# Patient Record
Sex: Female | Born: 1965 | Race: Black or African American | Hispanic: No | Marital: Single | Smoking: Current every day smoker
Health system: Southern US, Community
[De-identification: ages and names within clinical notes are randomized; demographics above are authoritative.]

## PROBLEM LIST (undated history)

## (undated) DIAGNOSIS — N951 Menopausal and female climacteric states: Secondary | ICD-10-CM

## (undated) DIAGNOSIS — K219 Gastro-esophageal reflux disease without esophagitis: Secondary | ICD-10-CM

## (undated) DIAGNOSIS — D7589 Other specified diseases of blood and blood-forming organs: Secondary | ICD-10-CM

## (undated) DIAGNOSIS — IMO0001 Reserved for inherently not codable concepts without codable children: Secondary | ICD-10-CM

## (undated) DIAGNOSIS — G43909 Migraine, unspecified, not intractable, without status migrainosus: Secondary | ICD-10-CM

## (undated) DIAGNOSIS — E063 Autoimmune thyroiditis: Secondary | ICD-10-CM

## (undated) DIAGNOSIS — R51 Headache: Secondary | ICD-10-CM

## (undated) DIAGNOSIS — Z720 Tobacco use: Secondary | ICD-10-CM

## (undated) DIAGNOSIS — I10 Essential (primary) hypertension: Secondary | ICD-10-CM

## (undated) DIAGNOSIS — Z8719 Personal history of other diseases of the digestive system: Secondary | ICD-10-CM

## (undated) DIAGNOSIS — K7460 Unspecified cirrhosis of liver: Secondary | ICD-10-CM

## (undated) DIAGNOSIS — M1990 Unspecified osteoarthritis, unspecified site: Secondary | ICD-10-CM

## (undated) DIAGNOSIS — K7581 Nonalcoholic steatohepatitis (NASH): Secondary | ICD-10-CM

## (undated) DIAGNOSIS — H269 Unspecified cataract: Secondary | ICD-10-CM

## (undated) DIAGNOSIS — K649 Unspecified hemorrhoids: Secondary | ICD-10-CM

## (undated) DIAGNOSIS — K754 Autoimmune hepatitis: Secondary | ICD-10-CM

## (undated) DIAGNOSIS — L700 Acne vulgaris: Secondary | ICD-10-CM

## (undated) HISTORY — DX: Gastro-esophageal reflux disease without esophagitis: K21.9

## (undated) HISTORY — PX: DILATION AND CURETTAGE OF UTERUS: SHX78

## (undated) HISTORY — DX: Other specified diseases of blood and blood-forming organs: D75.89

## (undated) HISTORY — DX: Tobacco use: Z72.0

## (undated) HISTORY — PX: ERCP: SHX60

## (undated) HISTORY — DX: Headache: R51

## (undated) HISTORY — PX: TUBAL LIGATION: SHX77

## (undated) HISTORY — DX: Acne vulgaris: L70.0

## (undated) HISTORY — DX: Menopausal and female climacteric states: N95.1

## (undated) HISTORY — DX: Essential (primary) hypertension: I10

## (undated) HISTORY — DX: Personal history of other diseases of the digestive system: Z87.19

## (undated) HISTORY — DX: Autoimmune thyroiditis: E06.3

## (undated) HISTORY — PX: CARPAL TUNNEL RELEASE: SHX101

## (undated) HISTORY — DX: Autoimmune hepatitis: K75.4

## (undated) HISTORY — DX: Unspecified osteoarthritis, unspecified site: M19.90

## (undated) HISTORY — DX: Unspecified hemorrhoids: K64.9

## (undated) HISTORY — DX: Unspecified cataract: H26.9

## (undated) HISTORY — PX: COLONOSCOPY: SHX174

---

## 1990-04-27 HISTORY — PX: CHOLECYSTECTOMY: SHX55

## 1998-06-25 ENCOUNTER — Encounter: Payer: Self-pay | Admitting: Neurology

## 1998-06-25 ENCOUNTER — Ambulatory Visit (HOSPITAL_COMMUNITY): Admission: RE | Admit: 1998-06-25 | Discharge: 1998-06-25 | Payer: Self-pay | Admitting: Neurology

## 1998-07-16 ENCOUNTER — Ambulatory Visit (HOSPITAL_COMMUNITY): Admission: RE | Admit: 1998-07-16 | Discharge: 1998-07-16 | Payer: Self-pay | Admitting: Neurology

## 1998-07-16 ENCOUNTER — Encounter: Payer: Self-pay | Admitting: Neurology

## 1999-05-14 ENCOUNTER — Encounter: Admission: RE | Admit: 1999-05-14 | Discharge: 1999-05-14 | Payer: Self-pay | Admitting: Internal Medicine

## 1999-05-17 ENCOUNTER — Emergency Department (HOSPITAL_COMMUNITY): Admission: EM | Admit: 1999-05-17 | Discharge: 1999-05-17 | Payer: Self-pay

## 1999-10-28 ENCOUNTER — Encounter: Admission: RE | Admit: 1999-10-28 | Discharge: 1999-10-28 | Payer: Self-pay

## 1999-11-25 ENCOUNTER — Encounter: Admission: RE | Admit: 1999-11-25 | Discharge: 1999-11-25 | Payer: Self-pay | Admitting: Obstetrics

## 2000-10-07 ENCOUNTER — Encounter: Admission: RE | Admit: 2000-10-07 | Discharge: 2000-10-07 | Payer: Self-pay | Admitting: Internal Medicine

## 2000-10-14 ENCOUNTER — Other Ambulatory Visit: Admission: RE | Admit: 2000-10-14 | Discharge: 2000-10-14 | Payer: Self-pay | Admitting: Sports Medicine

## 2000-10-14 ENCOUNTER — Encounter: Admission: RE | Admit: 2000-10-14 | Discharge: 2000-10-14 | Payer: Self-pay | Admitting: Internal Medicine

## 2001-09-14 ENCOUNTER — Encounter: Admission: RE | Admit: 2001-09-14 | Discharge: 2001-09-14 | Payer: Self-pay | Admitting: Internal Medicine

## 2001-09-16 ENCOUNTER — Ambulatory Visit (HOSPITAL_COMMUNITY): Admission: RE | Admit: 2001-09-16 | Discharge: 2001-09-16 | Payer: Self-pay | Admitting: Internal Medicine

## 2001-09-16 ENCOUNTER — Encounter: Payer: Self-pay | Admitting: Internal Medicine

## 2001-10-03 ENCOUNTER — Encounter: Admission: RE | Admit: 2001-10-03 | Discharge: 2001-10-03 | Payer: Self-pay | Admitting: Internal Medicine

## 2002-05-25 ENCOUNTER — Encounter: Admission: RE | Admit: 2002-05-25 | Discharge: 2002-05-25 | Payer: Self-pay | Admitting: Internal Medicine

## 2002-07-31 ENCOUNTER — Encounter: Admission: RE | Admit: 2002-07-31 | Discharge: 2002-07-31 | Payer: Self-pay | Admitting: Internal Medicine

## 2005-01-08 ENCOUNTER — Emergency Department (HOSPITAL_COMMUNITY): Admission: EM | Admit: 2005-01-08 | Discharge: 2005-01-09 | Payer: Self-pay | Admitting: Emergency Medicine

## 2005-01-28 ENCOUNTER — Ambulatory Visit (HOSPITAL_COMMUNITY): Admission: RE | Admit: 2005-01-28 | Discharge: 2005-01-28 | Payer: Self-pay | Admitting: Obstetrics

## 2005-05-05 ENCOUNTER — Ambulatory Visit: Payer: Self-pay | Admitting: Internal Medicine

## 2006-04-02 ENCOUNTER — Ambulatory Visit (HOSPITAL_COMMUNITY): Admission: RE | Admit: 2006-04-02 | Discharge: 2006-04-02 | Payer: Self-pay | Admitting: Internal Medicine

## 2006-04-13 DIAGNOSIS — K219 Gastro-esophageal reflux disease without esophagitis: Secondary | ICD-10-CM

## 2006-04-13 DIAGNOSIS — I10 Essential (primary) hypertension: Secondary | ICD-10-CM

## 2006-04-15 ENCOUNTER — Ambulatory Visit (HOSPITAL_COMMUNITY): Admission: RE | Admit: 2006-04-15 | Discharge: 2006-04-15 | Payer: Self-pay | Admitting: Obstetrics

## 2006-04-16 ENCOUNTER — Ambulatory Visit: Payer: Self-pay | Admitting: Internal Medicine

## 2006-04-30 DIAGNOSIS — R51 Headache: Secondary | ICD-10-CM | POA: Insufficient documentation

## 2006-04-30 DIAGNOSIS — R519 Headache, unspecified: Secondary | ICD-10-CM | POA: Insufficient documentation

## 2006-05-10 ENCOUNTER — Ambulatory Visit: Payer: Self-pay | Admitting: Gastroenterology

## 2006-05-10 LAB — CONVERTED CEMR LAB
AST: 27 units/L (ref 0–37)
Albumin: 4.1 g/dL (ref 3.5–5.2)
BUN: 13 mg/dL (ref 6–23)
CO2: 28 meq/L (ref 19–32)
Calcium: 9.3 mg/dL (ref 8.4–10.5)
Chloride: 106 meq/L (ref 96–112)
Creatinine, Ser: 1.1 mg/dL (ref 0.4–1.2)
Glucose, Bld: 96 mg/dL (ref 70–99)
HCT: 38.3 % (ref 36.0–46.0)
MCHC: 34.2 g/dL (ref 30.0–36.0)
MCV: 101.5 fL — ABNORMAL HIGH (ref 78.0–100.0)
Platelets: 405 10*3/uL — ABNORMAL HIGH (ref 150–400)
Total Protein: 7 g/dL (ref 6.0–8.3)
WBC: 7.8 10*3/uL (ref 4.5–10.5)

## 2006-06-07 ENCOUNTER — Ambulatory Visit: Payer: Self-pay | Admitting: Gastroenterology

## 2006-06-07 ENCOUNTER — Encounter (INDEPENDENT_AMBULATORY_CARE_PROVIDER_SITE_OTHER): Payer: Self-pay | Admitting: *Deleted

## 2006-07-15 ENCOUNTER — Ambulatory Visit: Payer: Self-pay | Admitting: Internal Medicine

## 2006-07-15 ENCOUNTER — Telehealth: Payer: Self-pay | Admitting: *Deleted

## 2006-07-15 DIAGNOSIS — N949 Unspecified condition associated with female genital organs and menstrual cycle: Secondary | ICD-10-CM

## 2006-07-15 DIAGNOSIS — N938 Other specified abnormal uterine and vaginal bleeding: Secondary | ICD-10-CM | POA: Insufficient documentation

## 2006-07-15 LAB — CONVERTED CEMR LAB
Alkaline Phosphatase: 60 units/L (ref 39–117)
Amphetamine Screen, Ur: NEGATIVE
Basophils Relative: 0 % (ref 0–1)
Benzodiazepines.: NEGATIVE
Beta hcg, urine, semiquantitative: NEGATIVE
Blood in Urine, dipstick: NEGATIVE
CO2: 30 meq/L (ref 19–32)
Creatinine, Ser: 0.89 mg/dL (ref 0.40–1.20)
Eosinophils Relative: 0 % (ref 0–5)
FSH: 71.3 milliintl units/mL
Glucose, Bld: 109 mg/dL — ABNORMAL HIGH (ref 70–99)
Glucose, Urine, Semiquant: NEGATIVE
HCT: 39.1 % (ref 36.0–46.0)
Ketones, urine, test strip: NEGATIVE
LH: 37.5 milliintl units/mL
Lymphs Abs: 1.1 10*3/uL (ref 0.7–3.3)
MCV: 100.3 fL — ABNORMAL HIGH (ref 78.0–100.0)
Marijuana Metabolite: POSITIVE — AB
Methadone: NEGATIVE
Monocytes Absolute: 0.3 10*3/uL (ref 0.2–0.7)
Monocytes Relative: 4 % (ref 3–11)
Neutrophils Relative %: 84 % — ABNORMAL HIGH (ref 43–77)
Nitrite: NEGATIVE
Opiates: NEGATIVE
Phencyclidine (PCP): NEGATIVE
RBC: 3.9 M/uL (ref 3.87–5.11)
RDW: 14 % (ref 11.5–14.0)
Sodium: 138 meq/L (ref 135–145)
Total Bilirubin: 0.8 mg/dL (ref 0.3–1.2)
Urobilinogen, UA: 0.2
WBC Urine, dipstick: NEGATIVE
pH: 7

## 2006-07-19 DIAGNOSIS — D539 Nutritional anemia, unspecified: Secondary | ICD-10-CM | POA: Insufficient documentation

## 2006-07-19 DIAGNOSIS — E0590 Thyrotoxicosis, unspecified without thyrotoxic crisis or storm: Secondary | ICD-10-CM | POA: Insufficient documentation

## 2006-07-29 ENCOUNTER — Encounter (INDEPENDENT_AMBULATORY_CARE_PROVIDER_SITE_OTHER): Payer: Self-pay | Admitting: Internal Medicine

## 2006-07-29 ENCOUNTER — Ambulatory Visit: Payer: Self-pay | Admitting: *Deleted

## 2006-07-29 DIAGNOSIS — N951 Menopausal and female climacteric states: Secondary | ICD-10-CM

## 2006-07-29 LAB — CONVERTED CEMR LAB: Thyroglobulin Ab: <30 (ref 0.0–60.0)

## 2006-07-30 ENCOUNTER — Telehealth: Payer: Self-pay | Admitting: *Deleted

## 2006-08-09 ENCOUNTER — Encounter (HOSPITAL_COMMUNITY): Admission: RE | Admit: 2006-08-09 | Discharge: 2006-08-10 | Payer: Self-pay | Admitting: *Deleted

## 2006-08-17 ENCOUNTER — Encounter (INDEPENDENT_AMBULATORY_CARE_PROVIDER_SITE_OTHER): Payer: Self-pay | Admitting: *Deleted

## 2006-08-17 DIAGNOSIS — E063 Autoimmune thyroiditis: Secondary | ICD-10-CM | POA: Insufficient documentation

## 2006-08-18 ENCOUNTER — Encounter (INDEPENDENT_AMBULATORY_CARE_PROVIDER_SITE_OTHER): Payer: Self-pay | Admitting: Internal Medicine

## 2006-09-27 ENCOUNTER — Emergency Department (HOSPITAL_COMMUNITY): Admission: EM | Admit: 2006-09-27 | Discharge: 2006-09-27 | Payer: Self-pay | Admitting: Emergency Medicine

## 2006-09-30 ENCOUNTER — Encounter (INDEPENDENT_AMBULATORY_CARE_PROVIDER_SITE_OTHER): Payer: Self-pay | Admitting: Pulmonary Disease

## 2006-09-30 ENCOUNTER — Ambulatory Visit: Payer: Self-pay | Admitting: Internal Medicine

## 2006-09-30 LAB — CONVERTED CEMR LAB
Basophils Absolute: 0 10*3/uL (ref 0.0–0.1)
Basophils Relative: 1 % (ref 0–1)
Eosinophils Absolute: 0.1 10*3/uL (ref 0.0–0.7)
HCT: 39.1 % (ref 36.0–46.0)
Hemoglobin: 12.4 g/dL (ref 12.0–15.0)
MCHC: 31.7 g/dL (ref 30.0–36.0)
MCV: 102.6 fL — ABNORMAL HIGH (ref 78.0–100.0)
Monocytes Absolute: 0.7 10*3/uL (ref 0.2–0.7)
Platelets: 408 10*3/uL — ABNORMAL HIGH (ref 150–400)
RBC: 3.81 M/uL — ABNORMAL LOW (ref 3.87–5.11)
RDW: 14.5 % — ABNORMAL HIGH (ref 11.5–14.0)

## 2007-03-06 ENCOUNTER — Emergency Department (HOSPITAL_COMMUNITY): Admission: EM | Admit: 2007-03-06 | Discharge: 2007-03-06 | Payer: Self-pay | Admitting: Emergency Medicine

## 2007-04-14 ENCOUNTER — Emergency Department (HOSPITAL_COMMUNITY): Admission: EM | Admit: 2007-04-14 | Discharge: 2007-04-14 | Payer: Self-pay | Admitting: Emergency Medicine

## 2007-06-02 ENCOUNTER — Ambulatory Visit (HOSPITAL_COMMUNITY): Admission: RE | Admit: 2007-06-02 | Discharge: 2007-06-02 | Payer: Self-pay | Admitting: Obstetrics

## 2007-06-08 ENCOUNTER — Encounter: Payer: Self-pay | Admitting: Gastroenterology

## 2007-09-19 ENCOUNTER — Emergency Department (HOSPITAL_COMMUNITY): Admission: EM | Admit: 2007-09-19 | Discharge: 2007-09-19 | Payer: Self-pay | Admitting: Family Medicine

## 2009-01-23 ENCOUNTER — Emergency Department (HOSPITAL_COMMUNITY): Admission: EM | Admit: 2009-01-23 | Discharge: 2009-01-23 | Payer: Self-pay | Admitting: Family Medicine

## 2009-01-23 ENCOUNTER — Ambulatory Visit: Payer: Self-pay | Admitting: Internal Medicine

## 2009-01-24 ENCOUNTER — Inpatient Hospital Stay (HOSPITAL_COMMUNITY): Admission: EM | Admit: 2009-01-24 | Discharge: 2009-01-25 | Payer: Self-pay | Admitting: Emergency Medicine

## 2009-01-24 ENCOUNTER — Encounter (INDEPENDENT_AMBULATORY_CARE_PROVIDER_SITE_OTHER): Payer: Self-pay | Admitting: *Deleted

## 2009-01-25 ENCOUNTER — Encounter (INDEPENDENT_AMBULATORY_CARE_PROVIDER_SITE_OTHER): Payer: Self-pay | Admitting: *Deleted

## 2009-03-08 ENCOUNTER — Ambulatory Visit: Payer: Self-pay | Admitting: Gastroenterology

## 2009-03-08 DIAGNOSIS — R740 Nonspecific elevation of levels of transaminase and lactic acid dehydrogenase [LDH]: Secondary | ICD-10-CM

## 2009-03-08 LAB — CONVERTED CEMR LAB
AST: 512 units/L — ABNORMAL HIGH (ref 0–37)
Alkaline Phosphatase: 292 units/L — ABNORMAL HIGH (ref 39–117)
BUN: 6 mg/dL (ref 6–23)
CO2: 29 meq/L (ref 19–32)
Chloride: 104 meq/L (ref 96–112)
Creatinine, Ser: 0.8 mg/dL (ref 0.4–1.2)
GFR calc non Af Amer: 100.45 mL/min (ref >60)
Glucose, Bld: 111 mg/dL — ABNORMAL HIGH (ref 70–99)
HCT: 39.4 % (ref 36.0–46.0)
Lymphocytes Relative: 35.9 % (ref 12.0–46.0)
MCHC: 34.3 g/dL (ref 30.0–36.0)
Monocytes Relative: 7.7 % (ref 3.0–12.0)
RBC: 3.63 M/uL — ABNORMAL LOW (ref 3.87–5.11)
Total Bilirubin: 5.4 mg/dL — ABNORMAL HIGH (ref 0.3–1.2)
Total Protein: 8.2 g/dL (ref 6.0–8.3)
WBC: 6.5 10*3/uL (ref 4.5–10.5)

## 2009-03-11 LAB — CONVERTED CEMR LAB
Hep B C IgM: NEGATIVE
Hep B S Ab: NEGATIVE
Hepatitis B Surface Ag: NEGATIVE

## 2009-03-12 ENCOUNTER — Ambulatory Visit (HOSPITAL_COMMUNITY): Admission: RE | Admit: 2009-03-12 | Discharge: 2009-03-12 | Payer: Self-pay | Admitting: Gastroenterology

## 2009-03-18 ENCOUNTER — Ambulatory Visit: Payer: Self-pay | Admitting: Gastroenterology

## 2009-03-18 ENCOUNTER — Telehealth: Payer: Self-pay | Admitting: Gastroenterology

## 2009-03-18 LAB — CONVERTED CEMR LAB
AST: 636 units/L — ABNORMAL HIGH (ref 0–37)
Albumin: 2.9 g/dL — ABNORMAL LOW (ref 3.5–5.2)
Bilirubin, Direct: 4.4 mg/dL — ABNORMAL HIGH (ref 0.0–0.3)
Prothrombin Time: 13.2 s — ABNORMAL HIGH (ref 9.1–11.7)

## 2009-04-04 ENCOUNTER — Encounter (INDEPENDENT_AMBULATORY_CARE_PROVIDER_SITE_OTHER): Payer: Self-pay | Admitting: *Deleted

## 2009-04-04 ENCOUNTER — Ambulatory Visit (HOSPITAL_COMMUNITY): Admission: RE | Admit: 2009-04-04 | Discharge: 2009-04-04 | Payer: Self-pay | Admitting: Gastroenterology

## 2009-04-09 ENCOUNTER — Telehealth: Payer: Self-pay | Admitting: Gastroenterology

## 2009-04-12 ENCOUNTER — Ambulatory Visit: Payer: Self-pay | Admitting: Gastroenterology

## 2009-04-12 LAB — CONVERTED CEMR LAB
Albumin: 2.8 g/dL — ABNORMAL LOW (ref 3.5–5.2)
Alkaline Phosphatase: 219 units/L — ABNORMAL HIGH (ref 39–117)
Bilirubin, Direct: 1.4 mg/dL — ABNORMAL HIGH (ref 0.0–0.3)

## 2009-04-16 ENCOUNTER — Telehealth: Payer: Self-pay | Admitting: Gastroenterology

## 2009-05-03 ENCOUNTER — Telehealth: Payer: Self-pay | Admitting: Gastroenterology

## 2009-05-03 DIAGNOSIS — K7460 Unspecified cirrhosis of liver: Secondary | ICD-10-CM | POA: Insufficient documentation

## 2009-05-04 ENCOUNTER — Emergency Department (HOSPITAL_COMMUNITY): Admission: EM | Admit: 2009-05-04 | Discharge: 2009-05-04 | Payer: Self-pay | Admitting: Emergency Medicine

## 2009-05-06 ENCOUNTER — Telehealth: Payer: Self-pay | Admitting: Gastroenterology

## 2009-05-08 ENCOUNTER — Ambulatory Visit: Payer: Self-pay | Admitting: Gastroenterology

## 2009-05-09 LAB — CONVERTED CEMR LAB
AST: 71 units/L — ABNORMAL HIGH (ref 0–37)
Alkaline Phosphatase: 148 units/L — ABNORMAL HIGH (ref 39–117)
Total Bilirubin: 1.5 mg/dL — ABNORMAL HIGH (ref 0.3–1.2)

## 2009-05-10 ENCOUNTER — Ambulatory Visit: Payer: Self-pay | Admitting: Gastroenterology

## 2009-05-20 ENCOUNTER — Telehealth: Payer: Self-pay | Admitting: Gastroenterology

## 2009-05-21 ENCOUNTER — Ambulatory Visit: Payer: Self-pay | Admitting: Gastroenterology

## 2009-05-23 LAB — CONVERTED CEMR LAB
Alkaline Phosphatase: 135 units/L — ABNORMAL HIGH (ref 39–117)
Basophils Absolute: 0 10*3/uL (ref 0.0–0.1)
Eosinophils Relative: 2.9 % (ref 0.0–5.0)
Glucose, Bld: 94 mg/dL (ref 70–99)
HCT: 40.6 % (ref 36.0–46.0)
Lymphs Abs: 1.2 10*3/uL (ref 0.7–4.0)
MCV: 111.9 fL — ABNORMAL HIGH (ref 78.0–100.0)
Monocytes Absolute: 0.6 10*3/uL (ref 0.1–1.0)
Platelets: 264 10*3/uL (ref 150.0–400.0)
RDW: 14.4 % (ref 11.5–14.6)
Sodium: 141 meq/L (ref 135–145)
Total Bilirubin: 1.4 mg/dL — ABNORMAL HIGH (ref 0.3–1.2)
Total Protein: 7 g/dL (ref 6.0–8.3)

## 2009-05-24 ENCOUNTER — Telehealth: Payer: Self-pay | Admitting: Gastroenterology

## 2009-05-28 ENCOUNTER — Ambulatory Visit: Payer: Self-pay | Admitting: Gastroenterology

## 2009-05-28 ENCOUNTER — Emergency Department (HOSPITAL_COMMUNITY): Admission: EM | Admit: 2009-05-28 | Discharge: 2009-05-28 | Payer: Self-pay | Admitting: Emergency Medicine

## 2009-05-28 LAB — CONVERTED CEMR LAB
Albumin: 3.5 g/dL (ref 3.5–5.2)
Basophils Absolute: 0 10*3/uL (ref 0.0–0.1)
CO2: 31 meq/L (ref 19–32)
Calcium: 9.1 mg/dL (ref 8.4–10.5)
Chloride: 103 meq/L (ref 96–112)
Eosinophils Relative: 3.6 % (ref 0.0–5.0)
GFR calc non Af Amer: 100.35 mL/min (ref >60)
Glucose, Bld: 103 mg/dL — ABNORMAL HIGH (ref 70–99)
Lymphocytes Relative: 35.5 % (ref 12.0–46.0)
Monocytes Relative: 7.7 % (ref 3.0–12.0)
Neutrophils Relative %: 53.2 % (ref 43.0–77.0)
Platelets: 247 10*3/uL (ref 150.0–400.0)
Potassium: 3.5 meq/L (ref 3.5–5.1)
Sodium: 141 meq/L (ref 135–145)
Total Bilirubin: 1.7 mg/dL — ABNORMAL HIGH (ref 0.3–1.2)
Total Protein: 7.3 g/dL (ref 6.0–8.3)
WBC: 5.9 10*3/uL (ref 4.5–10.5)

## 2009-06-03 ENCOUNTER — Telehealth (INDEPENDENT_AMBULATORY_CARE_PROVIDER_SITE_OTHER): Payer: Self-pay | Admitting: *Deleted

## 2009-06-05 ENCOUNTER — Telehealth: Payer: Self-pay | Admitting: Gastroenterology

## 2009-06-06 ENCOUNTER — Ambulatory Visit: Payer: Self-pay | Admitting: Gastroenterology

## 2009-06-06 ENCOUNTER — Telehealth (INDEPENDENT_AMBULATORY_CARE_PROVIDER_SITE_OTHER): Payer: Self-pay | Admitting: *Deleted

## 2009-06-06 LAB — CONVERTED CEMR LAB
AST: 53 units/L — ABNORMAL HIGH (ref 0–37)
Alkaline Phosphatase: 119 units/L — ABNORMAL HIGH (ref 39–117)
BUN: 10 mg/dL (ref 6–23)
Basophils Relative: 1.1 % (ref 0.0–3.0)
Calcium: 9 mg/dL (ref 8.4–10.5)
Chloride: 105 meq/L (ref 96–112)
Creatinine, Ser: 0.8 mg/dL (ref 0.4–1.2)
Eosinophils Absolute: 0.1 10*3/uL (ref 0.0–0.7)
HCT: 38.9 % (ref 36.0–46.0)
Hemoglobin: 12.8 g/dL (ref 12.0–15.0)
Lymphocytes Relative: 12 % (ref 12.0–46.0)
MCHC: 32.9 g/dL (ref 30.0–36.0)
Neutro Abs: 5.9 10*3/uL (ref 1.4–7.7)
RBC: 3.49 M/uL — ABNORMAL LOW (ref 3.87–5.11)
Sodium: 141 meq/L (ref 135–145)
Total Bilirubin: 1.3 mg/dL — ABNORMAL HIGH (ref 0.3–1.2)

## 2009-06-07 ENCOUNTER — Telehealth: Payer: Self-pay | Admitting: Gastroenterology

## 2009-06-17 ENCOUNTER — Ambulatory Visit: Payer: Self-pay | Admitting: Gastroenterology

## 2009-06-17 LAB — CONVERTED CEMR LAB
AST: 45 units/L — ABNORMAL HIGH (ref 0–37)
Albumin: 3.5 g/dL (ref 3.5–5.2)
Alkaline Phosphatase: 99 units/L (ref 39–117)
Total Protein: 7.1 g/dL (ref 6.0–8.3)

## 2009-06-18 ENCOUNTER — Encounter: Payer: Self-pay | Admitting: Internal Medicine

## 2009-06-18 ENCOUNTER — Ambulatory Visit: Payer: Self-pay | Admitting: Internal Medicine

## 2009-06-18 LAB — HM PAP SMEAR: HM Pap smear: NEGATIVE

## 2009-06-25 ENCOUNTER — Encounter: Payer: Self-pay | Admitting: Gastroenterology

## 2009-06-27 ENCOUNTER — Telehealth: Payer: Self-pay | Admitting: Gastroenterology

## 2009-07-01 ENCOUNTER — Ambulatory Visit (HOSPITAL_COMMUNITY): Admission: RE | Admit: 2009-07-01 | Discharge: 2009-07-01 | Payer: Self-pay | Admitting: Internal Medicine

## 2009-07-01 LAB — HM MAMMOGRAPHY: HM Mammogram: NEGATIVE

## 2009-07-08 ENCOUNTER — Telehealth: Payer: Self-pay | Admitting: Gastroenterology

## 2009-07-26 ENCOUNTER — Ambulatory Visit: Payer: Self-pay | Admitting: Gastroenterology

## 2009-07-26 LAB — CONVERTED CEMR LAB
AST: 43 units/L — ABNORMAL HIGH (ref 0–37)
Alkaline Phosphatase: 76 units/L (ref 39–117)
BUN: 12 mg/dL (ref 6–23)
Creatinine, Ser: 0.9 mg/dL (ref 0.4–1.2)
Potassium: 3.4 meq/L — ABNORMAL LOW (ref 3.5–5.1)

## 2009-08-07 ENCOUNTER — Telehealth: Payer: Self-pay | Admitting: Gastroenterology

## 2009-08-19 ENCOUNTER — Ambulatory Visit: Payer: Self-pay | Admitting: Internal Medicine

## 2009-08-19 ENCOUNTER — Telehealth: Payer: Self-pay | Admitting: Gastroenterology

## 2009-08-19 DIAGNOSIS — M79609 Pain in unspecified limb: Secondary | ICD-10-CM

## 2009-08-20 LAB — CONVERTED CEMR LAB
Albumin: 4 g/dL (ref 3.5–5.2)
BUN: 12 mg/dL (ref 6–23)
Calcium: 9.3 mg/dL (ref 8.4–10.5)
Chloride: 103 meq/L (ref 96–112)
Glucose, Bld: 83 mg/dL (ref 70–99)
Potassium: 3.7 meq/L (ref 3.5–5.3)
TSH: 0.366 microintl units/mL (ref 0.350–4.5)

## 2009-08-23 ENCOUNTER — Telehealth: Payer: Self-pay | Admitting: Gastroenterology

## 2009-08-29 ENCOUNTER — Telehealth: Payer: Self-pay | Admitting: Gastroenterology

## 2009-08-31 ENCOUNTER — Emergency Department (HOSPITAL_BASED_OUTPATIENT_CLINIC_OR_DEPARTMENT_OTHER): Admission: EM | Admit: 2009-08-31 | Discharge: 2009-08-31 | Payer: Self-pay | Admitting: Emergency Medicine

## 2009-09-13 ENCOUNTER — Telehealth: Payer: Self-pay | Admitting: Gastroenterology

## 2009-09-18 ENCOUNTER — Telehealth (INDEPENDENT_AMBULATORY_CARE_PROVIDER_SITE_OTHER): Payer: Self-pay | Admitting: *Deleted

## 2009-09-18 ENCOUNTER — Ambulatory Visit: Payer: Self-pay | Admitting: Gastroenterology

## 2009-09-19 LAB — CONVERTED CEMR LAB
ALT: 38 units/L — ABNORMAL HIGH (ref 0–35)
Alkaline Phosphatase: 72 units/L (ref 39–117)
Eosinophils Relative: 0.1 % (ref 0.0–5.0)
Glucose, Bld: 154 mg/dL — ABNORMAL HIGH (ref 70–99)
HCT: 39.8 % (ref 36.0–46.0)
Hemoglobin: 13.3 g/dL (ref 12.0–15.0)
Lymphs Abs: 0.8 10*3/uL (ref 0.7–4.0)
MCV: 109.3 fL — ABNORMAL HIGH (ref 78.0–100.0)
Monocytes Absolute: 0.3 10*3/uL (ref 0.1–1.0)
Neutro Abs: 6.5 10*3/uL (ref 1.4–7.7)
Platelets: 285 10*3/uL (ref 150.0–400.0)
Sodium: 143 meq/L (ref 135–145)
Total Bilirubin: 1.4 mg/dL — ABNORMAL HIGH (ref 0.3–1.2)
Total Protein: 6.7 g/dL (ref 6.0–8.3)
WBC: 7.7 10*3/uL (ref 4.5–10.5)

## 2009-09-25 ENCOUNTER — Telehealth (INDEPENDENT_AMBULATORY_CARE_PROVIDER_SITE_OTHER): Payer: Self-pay | Admitting: *Deleted

## 2009-10-07 ENCOUNTER — Telehealth (INDEPENDENT_AMBULATORY_CARE_PROVIDER_SITE_OTHER): Payer: Self-pay | Admitting: *Deleted

## 2009-10-08 ENCOUNTER — Ambulatory Visit: Payer: Self-pay | Admitting: Gastroenterology

## 2009-10-08 LAB — CONVERTED CEMR LAB
ALT: 42 units/L — ABNORMAL HIGH (ref 0–35)
AST: 44 units/L — ABNORMAL HIGH (ref 0–37)
Albumin: 3.7 g/dL (ref 3.5–5.2)
Alkaline Phosphatase: 75 units/L (ref 39–117)
Basophils Absolute: 0 10*3/uL (ref 0.0–0.1)
Eosinophils Absolute: 0.2 10*3/uL (ref 0.0–0.7)
Eosinophils Relative: 2.4 % (ref 0.0–5.0)
Lymphs Abs: 2.3 10*3/uL (ref 0.7–4.0)
MCHC: 34.3 g/dL (ref 30.0–36.0)
MCV: 107.4 fL — ABNORMAL HIGH (ref 78.0–100.0)
Monocytes Absolute: 1 10*3/uL (ref 0.1–1.0)
Neutrophils Relative %: 57.9 % (ref 43.0–77.0)
Platelets: 264 10*3/uL (ref 150.0–400.0)
Potassium: 3.6 meq/L (ref 3.5–5.1)
RDW: 14.2 % (ref 11.5–14.6)
Sodium: 141 meq/L (ref 135–145)
Total Bilirubin: 1 mg/dL (ref 0.3–1.2)
Total Protein: 6.4 g/dL (ref 6.0–8.3)
WBC: 8.5 10*3/uL (ref 4.5–10.5)

## 2009-10-24 ENCOUNTER — Telehealth: Payer: Self-pay | Admitting: Gastroenterology

## 2009-11-07 ENCOUNTER — Ambulatory Visit: Payer: Self-pay | Admitting: Internal Medicine

## 2009-11-07 ENCOUNTER — Telehealth: Payer: Self-pay | Admitting: Gastroenterology

## 2009-11-07 ENCOUNTER — Ambulatory Visit (HOSPITAL_COMMUNITY): Admission: RE | Admit: 2009-11-07 | Discharge: 2009-11-07 | Payer: Self-pay | Admitting: Internal Medicine

## 2009-11-07 ENCOUNTER — Telehealth: Payer: Self-pay | Admitting: Internal Medicine

## 2009-11-15 ENCOUNTER — Telehealth: Payer: Self-pay | Admitting: *Deleted

## 2009-11-18 ENCOUNTER — Telehealth: Payer: Self-pay | Admitting: Internal Medicine

## 2009-11-19 ENCOUNTER — Telehealth: Payer: Self-pay | Admitting: *Deleted

## 2009-11-24 ENCOUNTER — Emergency Department (HOSPITAL_BASED_OUTPATIENT_CLINIC_OR_DEPARTMENT_OTHER): Admission: EM | Admit: 2009-11-24 | Discharge: 2009-11-25 | Payer: Self-pay | Admitting: Emergency Medicine

## 2009-11-26 ENCOUNTER — Ambulatory Visit: Payer: Self-pay | Admitting: Gastroenterology

## 2009-11-26 LAB — CONVERTED CEMR LAB: TSH: 0.28 microintl units/mL — ABNORMAL LOW (ref 0.35–5.50)

## 2009-11-28 ENCOUNTER — Ambulatory Visit: Payer: Self-pay | Admitting: Internal Medicine

## 2009-11-28 ENCOUNTER — Telehealth: Payer: Self-pay | Admitting: Gastroenterology

## 2009-11-29 ENCOUNTER — Ambulatory Visit: Payer: Self-pay | Admitting: Internal Medicine

## 2009-11-29 LAB — CONVERTED CEMR LAB
AST: 204 units/L — ABNORMAL HIGH (ref 0–37)
Albumin: 3.7 g/dL (ref 3.5–5.2)
Alkaline Phosphatase: 135 units/L — ABNORMAL HIGH (ref 39–117)
BUN: 20 mg/dL (ref 6–23)
Basophils Relative: 0.7 % (ref 0.0–3.0)
Eosinophils Absolute: 0.2 10*3/uL (ref 0.0–0.7)
Free T4: 0.98 ng/dL (ref 0.80–1.80)
Glucose, Bld: 111 mg/dL — ABNORMAL HIGH (ref 70–99)
HCT: 37.6 % (ref 36.0–46.0)
Lymphs Abs: 1.7 10*3/uL (ref 0.7–4.0)
MCHC: 34 g/dL (ref 30.0–36.0)
MCV: 104.8 fL — ABNORMAL HIGH (ref 78.0–100.0)
Monocytes Absolute: 0.8 10*3/uL (ref 0.1–1.0)
Neutrophils Relative %: 61.6 % (ref 43.0–77.0)
Platelets: 267 10*3/uL (ref 150.0–400.0)
Potassium: 3.5 meq/L (ref 3.5–5.1)
Sodium: 145 meq/L (ref 135–145)
Total Bilirubin: 1.5 mg/dL — ABNORMAL HIGH (ref 0.3–1.2)

## 2009-12-01 DIAGNOSIS — R5383 Other fatigue: Secondary | ICD-10-CM

## 2009-12-01 DIAGNOSIS — R5381 Other malaise: Secondary | ICD-10-CM | POA: Insufficient documentation

## 2009-12-05 ENCOUNTER — Telehealth: Payer: Self-pay | Admitting: *Deleted

## 2009-12-16 ENCOUNTER — Telehealth: Payer: Self-pay | Admitting: Internal Medicine

## 2009-12-16 ENCOUNTER — Telehealth (INDEPENDENT_AMBULATORY_CARE_PROVIDER_SITE_OTHER): Payer: Self-pay | Admitting: *Deleted

## 2009-12-20 ENCOUNTER — Ambulatory Visit: Payer: Self-pay | Admitting: Gastroenterology

## 2009-12-24 ENCOUNTER — Telehealth: Payer: Self-pay | Admitting: Gastroenterology

## 2009-12-24 LAB — CONVERTED CEMR LAB
AST: 279 units/L — ABNORMAL HIGH (ref 0–37)
Alkaline Phosphatase: 141 units/L — ABNORMAL HIGH (ref 39–117)
Bilirubin, Direct: 0.5 mg/dL — ABNORMAL HIGH (ref 0.0–0.3)
Total Bilirubin: 1.9 mg/dL — ABNORMAL HIGH (ref 0.3–1.2)

## 2009-12-26 ENCOUNTER — Encounter: Payer: Self-pay | Admitting: Gastroenterology

## 2009-12-27 ENCOUNTER — Ambulatory Visit: Payer: Self-pay | Admitting: Internal Medicine

## 2009-12-27 DIAGNOSIS — L708 Other acne: Secondary | ICD-10-CM

## 2009-12-31 ENCOUNTER — Telehealth: Payer: Self-pay | Admitting: Gastroenterology

## 2010-01-01 ENCOUNTER — Telehealth: Payer: Self-pay | Admitting: Internal Medicine

## 2010-01-07 ENCOUNTER — Encounter: Payer: Self-pay | Admitting: Gastroenterology

## 2010-01-14 ENCOUNTER — Telehealth: Payer: Self-pay | Admitting: Gastroenterology

## 2010-01-15 ENCOUNTER — Emergency Department (HOSPITAL_COMMUNITY): Admission: EM | Admit: 2010-01-15 | Discharge: 2010-01-15 | Payer: Self-pay | Admitting: Emergency Medicine

## 2010-01-24 ENCOUNTER — Telehealth (INDEPENDENT_AMBULATORY_CARE_PROVIDER_SITE_OTHER): Payer: Self-pay | Admitting: *Deleted

## 2010-01-27 ENCOUNTER — Encounter: Payer: Self-pay | Admitting: Internal Medicine

## 2010-01-27 ENCOUNTER — Ambulatory Visit: Payer: Self-pay | Admitting: Gastroenterology

## 2010-01-28 ENCOUNTER — Ambulatory Visit: Payer: Self-pay | Admitting: Gastroenterology

## 2010-01-28 LAB — CONVERTED CEMR LAB
ALT: 159 units/L — ABNORMAL HIGH (ref 0–35)
AST: 214 units/L — ABNORMAL HIGH (ref 0–37)
Albumin: 3.6 g/dL (ref 3.5–5.2)
Alkaline Phosphatase: 119 units/L — ABNORMAL HIGH (ref 39–117)
Total Protein: 7.1 g/dL (ref 6.0–8.3)

## 2010-01-29 ENCOUNTER — Encounter: Payer: Self-pay | Admitting: Gastroenterology

## 2010-01-30 ENCOUNTER — Ambulatory Visit: Payer: Self-pay | Admitting: Internal Medicine

## 2010-02-11 ENCOUNTER — Telehealth (INDEPENDENT_AMBULATORY_CARE_PROVIDER_SITE_OTHER): Payer: Self-pay | Admitting: *Deleted

## 2010-02-12 ENCOUNTER — Ambulatory Visit (HOSPITAL_COMMUNITY): Admission: RE | Admit: 2010-02-12 | Discharge: 2010-02-12 | Payer: Self-pay | Admitting: Gastroenterology

## 2010-02-18 ENCOUNTER — Telehealth: Payer: Self-pay | Admitting: Internal Medicine

## 2010-02-19 ENCOUNTER — Telehealth: Payer: Self-pay | Admitting: Internal Medicine

## 2010-02-19 ENCOUNTER — Ambulatory Visit: Payer: Self-pay | Admitting: Gastroenterology

## 2010-02-20 LAB — CONVERTED CEMR LAB
AST: 564 units/L — ABNORMAL HIGH (ref 0–37)
Albumin: 3.1 g/dL — ABNORMAL LOW (ref 3.5–5.2)
Total Bilirubin: 1.9 mg/dL — ABNORMAL HIGH (ref 0.3–1.2)

## 2010-02-24 ENCOUNTER — Encounter: Payer: Self-pay | Admitting: Gastroenterology

## 2010-02-27 ENCOUNTER — Encounter: Payer: Self-pay | Admitting: Gastroenterology

## 2010-03-24 ENCOUNTER — Encounter: Payer: Self-pay | Admitting: Gastroenterology

## 2010-04-14 ENCOUNTER — Encounter: Payer: Self-pay | Admitting: Gastroenterology

## 2010-04-15 ENCOUNTER — Ambulatory Visit: Payer: Self-pay

## 2010-04-29 ENCOUNTER — Encounter: Payer: Self-pay | Admitting: Gastroenterology

## 2010-05-08 ENCOUNTER — Ambulatory Visit: Admit: 2010-05-08 | Payer: Self-pay

## 2010-05-18 ENCOUNTER — Encounter: Payer: Self-pay | Admitting: Internal Medicine

## 2010-05-18 ENCOUNTER — Encounter: Payer: Self-pay | Admitting: Obstetrics

## 2010-05-20 ENCOUNTER — Emergency Department (HOSPITAL_COMMUNITY)
Admission: EM | Admit: 2010-05-20 | Discharge: 2010-05-20 | Payer: Self-pay | Source: Home / Self Care | Admitting: Family Medicine

## 2010-05-27 ENCOUNTER — Ambulatory Visit
Admission: RE | Admit: 2010-05-27 | Discharge: 2010-05-27 | Payer: Self-pay | Source: Home / Self Care | Attending: Gastroenterology | Admitting: Gastroenterology

## 2010-05-27 ENCOUNTER — Other Ambulatory Visit: Payer: Self-pay | Admitting: Gastroenterology

## 2010-05-27 LAB — HEPATIC FUNCTION PANEL
Albumin: 3.4 g/dL — ABNORMAL LOW (ref 3.5–5.2)
Total Protein: 7.8 g/dL (ref 6.0–8.3)

## 2010-05-29 ENCOUNTER — Ambulatory Visit (INDEPENDENT_AMBULATORY_CARE_PROVIDER_SITE_OTHER): Payer: Medicaid Other | Admitting: Internal Medicine

## 2010-05-29 ENCOUNTER — Encounter: Payer: Self-pay | Admitting: Gastroenterology

## 2010-05-29 ENCOUNTER — Encounter: Payer: Self-pay | Admitting: Internal Medicine

## 2010-05-29 VITALS — BP 143/83 | HR 80 | Temp 98.2°F | Ht 61.5 in | Wt 162.4 lb

## 2010-05-29 DIAGNOSIS — I10 Essential (primary) hypertension: Secondary | ICD-10-CM

## 2010-05-29 DIAGNOSIS — R202 Paresthesia of skin: Secondary | ICD-10-CM | POA: Insufficient documentation

## 2010-05-29 DIAGNOSIS — R200 Anesthesia of skin: Secondary | ICD-10-CM | POA: Insufficient documentation

## 2010-05-29 DIAGNOSIS — R209 Unspecified disturbances of skin sensation: Secondary | ICD-10-CM

## 2010-05-29 DIAGNOSIS — D539 Nutritional anemia, unspecified: Secondary | ICD-10-CM

## 2010-05-29 LAB — CBC WITH DIFFERENTIAL/PLATELET
Basophils Absolute: 0 10*3/uL (ref 0.0–0.1)
Basophils Relative: 0 % (ref 0–1)
Eosinophils Absolute: 0 10*3/uL (ref 0.0–0.7)
Eosinophils Relative: 0 % (ref 0–5)
HCT: 35.5 % — ABNORMAL LOW (ref 36.0–46.0)
Hemoglobin: 12.8 g/dL (ref 12.0–15.0)
MCH: 35.4 pg — ABNORMAL HIGH (ref 26.0–34.0)
MCHC: 36.1 g/dL — ABNORMAL HIGH (ref 30.0–36.0)
MCV: 98.1 fL (ref 78.0–100.0)
Monocytes Absolute: 0.3 10*3/uL (ref 0.1–1.0)
Monocytes Relative: 6 % (ref 3–12)
RDW: 14.8 % (ref 11.5–15.5)

## 2010-05-29 LAB — BASIC METABOLIC PANEL
BUN: 10 mg/dL (ref 6–23)
Calcium: 9.2 mg/dL (ref 8.4–10.5)
Creat: 0.91 mg/dL (ref 0.40–1.20)
Glucose, Bld: 141 mg/dL — ABNORMAL HIGH (ref 70–99)
Potassium: 3.7 mEq/L (ref 3.5–5.3)

## 2010-05-29 LAB — VITAMIN B12: Vitamin B-12: 1995 pg/mL — ABNORMAL HIGH (ref 211–911)

## 2010-05-29 NOTE — Progress Notes (Signed)
Summary: sooner appt   Phone Note Call from Patient Call back at Home Phone [phone]   Caller: Patient Call For: Dr Christella Hartigan Reason for Call: Talk to Nurse Summary of Call: Patient wants to speak to nurse because she wants to be seen sooner than first avaiable 11-4 for elevated blood work per her pcp. Initial call taken by: Tawni Levy,  January 14, 2010 10:30 AM  Follow-up for Phone Call        Patient  was seen at [facility] by Dr Burtis Junes last week.  he asked her to make a follow up appointment with Dr Christella Hartigan and will need follow up lab work.  They did restart her on prednisone.  Dr Christella Hartigan have you seen the dicatiation from [facility]?  Your next RN approval is 02/19/10.  Do you want me to put her there, or do you want to work her in?  Please advise Follow-up by: Darcey Nora RN, CGRN,  January 14, 2010 10:51 AM  Additional Follow-up for Phone Call Additional follow up Details #1::        work her in for appt in 7-10 days, needs LFTs just prior Additional Follow-up by: Rachael Fee MD,  January 14, 2010 10:52 AM    Additional Follow-up for Phone Call Additional follow up Details #2::    Patient  aware, REV scheduled for 01/28/10 1:30.  She will come for LFTs 01/24/10 or 01/27/10. Follow-up by: Darcey Nora RN, CGRN,  January 14, 2010 11:01 AM

## 2010-05-29 NOTE — Assessment & Plan Note (Signed)
Summary: RE ESTABLISH CARE/CFB   Vital Signs:  Patient profile:   45 year old female Height:      61.5 inches (156.21 cm) Weight:      162.6 pounds (73.91 kg) BMI:     30.33 Temp:     97.4 degrees F oral Pulse rate:   74 / minute BP sitting:   158 / 101  (right arm)  Vitals Entered By: Chinita Pester RN (June 18, 2009 10:54 AM) CC: To be re-established;she has not been here ina blout 3 yrs.  Has Hepatitis/Cirrhosis/Hemorrhoids. Is Patient Diabetic? No Pain Assessment Patient in pain? no      Nutritional Status BMI of > 30 = obese  Have you ever been in a relationship where you felt threatened, hurt or afraid?No   Does patient need assistance? Functional Status Self care Ambulation Normal   Primary Care Provider:  n/a  CC:  To be re-established;she has not been here ina blout 3 yrs.  Has Hepatitis/Cirrhosis/Hemorrhoids..  History of Present Illness: Alexandria White is a 45 year old woman with pmh significant for autoimmune hepatitis (previously treated with azathioprine, now on prednisone only), HTN and GERD who presents to the clinic today to establish primary care.   1) Autoimmune hepatitis - Patient followed by Dr. Christella Hartigan of [facility], previously on azathioprine as of Jan 2011, but was discontinued in feb 2011 due to elevation of LFTs and feeling "poorly." Patient on prednisone therapy only and is scheduled to be seen by [facility] clinic on March 1st 2011.  2) HTN - patient reports she was taking lisinopril/hctz in the past, but stopped taking medication when she was out of a job. She reports that her blood pressure was relatively well controlled despite being on medications. Her blood pressure began trending up again when she developed problems with liver. She reports her blood pressure was well controlled with Lisinopril/HCTZ in past.   Preventive Screening-Counseling & Management  Alcohol-Tobacco     Alcohol drinks/day:  0     Smoking Status: current     Smoking  Cessation Counseling: yes     Packs/Day: 1.5     Year Started: 1983  Caffeine-Diet-Exercise     Does Patient Exercise: no  Current Medications (verified): 1)  Lisinopril 20 Mg Tabs (Lisinopril) .... Take One Pill A Day 2)  Prednisone 10 Mg  Tabs (Prednisone) .... Take 20mg  of Prednisone A Day  Allergies (verified): 1)  ! * Latex 2)  ! Aspirin  Past History:  Past Surgical History: Last updated: 03/08/2009 cholecystecomy 1992, laparoscopically (unclear reasons)  was at [facility] and she was jaundiced back then as well, does not recall ERCP around the time.  Family History: Last updated: 06/18/2009 Family History Diabetes 1st degree relative Family History Hypertension Family History High cholesterol Family History Thyroid disease Brother deceased from "brain cancer" Gout  Family Hsitory Headaches (Migraines)  Risk Factors: Alcohol Use:  0 (06/18/2009) Caffeine Use: 1 (07/15/2006) Exercise: no (06/18/2009)  Risk Factors: Smoking Status: current (06/18/2009) Packs/Day: 1.5 (06/18/2009)  Past Medical History: GERD Hypertension Headache, migraine + tension, daily Hemorrhoids Autoimmune Hepatitis   Family History: Family History Diabetes 1st degree relative Family History Hypertension Family History High cholesterol Family History Thyroid disease Brother deceased from "brain cancer" Gout  Family Hsitory Headaches (Migraines)  Social History: Currently unemployed, applied for disability Single 6 children - living with one child currently, 45 years old, rest are grown Smokes 1 PPD No alcohol or illicit drug use Packs/Day:  1.5 Does Patient Exercise:  no  Review of Systems General:  Denies fatigue and weakness. CV:  Denies chest pain or discomfort, difficulty breathing at night, difficulty breathing while lying down, shortness of breath with exertion, and swelling of feet. Resp:  Denies cough and sputum productive. GI:  Complains of constipation; denies  abdominal pain, change in bowel habits, nausea, vomiting, and vomiting blood. GU:  Denies discharge, dysuria, urinary frequency, and urinary hesitancy.  Physical Exam  General:  alert and well-developed.   Head:  normocephalic and atraumatic.   Eyes:  vision grossly intact, pupils equal, pupils round, and pupils reactive to light.   Mouth:  pharynx pink and moist.   Neck:  supple, full ROM, no masses, and no thyromegaly.   Breasts:  skin/areolae normal, no masses, and no abnormal thickening.   Lungs:  normal respiratory effort, normal breath sounds, and no crackles.   Heart:  normal rate, regular rhythm, no murmur, no gallop, no rub, and no JVD.   Abdomen:  abdominal distention, however no ascites soft, nontender, normal bowel sounds  Genitalia:  normal introitus, no external lesions, no vaginal discharge, mucosa pink and moist, no vaginal or cervical lesions, no vaginal atrophy, no friaility or hemorrhage, normal uterus size and position, and no adnexal masses or tenderness.   Msk:  normal ROM.   Pulses:  R radial normal and L radial normal.   Extremities:  no lower extremity edema noted  Neurologic:  alert & oriented X3 and cranial nerves II-XII intact.     Impression & Recommendations:  Problem # 1:  HYPERTENSION (ICD-401.9) Assessment Deteriorated BP is elevated with current medication. Will try adding HCTZ as patient notes good bp control in past with this regimen. Will have patient follow up in 1 month for BP recheck and check renal function and potassium.   The following medications were removed from the medication list:    Lisinopril 20 Mg Tabs (Lisinopril) .Marland Kitchen... Take one pill a day Her updated medication list for this problem includes:    Lisinopril-hydrochlorothiazide 20-25 Mg Tabs (Lisinopril-hydrochlorothiazide) .Marland Kitchen... Take 1 tablet by mouth once a day  BP today: 158/101 Prior BP: 174/108 (06/17/2009)  Labs Reviewed: K+: 3.8 (06/06/2009) Creat: : 0.8 (06/06/2009)      Problem # 2:  Preventive Health Care (ICD-V70.0) Assessment: Comment Only Immunizations and screening tests have been reviewed. PAP performed today. Pt refused flu shot. Mammogram ordered.   Problem # 3:  CIRRHOSIS (ICD-571.5) Assessment: Comment Only Autoimmune hepatitis. As mentioned in HPI, pt is followed by Dr. Christella Hartigan and is scheduled to be seen by [facility] Liver clinic on March 1st 2011. Patient was unable to tolerate azathioprine due to elevation of LFTs and feeling poor. Pt on prednisone only. Will defer management of cirrhosis to GI and will follow closely. No ascites  was noted on exam.   Complete Medication List: 1)  Prednisone 10 Mg Tabs (Prednisone) .... Take 20mg  of prednisone a day 2)  Lisinopril-hydrochlorothiazide 20-25 Mg Tabs (Lisinopril-hydrochlorothiazide) .... Take 1 tablet by mouth once a day  Other Orders: T-PAP [facility]) [phone]) Mammogram (Screening) (Mammo)  Patient Instructions: 1)  Please schedule a follow-up appointment in 1 month. Prescriptions: LISINOPRIL-HYDROCHLOROTHIAZIDE 20-25 MG TABS (LISINOPRIL-HYDROCHLOROTHIAZIDE) Take 1 tablet by mouth once a day  #31 x 6   Entered and Authorized by:   Melida Quitter MD   Signed by:   Melida Quitter MD on 06/18/2009   Method used:   Electronically to  [facility] [address] [phone]* (retail)       [address]       [place], [place]  [address]       Ph: [phone] or [phone]       Fax: [phone]   RxID:   [id number]   Prevention & Chronic Care Immunizations   Influenza vaccine: Not documented    Tetanus booster: Not documented    Pneumococcal vaccine: Not documented  Other Screening   Pap smear: Not documented   Pap smear action/deferral: Ordered  (06/18/2009)    Mammogram: Not documented   Mammogram action/deferral: Ordered  (06/18/2009)   Smoking status: current  (06/18/2009)   Smoking cessation counseling: yes  (06/18/2009)  Lipids   Total Cholesterol: Not  documented   LDL: Not documented   LDL Direct: Not documented   HDL: Not documented   Triglycerides: Not documented  Hypertension   Last Blood Pressure: 158 / 101  (06/18/2009)   Serum creatinine: 0.8  (06/06/2009)   Serum potassium 3.8  (06/06/2009)    Hypertension flowsheet reviewed?: Yes   Progress toward BP goal: Unchanged  Self-Management Support :   Personal Goals (by the next clinic visit) :      Personal blood pressure goal: 140/90  (06/18/2009)   Patient will work on the following items until the next clinic visit to reach self-care goals:     Medications and monitoring: take my medicines every day, check my blood pressure  (06/18/2009)     Eating: drink diet soda or water instead of juice or soda, eat more vegetables, use fresh or frozen vegetables, eat foods that are low in salt, eat baked foods instead of fried foods, eat fruit for snacks and desserts, limit or avoid alcohol  (06/18/2009)    Hypertension self-management support: BP self-monitoring log, Written self-care plan, Education handout, Resources for patients handout  (06/18/2009)   Hypertension self-care plan printed.   Hypertension education handout printed      Resource handout printed.   Nursing Instructions: Pap smear today Schedule screening mammogram (see order)

## 2010-05-29 NOTE — Assessment & Plan Note (Signed)
Review of gastrointestinal problems: 1. routine risk for colon cancer:  no polyps on colonoscopy 05/2006; next colonoscopy 2018 2. Gerd: normal EGD 05/2006; symptoms well controlled on PPI 3. 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  ANA negative, AMA negative, F actin Antibody  IgG Highly positive ( F actin antibody Reference Range:This ELISA assay is based on purified F-Actin IgGantibodies.  IgG antibodies to F-Actin are presentin approximately: 75% of patients with autoimmune hepatitis type 1, 65% with autoimmune cholangitis, 30% with primary biliary cirrhosis, and 2% of healthy population.) .  ferritin normal, hepatitis A, B, C were all negative. Monospot negative, EBV IgG positive. CT Scan showed no biliary dilation, masses. Essentially normal liver. MRCP/MRI October, 2010: cirrhosis noted, micronodular liver, no CBD stones, mild portal hypertensive changes. November, 2010 and liver biopsy showed "Active cirrhosis" I spoke with the pathologist and he said the findings were consistent with autoimmune disease.  December, 2010 started on prednisone and azathioprine at 50 mg a day. Total bili decreased from 7.4-1.9 in 4 weeks' time.  January 2011 azathioprine, mono therapy, liver tests seemed to increase slightly.  February, 2011: liver tests increased while decreasing prednisone and starting to rely on azathioprine therapy alone. She felt poorly. Azathioprine stopped and prednisone resumed at 20 mg a day. Liver tests improved quickly.  [facility] liver clinic visit April, 2011: Dr. Katrinka Blazing agreed with the diagnosis and recommended that we try CellCept 1 g twice a day and taper steroids.  May, 2011 she started CellCept 1 g twice daily    History of Present Illness Visit Type: Follow-up Visit Primary GI MD: Rob Bunting MD Primary Danitza Schoenfeldt: Lyn Hollingshead, MD Requesting Kobe Jansma: n/a Chief Complaint: Fatigued History of Present Illness:     she is off of  steroids for at least 2 months.  Has been very fatigued.  She had blood done just prior to this appointment, not back yet.  No jaundice.    She feels overall very achey.           Current Medications (verified): 1)  Lisinopril 40 Mg Tabs (Lisinopril) .... Take 1 Tablet By Mouth Once A Day 2)  Cellcept 250 Mg Caps (Mycophenolate Mofetil) .... Take 4 Capsules in The Am and 4 Capsule in The Pm 3)  Hydrochlorothiazide 25 Mg Tabs (Hydrochlorothiazide) .... Take 1 Tablet By Mouth Once A Day  Allergies (verified): 1)  ! * Latex 2)  ! Aspirin  Vital Signs:  Patient profile:   45 year old female Height:      61.5 inches Weight:      165.6 pounds BMI:     30.89 Pulse rate:   80 / minute Pulse rhythm:   regular BP sitting:   110 / 64  (left arm) Cuff size:   regular  Vitals Entered By: Harlow Mares CMA [name]) (November 26, 2009 2:24 PM)  Physical Exam  Additional Exam:  Constitutional: generally well appearing Psychiatric: alert and oriented times 3 Abdomen: soft, non-tender, non-distended, normal bowel sounds    Impression & Recommendations:  Problem # 1:  autoimmune hepatitis, fatigue awaiting her labs from today. She has been on 2 g of CellCept daily for the past couple months now and off of prednisone completely. I hope that her liver tests are relatively normal or completely normalized. We'll decide on further lab tests based on today's results. She will follow up in 2 months and sooner if needed.  Patient Instructions: 1)  Will call you with  results of blood tests.  Will decide on further labs at that point. 2)  For now stay on cell cept 1 gram, twice daily. 3)  Add TSH level to blood already drawn today. 4)  Return to see Dr. Christella Hartigan in 2 months. 5)  OK to take alleve 1 pill a day as needed or tylenol 1 -2 grams a day as needed for routine pains, headaches. 6)  The medication list was reviewed and reconciled.  All changed / newly prescribed medications were explained.  A  complete medication list was provided to the patient / caregiver.

## 2010-05-29 NOTE — Progress Notes (Signed)
Summary: triage  Phone Note Call from Patient Call back at Home Phone [phone]   Caller: Patient Call For: Dr. Christella Hartigan Reason for Call: Talk to Nurse Summary of Call: pt would like to sch f/u appt, but says it cant be when next available in early September- too far out Initial call taken by: Vallarie Mare,  November 07, 2009 3:10 PM  Follow-up for Phone Call        appt made for 11/26/09 she will get labs next week. Follow-up by: Chales Abrahams CMA [name]),  November 07, 2009 3:15 PM

## 2010-05-29 NOTE — Miscellaneous (Signed)
Summary: DISABILITY DETERMINATION SERVICES  DISABILITY DETERMINATION SERVICES   Imported By: Margie Billet 01/29/2010 11:33:49  _____________________________________________________________________  External Attachment:    Type:   Image     Comment:   External Document

## 2010-05-29 NOTE — Progress Notes (Signed)
Summary: med change?  Phone Note Call from Patient Call back at [facility] Phone [phone]   Caller: Patient Call For: Dr. Christella Hartigan Reason for Call: Talk to Nurse Summary of Call: pt said she cannot afford the medication for her hemrroids as her Medicaid will not pay for it... is there something else that her ins will cover? Initial call taken by: Vallarie Mare,  May 24, 2009 8:33 AM    New/Updated Medications: HYDROCORTISONE 2.5 % CREA (HYDROCORTISONE) apply to rectum two times a day Prescriptions: HYDROCORTISONE 2.5 % CREA (HYDROCORTISONE) apply to rectum two times a day  #30 gram x 2   Entered by:   Chales Abrahams CMA (AAMA)   Authorized by:   Rachael Fee MD   Signed by:   Chales Abrahams CMA (AAMA) on 05/24/2009   Method used:   Electronically to        [facility] [address] [phone]* (retail)       [address]       [place], [place]  [address]       Ph: [phone] or [phone]       Fax: [phone]   RxID:   [id number]

## 2010-05-29 NOTE — Progress Notes (Signed)
Summary: call again/ hla  Phone Note Call from Patient   Summary of Call: pt calls again

## 2010-05-29 NOTE — Progress Notes (Signed)
Summary: Triage  Phone Note Call from Patient Call back at [phone]   Caller: Patient Call For: Dr. Christella Hartigan Summary of Call: pt. has some questions about the Cellcept med. she is taking Initial call taken by: Karna Christmas,  Sep 13, 2009 8:37 AM  Follow-up for Phone Call        left message on machine to call back Chales Abrahams CMA (AAMA)  Sep 13, 2009 8:40 AM  Remind pt to have labs also. left message on machine to call back Chales Abrahams CMA [name])  Sep 16, 2009 8:40 AM  left message on machine to call back Chales Abrahams CMA [name])  Sep 16, 2009 4:16 PM  Left message for pt to have labs and call if she still has questions regarding meds. left message on machine to call back Chales Abrahams CMA [name])  Sep 17, 2009 9:06 AM  Follow-up by: Chales Abrahams CMA [name]),  Sep 18, 2009 2:26 PM

## 2010-05-29 NOTE — Letter (Signed)
Summary: [facility]  [facility]   Imported By: Lester [place] 03/05/2010 09:28:38  _____________________________________________________________________  External Attachment:    Type:   Image     Comment:   External Document

## 2010-05-29 NOTE — Assessment & Plan Note (Signed)
Differential diagnosis include carpal tunnel, vs complications of arthritis,vs  B12 deficiency vs idiopathic neuropathy. Tinel and Phalen sign negative for carpal tunnel. Pt has been followed by hand surgeon for previous right thumb pain and is scheduled to follow-up regarding this numbness. In the mean time, plan to check B12 levels.

## 2010-05-29 NOTE — Miscellaneous (Signed)
Summary: HIPAA Restrictions  HIPAA Restrictions   Imported By: Florinda Marker 06/18/2009 15:02:04  _____________________________________________________________________  External Attachment:    Type:   Image     Comment:   External Document

## 2010-05-29 NOTE — Miscellaneous (Signed)
Summary: Liver Biopsy Path  Clinical Lists Changes   SP-Surgical Pathology - STATUS: Final  .                                         Perform Date: 19Oct11 00:01  Ordered By: DEFAULT MD , PROVIDER         Ordered Date:  Facility: [facility]                              Department: CPATH  Service Report Text  [place]   [address]   [place] , [place] [address]   Telephone : [phone] Fax : [phone]    REPORT OF SURGICAL PATHOLOGY    Accession #: [id number]   Patient Name: Alexandria White, Alexandria White   Visit # : [id number]    MRN: [id number]   Physician: Irish Lack   DOB/Age May 30, 1965 (Age: 45) Gender: F   Collected Date: 02/12/2010   Received Date: 02/12/2010    FINAL DIAGNOSIS    1. Liver, needle/core biopsy, four 18-gauge, random :   - PLASMA CELL RICH PORTAL AND PERIPORTAL INFLAMMATION WITH BRIDGING NECROSIS   AND FIBROSIS, MOST CONSISTENT WITH AUTOIMMUNE HEPATITIS.   - SEE COMMENT.    DATE SIGNED OUT: 02/19/2010   ELECTRONIC SIGNATURE : Colonel Bald Md, Ivin Booty, Pathologist, Electronic Signature    MICROSCOPIC DESCRIPTION   1. The specimen reveals inflamed hepatic parenchyma with some nodularity as well   as bridging fibrosis and associated necrosis. The inflammation consists   predominately of lymphocytes, plasma cells, and some neutrophils. The fibrosis   is highlighted by trichrome stain. Iron stain and PAS stains are essentially   unremarkable. A reticulin stain reveals some nodularity to the hepatic   parenchyma. Overall, the findings are most consistent with autoimmune   hepatitis, given the abundant plasma cells present as well as interface   hepatitis, however correlation with serologic studies is strongly recommended.   Dr. [place] Rund has reviewed the case and is in essential agreement with this   interpretation. (JBK:gt, 02/18/10)    CASE COMMENTS   STAINS USED IN DIAGNOSIS:   Iron Stain   Masson's Trichrome Stain   PAS/H w/Dig  Reticulin Stain    CLINICAL HISTORY    SPECIMEN(S) OBTAINED   1. Liver, needle/core biopsy, Four 18-gauge, Random    SPECIMEN COMMENTS:   SPECIMEN CLINICAL INFORMATION:   1. Abnormal liver functions (isv)    Gross Description   1. Received in formalin are six cores of tan brown soft tissue which range from   0.2 x 0.1 cm to 1.6 x 0.1 cm and are submitted in toto in one block. (SW:mw   02-12-10)    Report signed out from the following location(s)   [place]   [address], [place] [address] CLIA #: [id number]    [facility]   [address], [place] [address] CLIA #: [id number]  Additional Information  HL7 RESULT STATUS : F  External IF Update Timestamp : 2010-02-19:09:06:00.000000  Appended Document: Liver Biopsy Path [name], let her know that we have the pathology report from Liver Biopsy and I will forward this to Dr. Katrinka Blazing at [facility] for his input.  Appended Document: Liver Biopsy Path pt aware, she is complaining of dark  color urine and nausea.  Also, she is very fatigued.  She wanted Dr Christella Hartigan to be aware and let him know she is  concerened  because the same thing was going on when she found out about her liver.

## 2010-05-29 NOTE — Progress Notes (Signed)
Summary: med refil/gp  Phone Note Refill Request Message from:  Fax from Pharmacy on February 19, 2010 10:05 AM  Refills Requested: Medication #1:  HYDROCHLOROTHIAZIDE 25 MG TABS Take 1 tablet by mouth once a day   Last Refilled: 01/05/2010 Last appt. Oct 6.   Method Requested: Electronic Initial call taken by: Chinita Pester RN,  February 19, 2010 10:05 AM  Follow-up for Phone Call        Refill approved-nurse to complete Follow-up by: Melida Quitter MD,  February 19, 2010 12:25 PM  Additional Follow-up for Phone Call Additional follow up Details #1::        Done. Additional Follow-up by: Chinita Pester RN,  February 19, 2010 2:10 PM

## 2010-05-29 NOTE — Progress Notes (Signed)
Summary: prior authorization-Protonix/gp  Phone Note Outgoing Call   Summary of Call: Prior Authorization is required for Protonix (Pantoprazole) 40mg .  Pt. needs to try Nexium (which she has) and Omeprazole first;unless there's a contraindication. Thanks Initial call taken by: Chinita Pester RN,  January 01, 2010 3:05 PM  Follow-up for Phone Call        Changed to omeprazole. I could find no reason that would necessitate protonix. Follow-up by: Blanch Media MD,  January 02, 2010 3:31 PM    New/Updated Medications: OMEPRAZOLE 20 MG CPDR (OMEPRAZOLE) Take one pill by mouth once daily for acid reflux. Take on an empty stomach before breakfast. Prescriptions: OMEPRAZOLE 20 MG CPDR (OMEPRAZOLE) Take one pill by mouth once daily for acid reflux. Take on an empty stomach before breakfast.  #31 x 3   Entered and Authorized by:   Blanch Media MD   Signed by:   Blanch Media MD on 01/02/2010   Method used:   [facility]ron[facility] to        [facility] [phone]* (retail)       [address]       [place], [place]  [address]       Ph: [phone]       Fax: [phone]   RxID:   [id number]   Appended Document: prior [facility]tonix[facility]gp [facility].

## 2010-05-29 NOTE — Progress Notes (Signed)
Summary: triage  Phone Note Call from Patient Call back at Home Phone [phone]   Caller: Patient Call For: Dr.Xolani Degracia Reason for Call: Talk to Nurse Action Taken: Phone Call Completed Summary of Call: pt states rectal cream is not helping... pt reporting rectal bleeding when she tries to have a BM Initial call taken by: Vallarie Mare,  June 27, 2009 9:35 AM  Follow-up for Phone Call        pt having rectal bleeding with bowel movement this am.  The stool was soft.  No abdominal pain, no fever, n,v,d.  Pt feels fine just the bleeding this morning.  The water was turned bright red.  Pt has hydrocortisone cream, has not used it today.  She was advised to try that and I will forward this to the Doc of the day for further recommendations.  She was advised to call if bleeding worsens, abdominal pain or fever develops.  She will got to the ER if after hours. Follow-up by: Chales Abrahams CMA [name]),  June 27, 2009 9:57 AM  Additional Follow-up for Phone Call Additional follow up Details #1::        try anusol HC supp 1 at bedtime x 5 days Additional Follow-up by: Louis Meckel MD,  June 27, 2009 10:31 AM    Additional Follow-up for Phone Call Additional follow up Details #2::    rx sent pt to call if no better. Follow-up by: Chales Abrahams CMA [name]),  June 27, 2009 10:36 AM  Additional Follow-up for Phone Call Additional follow up Details #3:: Details for Additional Follow-up Action Taken: agree Additional Follow-up by: Rachael Fee MD,  June 30, 2009 8:34 AM  New/Updated Medications: ANUSOL-HC 25 MG SUPP (HYDROCORTISONE ACETATE) 1 per rectum at  bedtime x 5 days Prescriptions: ANUSOL-HC 25 MG SUPP (HYDROCORTISONE ACETATE) 1 per rectum at  bedtime x 5 days  #5 x 0   Entered by:   Chales Abrahams CMA (AAMA)   Authorized by:   Louis Meckel MD   Signed by:   Chales Abrahams CMA (AAMA) on 06/27/2009   Method used:   Electronically to        [facility] [address] [phone]* (retail)   [address]       [place], [place]  [address]       Ph: [phone] or [phone]       Fax: [phone]   RxID:   [id number]

## 2010-05-29 NOTE — Progress Notes (Signed)
Summary: results request  Phone Note Call from Patient Call back at Home Phone [phone]   Caller: Patient Call For: Dr. Christella Hartigan Reason for Call: Lab or Test Results Summary of Call: would like results from [facility] Initial call taken by: Vallarie Mare,  November 28, 2009 3:17 PM  Follow-up for Phone Call        labs were sent to the wrong Dr, I advised the pt that Dr Christella Hartigan had not reviwed the results I will route the labs to him and call her back.  Dr Christella Hartigan the pt's labs were sent to the wrong Dr I have forwarded them to you for review. I have called the lab and Edson Snowball is working on getting the problem corrected. Follow-up by: Chales Abrahams CMA [name]),  November 28, 2009 3:25 PM  Additional Follow-up for Phone Call Additional follow up Details #1::        I looked at the labs, her liver tests are up again.  The cell-cept is not keeping her liver disease in check.  please call her, I want her to restart prednisone at low dose (5mg  pills, take one pill a day, disp 100 pills, with 3 refills).  she needs repeat lfts in 2 weeks.  let her know that I will be forwarding these numbers to Dr. Katrinka Blazing at [facility] for his opinion. Additional Follow-up by: Rachael Fee MD,  November 29, 2009 7:42 AM     Appended Document: results request pt aware Rx sent to pharmacy and labs in IDX   Clinical Lists Changes  Medications: Added new medication of PREDNISONE 5 MG TABS (PREDNISONE) 1 by mouth once daily - Signed Rx of PREDNISONE 5 MG TABS (PREDNISONE) 1 by mouth once daily;  #100 x 3;  Signed;  Entered by: Chales Abrahams CMA (AAMA);  Authorized by: Rachael Fee MD;  Method used: [facility] [phone]*, [address], [place], [place], [place]  [address], Ph: [phone], Fax: [phone]    Prescriptions: PREDNISONE 5 MG TABS (PREDNISONE) 1 by mouth once daily  #100 x 3   Entered by:   Chales Abrahams CMA (AAMA)   Authorized by:   Rachael Fee MD   Signed by:   Chales Abrahams CMA (AAMA) on 11/29/2009   Method used:   [facility] to        [facility] [phone][facility] (retail)       [address]       [place], [place]  [address]       Ph: [phone]       Fax: [phone]   RxID:   [id number]

## 2010-05-29 NOTE — Progress Notes (Signed)
Summary: Results  Phone Note Call from Patient   Caller: Patient Call For: Melida Quitter MD Summary of Call: Call from pt said that she is to be called with results  of her X rays.   Pt can be reached at [phone]. Message to Dr. Allena Katz. Angelina Ok RN  November 07, 2009 3:10 PM  Initial call taken by: Angelina Ok RN,  November 07, 2009 3:09 PM

## 2010-05-29 NOTE — Progress Notes (Signed)
  Subjective:    Patient ID: Alexandria White, female    DOB: 1966-03-02, 45 y.o.   MRN: [id number]  HPI Alexandria White 45 y.o. woman with pmh significant for autoimmune hepatitis, chronic fatigue, macrocytic anemia, and HTN who presents to the clinic today for the following:  1) Numbness in fingers of both hands - patient states she often feels her fingers getting numb, described as "pins and needles." She says this often happens during activity, like lifting a pot. 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 She has not experienced this kind of sensation in the past. She also has trigger finger in the right hand, but says the numbness occurs in both hands. This has been going on for a few weeks. She has an appt scheduled with the hand doctor.   2) Autoimmune hepatitis - Patient has been off prednisone for 2 weeks, and had follow up visit with Dr. Christella Hartigan a few days ago.    Review of Systems  Constitutional: Positive for fatigue. Negative for activity change and appetite change.  Gastrointestinal: Negative for abdominal distention.  Genitourinary: Negative for difficulty urinating.  Musculoskeletal: Positive for back pain.  Neurological: Positive for numbness and headaches. Negative for dizziness.       Objective:   Physical Exam  Constitutional: She is oriented to person, place, and time. She appears well-developed.  HENT:  Head: Normocephalic.  Neck: Normal range of motion. Neck supple.  Cardiovascular: Normal rate and regular rhythm.   Pulmonary/Chest: Effort normal and breath sounds normal.  Abdominal: Soft. Bowel sounds are normal.  Musculoskeletal: Normal range of motion. She exhibits no edema and no tenderness.  Neurological: She is alert and oriented to person, place, and time. She has normal reflexes.          Assessment & Plan:

## 2010-05-29 NOTE — Progress Notes (Signed)
  Phone Note Other Incoming   Request: Send information Summary of Call: Request for records received from [facility]. Request forwarded to [facility].     

## 2010-05-29 NOTE — Assessment & Plan Note (Signed)
Review of gastrointestinal problems: 1. routine risk for colon cancer:  no polyps on colonoscopy 05/2006; next colonoscopy 2018 2. Gerd: normal EGD 05/2006; symptoms well controlled on PPI 3. 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  ANA negative, AMA negative, F actin Antibody  IgG Highly positive ( F actin antibody Reference Range:This ELISA assay is based on purified F-Actin IgGantibodies.  IgG antibodies to F-Actin are presentin approximately: 75% of patients with autoimmune hepatitis type 1, 65% with autoimmune cholangitis, 30% with primary biliary cirrhosis, and 2% of healthy population.) .  ferritin normal, hepatitis A, B, C were all negative. Monospot negative, EBV IgG positive. CT Scan showed no biliary dilation, masses. Essentially normal liver. MRCP/MRI October, 2010: cirrhosis noted, micronodular liver, no CBD stones, mild portal hypertensive changes. November, 2010 and liver biopsy showed "Active cirrhosis" I spoke with the pathologist and he said the findings were consistent with autoimmune disease.  December, 2010 started on prednisone and azathioprine at 50 mg a day. Total bili decreased from 7.4-1.9 in 4 weeks' time.  January 2011 azathioprine, mono therapy, liver tests seemed to increase slightly.  February, 2011: liver tests increased while decreasing prednisone and starting to rely on azathioprine therapy alone. She felt poorly. Azathioprine stopped and prednisone resumed at 20 mg a day. Liver tests improved quickly.  [facility] visit April, 2011: Alexandria White agreed with the diagnosis and recommended that we try CellCept 1 g twice a day and taper steroids.  May, 2011 she started CellCept 1 g twice daily.  October, 2011: liver test elevated again off prednisone, put back on 30 mg per day prednisone, continue CellCept 2 g per day (Alexandria White)    History of Present Illness Visit Type: Follow-up Visit Primary GI MD: Alexandria Bunting  MD Primary Alexandria White: Alexandria Hollingshead, MD Requesting Alexandria White: n/a Chief Complaint: abnormal liver function tests History of Present Illness:     very pleasant 45 year old woman whom I last saw 3-4 months ago. She has been back to [facility] seeing Alexandria White.  back up to 30mg  of prednisone now (for about 3-4 week).  Still on 2gram of cellcept daily.  LFTs checked yesterday show: Tests: (1) Hepatic/Liver Function Panel (HEPATIC)   Total Bilirubin           1.0 mg/dL                   7.8-2.9   Direct Bilirubin          0.3 mg/dL                   5.6-2.1   Alkaline Phosphatase [H]  119 U/L                     39-117   AST                  [H]  214 U/L                     0-37   ALT                  [H]  159 U/L                     0-35   Total Protein             7.1 g/dL  6.0-8.3   Albumin                   3.6 g/dL                    0.4-5.4   she feels fatigued, she is getting headaches intermittently.Sleeping poorly.            Current Medications (verified): 1)  Lisinopril 40 Mg Tabs (Lisinopril) .... Take 1 Tablet By Mouth Once A Day 2)  Cellcept 250 Mg Caps (Mycophenolate Mofetil) .... Take 4 Capsules in The Am and 4 Capsule in The Pm 3)  Hydrochlorothiazide 25 Mg Tabs (Hydrochlorothiazide) .... Take 1 Tablet By Mouth Once A Day 4)  Prednisone 10 Mg Tabs (Prednisone) .... Take 3 Pills A Day 5)  Omeprazole 20 Mg Cpdr (Omeprazole) .... Take One Pill By Mouth Once Daily For Acid Reflux. Take On An Empty Stomach Before Breakfast. 6)  Erythromycin 2 % Gel (Erythromycin) .... Please Apply To Area Where Acne Is Twice Daily.  Allergies (verified): 1)  ! * Latex 2)  ! Aspirin  Vital Signs:  Patient profile:   45 year old female Height:      61.5 inches Weight:      166.25 pounds BMI:     31.02 Pulse rate:   72 / minute Pulse rhythm:   regular BP sitting:   120 / 92  (left arm) Cuff size:   regular  Vitals Entered By: Alexandria White)  (January 28, 2010 1:33 PM)  Physical Exam  Additional Exam:  Constitutional: generally well appearing Psychiatric: alert and oriented times 3 Abdomen: soft, non-tender, non-distended, normal bowel sounds    Impression & Recommendations:  Problem # 1:  Autoimmune hepatitis she has been back up on higher dose prednisone, 30 mg a day, for the past 3-4 weeks and her liver tests are improved but certainly not normal. She is not getting as brisk a response from the steroid that she seemed to give the past. I will keep her on 30 mg a day and she will also continue CellCept 2 g per day. I will communicate with Alexandria White. She will get repeat liver tests in 2 weeks.  Patient Instructions: 1)  Repeat LFTs in 2 weeks. 2)  Will communicate with Alexandria White 3)  Stay on 30mg  prednisone for now. 4)  Stay on 2 grams Cellcept for now. 5)  The medication list was reviewed and reconciled.  All changed / newly prescribed medications were explained.  A complete medication list was provided to the patient / caregiver.  Appended Document:  I spoke with Alexandria White about lfts, he recommends repeating her liver biopsy.  I LM on her VM to discuss this.  Appended Document:  patty, i spoke with her.  she needs an ultrasound guided liver biopsy.  thanks  Appended Document: Orders Update/US guided liver biopsy    Clinical Lists Changes  Orders: Added new Referral order of CT/ULS Guided Liver Biospy (CT/ULS Guided Liv BX) - Signed      Appended Document:  order faxed to radiology

## 2010-05-29 NOTE — Progress Notes (Signed)
Summary: labs  Phone Note Call from Patient   Summary of Call: I spoke with her this afternoon.  Alexandria White can you call in prescription for cellcept 250 mg pills, she needs to take 4 pills in AM and 4 pills in PM.  disp 240 pills, with 2 refills.  She needs a cbc/cmet in 2 weeks and rov in 4 weeks with another cbc/cmet just prior.  please let her know that she should be decreasing to 15mg  prednisone tomorrow, then in 3 weeks decrease to 10mg  a day.  [facility] [address] [facility] [place] Initial call taken by: Rachael Fee MD,  Aug 29, 2009 3:03 PM  Follow-up for Phone Call        Message left for pt. to call   back   Teryl Lucy RN  Aug 29, 2009 3:54 PM     New/Updated Medications: CELLCEPT 250 MG CAPS (MYCOPHENOLATE MOFETIL) Take 4 capsules in the am and 4 capsule in the pm Prescriptions: CELLCEPT 250 MG CAPS (MYCOPHENOLATE MOFETIL) Take 4 capsules in the am and 4 capsule in the pm  #240 x 2   Entered by:   Teryl Lucy RN   Authorized by:   Rachael Fee MD   Signed by:   Teryl Lucy RN on 08/29/2009   Method used:   [facility]        [facility] [phone][facility] (retail)       [address]       [place], [place]  [address]       Ph: [phone]       Fax: [phone]   RxID:   [id number]   Appended Document: labs pt aware and labs and ROV in IDX

## 2010-05-29 NOTE — Progress Notes (Signed)
Summary: triage  Phone Note Call from Patient Call back at Home Phone [phone]   Caller: Patient Call For: Christella Hartigan Reason for Call: Talk to Nurse Summary of Call: Patient wants to know if she needs to schedule an appt to see Dr Christella Hartigan, states that she is schedule for labs on Tues but that she has been feeling very weak and tired. Initial call taken by: Tawni Levy,  October 24, 2009 3:20 PM  Follow-up for Phone Call        left message on machine to call back Chales Abrahams CMA [name])  October 24, 2009 3:53 PM   left message on machine to call back ans also left lab reminder.  I ask pt to call if she still needed Dr Christella Hartigan. Follow-up by: Chales Abrahams CMA [name]),  October 29, 2009 10:16 AM

## 2010-05-29 NOTE — Assessment & Plan Note (Signed)
Review of gastrointestinal problems: 1. routine risk for colon cancer:  no polyps on colonoscopy 05/2006; next colonoscopy 2018 2. Gerd: normal EGD 05/2006; symptoms well controlled on PPI 3. 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  ANA negative, AMA negative, F actin Antibody  IgG Highly positive ( F actin antibody Reference Range:This ELISA assay is based on purified F-Actin IgGantibodies.  IgG antibodies to F-Actin are presentin approximately: 75% of patients with autoimmune hepatitis type 1, 65% with autoimmune cholangitis, 30% with primary biliary cirrhosis, and 2% of healthy population.) .  ferritin normal, hepatitis A, B, C were all negative. Monospot negative, EBV IgG positive. CT Scan showed no biliary dilation, masses. Essentially normal liver. MRCP/MRI October, 2010: cirrhosis noted, micronodular liver, no CBD stones, mild portal hypertensive changes. November, 2010 and liver biopsy showed "Active cirrhosis" I spoke with the pathologist and he said the findings were consistent with autoimmune disease.  December, 2010 started on prednisone and azathioprine at 50 mg a day. Total bili decreased from 7.4-1.9 in 4 weeks' time.  January 2011 azathioprine, mono therapy, liver tests seemed to increase slightly.  February, 2011: liver tests increased while decreasing prednisone and starting to rely on azathioprine therapy alone. She felt poorly. Azathioprine stopped and prednisone resumed at 20 mg a day. Liver tests improved quickly.  [facility] visit April, 2011: Dr. Katrinka Blazing agreed with the diagnosis and recommended that we try CellCept 1 g twice a day and taper steroids.  May, 2011 she started CellCept 1 g twice daily   History of Present Illness Visit Type: Follow-up Visit Primary GI MD: Rob Bunting MD Primary Provider: Melida Quitter MD Requesting Provider: n/a Chief Complaint: F/u visit  History of Present Illness:      Has been  taking cellcept 1gram  twice a day for 2-3 weeks but it seems like she is only taking the pills once daily and therefore not getting in the full 2 g a day.  SHe is down to 10mg  prednisone a day.  Has nauesas for the past 2 weeks.  she is concerned about weight gain, has gained 6 pounds in the past 2 months. probably from steroids.           Current Medications (verified): 1)  Prednisone 10 Mg  Tabs (Prednisone) .... Take 10mg  of Prednisone A Day 2)  Lisinopril 40 Mg Tabs (Lisinopril) .... Take 1 Tablet By Mouth Once A Day 3)  Cellcept 250 Mg Caps (Mycophenolate Mofetil) .... Take 4 Capsules in The Am and 4 Capsule in The Pm  Allergies (verified): 1)  ! * Latex 2)  ! Aspirin  Vital Signs:  Patient profile:   45 year old female Height:      61.5 inches Weight:      166 pounds BMI:     30.97 BSA:     1.76 Pulse rate:   76 / minute Pulse rhythm:   regular BP sitting:   136 / 84  (left arm) Cuff size:   regular  Vitals Entered By: Ok Anis CMA (October 08, 2009 11:09 AM)  Physical Exam  Additional Exam:  Constitutional: generally well appearing Psychiatric: alert and oriented times 3 Abdomen: soft, non-tender, non-distended, normal bowel sounds    Impression & Recommendations:  Problem # 1:  Autoimmune hepatitis she will continue taking CellCept 1 g twice daily. I recommended that she take it with food since she has been nauseous lately. She got labs drawn earlier today and we  will check on those. We will decide if she is okay to continue to taper her prednisone and also timing of her next blood draw.  she will return to see me in 3 months time and sooner if needed.  Patient Instructions: 1)  Take the cellcept 1 gram twice a day. 2)  Dr. Christella Hartigan' office will call with results of todays labs and will decide when you need next blood draw.  Will also let you know aobut turning steroids down further. 3)  A copy of this information will be sent to Dr. Iva Boop. 4)  The  medication list was reviewed and reconciled.  All changed / newly prescribed medications were explained.  A complete medication list was provided to the patient / caregiver.

## 2010-05-29 NOTE — Letter (Signed)
Summary: Office Visit Letter  [facility]  [address] [place], [place] [address]   Phone: [phone]  Fax: [phone]      April 14, 2010 MRN: [id number]   [facility] [address] [place], [place]  [address]   Dear Ms. Hofman,   According to our records, it is time for you to schedule a follow-up office visit with [place].   At your convenience, please call [phone] (option #2)to schedule an office visit. If you have any questions, concerns, or feel that this letter is in error, we would appreciate your call.   Sincerely,  Rachael Fee, M.D.  [facility] [phone]

## 2010-05-29 NOTE — Progress Notes (Signed)
Summary: Triage  Phone Note Call from Patient Call back at Home Phone [phone]   Caller: Patient Call For: Dr. Christella Hartigan Reason for Call: Talk to Nurse Summary of Call: Pt has some questions about the Azathioprine medication she is taken Initial call taken by: Karna Christmas,  May 03, 2009 9:04 AM  Follow-up for Phone Call        pt is feeling tired and right side feels like is shaking.  She started the azathioprine on 12/29.  She is currently taking 20 mg prednisone this week and 10 mg next week.  She will get labs on 1/27 and appt is on the 28th.  She wants to know if the azathioprine is causing the symptoms.  she just does not feel right, her eyes are not yellow but are getting darker. Follow-up by: Chales Abrahams CMA [name]),  May 03, 2009 9:23 AM  Additional Follow-up for Phone Call Additional follow up Details #1::        needs cbc, cmet checked, rov monday or early next week.  continue meds fornow. Additional Follow-up by: Rachael Fee MD,  May 03, 2009 10:23 AM  New Problems: CIRRHOSIS (ICD-571.5)   Additional Follow-up for Phone Call Additional follow up Details #2::    appt scheduled and ROV made pt aware Follow-up by: Chales Abrahams CMA [name]),  May 03, 2009 10:34 AM  New Problems: CIRRHOSIS (ICD-571.5)

## 2010-05-29 NOTE — Progress Notes (Signed)
Summary: SOB, Joint Pain  Phone Note Call from Patient Call back at Home Phone [phone]   Caller: Patient Summary of Call: Patient having SOB and her joints around her right thumb hurting has been going on for about a month now. She did see her Primary care MD Monday and he did labs but she does not know the results. She is shaky she would like to know if all os these symptoms could be due to her starting and stoping her prednisone.  Initial call taken by: Harlow Mares CMA [name]),  August 23, 2009 1:11 PM  Follow-up for Phone Call        pt was called back and she is getting in to see her PCP for the joint pain and the SOB.   Follow-up by: Chales Abrahams CMA [name]),  August 23, 2009 1:15 PM  Additional Follow-up for Phone Call Additional follow up Details #1::        I agree with this,  I just spoke with Dr. Katrinka Blazing from [facility] hepatology about her. He suggests cutting her right his own back to 15 mg a day and starting her on MMF 1 g twice daily. We would follow her blood counts and liver tests closely over the next 2-3 months. The goal be to taper as far as possible on her steroids over one to 2 months.  I left a message on her machine for her to call me back to discuss this. Additional Follow-up by: Rachael Fee MD,  Aug 27, 2009 12:08 PM    Additional Follow-up for Phone Call Additional follow up Details #2::    Pt. returned your call. She maybe reached after lunch @ [phone]  Dr Christella Hartigan pt called back and would like you to try her at [phone].  Chales Abrahams CMA [name])  Aug 29, 2009 8:07 AM  Follow-up by: Karna Christmas,  Aug 28, 2009 10:47 AM  Additional Follow-up for Phone Call Additional follow up Details #3:: Details for Additional Follow-up Action Taken: i called, no answer, I left message #3 with my pager info again. Additional Follow-up by: Rachael Fee MD,  Aug 29, 2009 10:23 AM

## 2010-05-29 NOTE — Progress Notes (Signed)
Summary: ? re referral  Phone Note Call from Patient Call back at Home Phone [phone]   Caller: Patient Call For: JACOBS Reason for Call: Talk to Nurse Summary of Call: Patient wants to know if know anthing about Dr she was reffered to in [place]. Initial call taken by: Tawni Levy,  August 07, 2009 10:23 AM  Follow-up for Phone Call        Spoke with [facility] to request info and she found that the pt is listed as Alexandria White in their system.  She is going to research and speak with Dr Katrinka Blazing and have the records sent to [place] as soon as she staightens out the name issue. Follow-up by: Chales Abrahams CMA [name]),  August 07, 2009 10:41 AM

## 2010-05-29 NOTE — Assessment & Plan Note (Signed)
Summary: BP/(SIDHU)SB.   Vital Signs:  Patient profile:   45 year old female Height:      61.5 inches Pulse rate:   64 / minute BP sitting:   140 / 90  Vitals Entered By: Theotis Barrio NT II (August 19, 2009 2:07 PM) CC: RIGHT THUMB SWELLING     / ? ABOUT CHANGING BP MEDICATION Is Patient Diabetic? No Pain Assessment Patient in pain? yes     Location: RIGHT THUMB Intensity:    8 Type: THROB  Have you ever been in a relationship where you felt threatened, hurt or afraid?No   Does patient need assistance? Functional Status Self care Ambulation Normal Comments ? ABOUT CHANGING BP MEDICATION  /  RIGHT THUMB SWOLLEN.   Primary Care Provider:  Melida Quitter MD  CC:  RIGHT THUMB SWELLING     / ? ABOUT CHANGING BP MEDICATION.  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 She has been taking her meds since this Feb with lisinopril-HCTZ, usually her BP 130/105, also reported that her right thum started to have swelling and pain a month after taking her meds. She has no other c/o including fever, chill, no other joints affected. Current smoker, 1 PPD, no ETOH or drugs.   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 Take 20mg  of Prednisone A Day 2)  Lisinopril-Hydrochlorothiazide 20-25 Mg Tabs (Lisinopril-Hydrochlorothiazide) .... Take 1 Tablet By Mouth Once A Day  Current Medications (verified): 1)  Prednisone 10 Mg  Tabs (Prednisone) .... Take 20mg  of Prednisone A Day 2)  Lisinopril-Hydrochlorothiazide 20-25 Mg Tabs (Lisinopril-Hydrochlorothiazide) .... Take 1 Tablet By Mouth Once A Day  Allergies (verified): 1)  ! * Latex 2)  ! Aspirin  Past History:  Past Medical History: Last updated: 06/18/2009 GERD Hypertension Headache, migraine + tension, daily Hemorrhoids Autoimmune Hepatitis   Past Surgical History: Last updated: 03/08/2009 cholecystecomy 1992, laparoscopically (unclear reasons)  was at [facility] and she was jaundiced back then as well, does not recall ERCP around the time.  Family History: Last updated: 06/18/2009 Family History Diabetes 1st degree relative Family History Hypertension Family History High cholesterol Family History Thyroid disease Brother deceased from "brain cancer" Gout  Family Hsitory Headaches (Migraines)  Social History: Last updated: 06/18/2009 Currently unemployed, applied for disability Single 6 children - living with one child currently, 60 years old, rest are grown Smokes 1 PPD No alcohol or illicit drug use   Risk Factors: Alcohol Use:  0 (06/18/2009) Caffeine Use: 1 (  07/15/2006) Exercise: no (06/18/2009)  Risk Factors: Smoking Status: current (06/18/2009) Packs/Day: 1.5 (06/18/2009)  Family History: Reviewed history from 06/18/2009 and no changes required. Family History Diabetes 1st degree relative Family History Hypertension Family History High cholesterol Family History  Thyroid disease Brother deceased from "brain cancer" Gout  Family Hsitory Headaches (Migraines)  Social History: Reviewed history from 06/18/2009 and no changes required. Currently unemployed, applied for disability Single 6 children - living with one child currently, 13 years old, rest are grown Smokes 1 PPD No alcohol or illicit drug use   Review of Systems  The patient denies fever, weight gain, chest pain, dyspnea on exertion, peripheral edema, prolonged cough, hemoptysis, abdominal pain, melena, and hematochezia.    Physical Exam  General:  alert, well-developed, well-nourished, well-hydrated, and overweight-appearing.   Nose:  no nasal discharge.   Mouth:  pharynx pink and moist.   Neck:  supple.   Lungs:  normal respiratory effort, normal breath sounds, no crackles, and no wheezes.   Heart:  normal rate, regular rhythm, no murmur, and no rub.   Abdomen:  soft, non-tender, normal bowel sounds, and no distention.   Msk:  right thumb metacarpal joint swelling with tenderness, no redness or pus drainage. Pulses:  2+ Extremities:    No edema.  Neurologic:  alert & oriented X3, cranial nerves II-XII intact, strength normal in all extremities, sensation intact to light touch, and gait normal.     Impression & Recommendations:  Problem # 1:  HYPERTENSION (ICD-401.9) Assessment Improved Her BP improved, but it seem that she may have the side effects from HCTZ, so will stop HCTZ and increase lisinopril. Will recheck BP at next visit. Will check her CMET at this visit.   The following medications were removed from the medication list:    Lisinopril-hydrochlorothiazide 20-25 Mg Tabs (Lisinopril-hydrochlorothiazide) .Marland Kitchen... Take 1 tablet by mouth once a day Her updated medication list for this problem includes:    Lisinopril 40 Mg Tabs (Lisinopril) .Marland Kitchen... Take 1 tablet by mouth once a day  Orders: T-Comprehensive Metabolic Panel ([id number])  BP today: 140/90 Prior BP: 152/102  (07/26/2009)  Labs Reviewed: K+: 3.4 (07/26/2009) Creat: : 0.9 (07/26/2009)     Problem # 2:  HASHIMOTO'S THYROIDITIS (ICD-245.2) Assessment: Unchanged She has Hx of Hashimoto's thyroiditis, have not checked TSH for several years, not on any meds, will recheck TSH.  Orders: T-TSH ([id number])  Problem # 3:  THUMB PAIN, RIGHT (ICD-729.5) Assessment: New  She has right thumb swelling with pain but no erythema after she takes lisinopril-HCTZ, no injury. This may be due to HCTZ-associated gout or bursitis, will stop HCTZ and observe, check uric acid. She has been taking prednisone, and the symptom should be better if it is gout.  If no improvement in 2-3 weeks, may need X-ray and tap.   Orders: T-Uric Acid (Blood) ([id number])  Problem # 4:  SMOKER (ICD-305.1)  Encouraged smoking cessation and discussed different methods for smoking cessation.   Complete Medication List: 1)  Prednisone 10 Mg Tabs (Prednisone) .... Take 20mg  of prednisone a day 2)  Lisinopril 40 Mg Tabs (Lisinopril) .... Take 1 tablet by mouth once a day  Patient Instructions: 1)  Please schedule a follow-up appointment in 1 month. 2)  Will call you if any abnormal labs. 3)  Tobacco is very bad for your health and your loved ones! You Should stop smoking!. 4)  Stop Smoking Tips: Choose a Quit date. Cut down before the Quit date. decide what you will  do as a substitute when you feel the urge to smoke(gum,toothpick,exercise). 5)  It is important that you exercise regularly at least 20 minutes 5 times a week. If you develop chest pain, have severe difficulty breathing, or feel very tired , stop exercising immediately and seek medical attention. 6)  Check your Blood Pressure regularly. If it is above: you should make an appointment. Prescriptions: LISINOPRIL 40 MG TABS (LISINOPRIL) Take 1 tablet by mouth once a day  #31 x 6   Entered by:   Jackson Latino MD   Authorized by:   Clerance Lav MD   Signed by:    Jackson Latino MD on 08/19/2009   Method used:   [facility] to        [facility] [phone]* (retail)       [address]       [place], [place]  [address]       Ph: [phone]       Fax: [phone]   RxID:   [id number]  Process Orders Check Orders Results:     [facility]: ABN not required for this insurance Tests Sent for requisitioning (August 19, 2009 4:31 PM):     08/19/2009: [facility] -- T-Comprehensive Metabolic Panel [[id number]] (signed)     08/19/2009: [facility] -- T-TSH [phone] (signed)     08/19/2009: [facility] -- T-Uric Acid (Blood) [phone] (signed)    Prevention & Chronic Care Immunizations   Influenza vaccine: Not documented    Tetanus booster: Not documented    Pneumococcal vaccine: Not documented  Other Screening   Pap smear: NEGATIVE FOR INTRAEPITHELIAL LESIONS OR MALIGNANCY.  (06/18/2009)   Pap smear action/deferral: Ordered  (06/18/2009)   Pap smear due: 06/18/2010    Mammogram: ASSESSMENT: Negative - BI-RADS 1^MM DIGITAL SCREENING  (07/01/2009)   Mammogram action/deferral: Ordered  (06/18/2009)   Smoking status: current  (06/18/2009)   Smoking cessation counseling: yes  (08/19/2009)  Lipids   Total Cholesterol: Not documented   LDL: Not documented   LDL Direct: Not documented   HDL: Not documented   Triglycerides: Not documented  Hypertension   Last Blood Pressure: 140 / 90  (08/19/2009)   Serum creatinine: 0.9  (07/26/2009)   Serum potassium 3.4  (07/26/2009) CMP ordered     Hypertension flowsheet reviewed?: Yes   Progress toward BP goal: Improved  Self-Management Support :   Personal Goals (by the next clinic visit) :      Personal blood pressure goal: 140/90  (06/18/2009)   Patient will work on the following items until the next clinic visit to reach self-care goals:     Medications and monitoring: take my  medicines every day, bring all of my medications to every visit  (08/19/2009)     Eating: eat more vegetables, use fresh or frozen vegetables, eat foods that are low in salt, eat baked foods instead of fried foods, eat fruit for snacks and desserts, limit or avoid alcohol  (08/19/2009)     Activity: take the stairs instead of the elevator  (08/19/2009)    Hypertension self-management support: Resources for patients handout  (08/19/2009)      Resource handout printed.

## 2010-05-29 NOTE — Progress Notes (Signed)
Summary: Cellcept side affects  Phone Note Call from Patient   Summary of Call: Pt walked in and wanted to discuss her cellcept.  She just picked it up today and has not started taking it yet, she also has not decreased her prednisone.  She did have her labs drawn today.  She has alot of bloating and issues with food not feeling like it wants to stay down, she also has fatigue and dizziness and read that the side affects of cellcept are the same as she is having now.  She wonders should she go ahead and take the cellcept.  She has ROV on 6/14. Initial call taken by: Chales Abrahams CMA [name]),  Sep 18, 2009 3:12 PM  Follow-up for Phone Call        yes, I want her to start the cellcept and taper steroids as I had described in previous note Follow-up by: Rachael Fee MD,  Sep 18, 2009 8:37 PM  Additional Follow-up for Phone Call Additional follow up Details #1::        Pt. notified of Dr.Jacobs orders. Additional Follow-up by: Teryl Lucy RN,  Sep 19, 2009 8:54 AM

## 2010-05-29 NOTE — Assessment & Plan Note (Signed)
Summary: [name] MONTH F/U VISIT/CH   Vital Signs:  Patient profile:   45 year old female Height:      61.5 inches (156.21 cm) Weight:      167.3 pounds (76.05 kg) BMI:     31.21 Temp:     97.7 degrees F oral Pulse rate:   68 / minute BP sitting:   115 / 76  (right arm) Cuff size:   regular  Vitals Entered By: Chinita Pester RN (January 30, 2010 3:12 PM) CC: Sharp, intermittent  pain upper back/neck x 2-3 moths. Is Patient Diabetic? No Pain Assessment Patient in pain? no      Nutritional Status BMI of > 30 = obese  Have you ever been in a relationship where you felt threatened, hurt or afraid?No   Does patient need assistance? Functional Status Self care Ambulation Normal   Primary Care Provider:  Lyn Hollingshead, MD  CC:  Lambert Mody and intermittent  pain upper back/neck x 2-3 moths..  History of Present Illness: Alexandria White is a 45 y/o woman with P/H of autoimmune hepatitis, HTN,  and GERD.  1) She presents to the clinic for a well check visit. She states she was seen by Dr. Christella Hartigan who manages her autoimmune hepatitis. Patient is now on prednisone 30 mg by mouth once daily as well as cellcept for chronic management. Per Dr. Larae Grooms note, this dose does not seem to be controlling her liver dysfunction adequately, and he has been in constant touch with Dr. Katrinka Blazing, hepatologist at [facility]. Patient has been referred to Dr. Katrinka Blazing for further evaluation for this problem.  2) Acne - patient was given a prescription for erythromycin gel and she states her skin has improved.  3) GERD - Pt states she still occasionally wakes up at night with dry cough. She is wondering if this is secondary to her acid reflux.   Patient denies abdominal pain, nausea, vomiting, changes in bowel and urinary habits.     Depression History:      The patient denies a depressed mood most of the day and a diminished interest in her usual daily activities.         Preventive Screening-Counseling &  Management  Alcohol-Tobacco     Alcohol drinks/day:  0     Smoking Status: current     Smoking Cessation Counseling: yes     Packs/Day: 4 cigs per day     Year Started: 1983  Caffeine-Diet-Exercise     Caffeine use/day: 1     Does Patient Exercise: no  Current Medications (verified): 1)  Lisinopril 40 Mg Tabs (Lisinopril) .... Take 1 Tablet By Mouth Once A Day 2)  Cellcept 250 Mg Caps (Mycophenolate Mofetil) .... Take 4 Capsules in The Am and 4 Capsule in The Pm 3)  Hydrochlorothiazide 25 Mg Tabs (Hydrochlorothiazide) .... Take 1 Tablet By Mouth Once A Day 4)  Prednisone 10 Mg Tabs (Prednisone) .... Take 3 Pills A Day 5)  Omeprazole 20 Mg Cpdr (Omeprazole) .... Take One Pill By Mouth Once Daily For Acid Reflux. Take On An Empty Stomach Before Breakfast. 6)  Erythromycin 2 % Gel (Erythromycin) .... Please Apply To Area Where Acne Is Twice Daily.  Allergies (verified): 1)  ! * Latex 2)  ! Aspirin  Past History:  Past Medical History: Last updated: 06/18/2009 GERD Hypertension Headache, migraine + tension, daily Hemorrhoids Autoimmune Hepatitis   Past Surgical History: Last updated: 03/08/2009 cholecystecomy 1992, laparoscopically (unclear reasons)  was at [facility] and  she was jaundiced back then as well, does not recall ERCP around the time.  Family History: Last updated: 06/18/2009 Family History Diabetes 1st degree relative Family History Hypertension Family History High cholesterol Family History Thyroid disease Brother deceased from "brain cancer" Gout  Family Hsitory Headaches (Migraines)  Social History: Last updated: 11/28/2009  Applied for disability but was denied, currently appealing, working at [employer] alone, in a long-term relationship  6 children - living with one child currently, 91 years old, rest are grown Smokes 4 cig daily, has cut back from 1 PPD No alcohol or illicit drug use   Risk Factors: Alcohol Use:  0 (01/30/2010) Caffeine  Use: 1 (01/30/2010) Exercise: no (01/30/2010)  Risk Factors: Smoking Status: current (01/30/2010) Packs/Day: 4 cigs per day (01/30/2010)  Review of Systems      See HPI  Physical Exam  General:  alert and well-developed.  VS reviewed  Head:  normocephalic and atraumatic.   Eyes:  vision grossly intact, pupils equal, pupils round, and pupils reactive to light.   Neck:  supple.   Lungs:  normal respiratory effort and normal breath sounds.   Heart:  normal rate and regular rhythm.   Abdomen:  soft, non-tender, normal bowel sounds, and no distention.   Skin:  scar formation from prior acne and cysts on face whitehead comedomes on upper back, lower portion of neck, tender to touch, non-erythematous, no pus or drainage, about 0.5 mm in size   Impression & Recommendations:  Problem # 1:  CYSTIC ACNE (ICD-706.1) Assessment Improved Advised patient to use warm compresses. Will continue erythromycin gel for now.  Her updated medication list for this problem includes:    Erythromycin 2 % Gel (Erythromycin) .Marland Kitchen... Please apply to area where acne is twice daily.  Problem # 2:  CIRRHOSIS (ICD-571.5) Assessment: Comment Only Secondary to autoimmune hepatitis. Current regimen per Dr. Christella Hartigan and Dr. Katrinka Blazing includes prednisone 30 mg once daily and cellcept 2 g daily. LFTs are showing very little response to current therapy and thus per Dr. Christella Hartigan note, pt will be undergoing liver biopsy.   Problem # 3:  HYPERTENSION (ICD-401.9) Assessment: Improved Well controlled, continue current regimen. Labs up to date.  Her updated medication list for this problem includes:    Lisinopril 40 Mg Tabs (Lisinopril) .Marland Kitchen... Take 1 tablet by mouth once a day    Hydrochlorothiazide 25 Mg Tabs (Hydrochlorothiazide) .Marland Kitchen... Take 1 tablet by mouth once a day  BP today: 115/76 Prior BP: 120/92 (01/28/2010)  Labs Reviewed: K+: 3.5 (11/26/2009) Creat: : 0.9 (11/26/2009)     Problem # 4:  GERD  (ICD-530.81) Assessment: Unchanged Still complaining of dry cough. Discussed patient's diet which consists of high caffeine use, as well as pizza, and spagetti. I advised patient she should decrease her consumption of caffeine, as well as tomato based foods. Also advised patient to quit smoking, and she states, she will continue to cut back. Increase PPI to 40 mg once daily. Of note: EGD done 2008 was normal, along with normal colonoscopy in 2008  Her updated medication list for this problem includes:    Omeprazole 40 Mg Cpdr (Omeprazole) .Marland Kitchen... Take 1 tablet by mouth once a day  Complete Medication List: 1)  Lisinopril 40 Mg Tabs (Lisinopril) .... Take 1 tablet by mouth once a day 2)  Cellcept 250 Mg Caps (Mycophenolate mofetil) .... Take 4 capsules in the am and 4 capsule in the pm 3)  Hydrochlorothiazide 25 Mg Tabs (Hydrochlorothiazide) .... Take 1 tablet  by mouth once a day 4)  Prednisone 10 Mg Tabs (prednisone)  .... Take 3 pills a day 5)  Omeprazole 40 Mg Cpdr (Omeprazole) .... Take 1 tablet by mouth once a day 6)  Erythromycin 2 % Gel (Erythromycin) .... Please apply to area where acne is twice daily.  Other Orders: Influenza Vaccine NON MCR ([id number])  Patient Instructions: 1)  Please follow up as needed in 3-6 months or sooner if necessary 2)  Please use a washcloth, put in microwave for 15-20 seconds, and apply to area. Please repeat this process anywhere from 15-20 times a day until resolved.  3)  Please go to pharmacy and purchase benzoyl peroxide cream.  Prescriptions: LISINOPRIL 40 MG TABS (LISINOPRIL) Take 1 tablet by mouth once a day  #31 x 6   Entered and Authorized by:   Melida Quitter MD   Signed by:   Melida Quitter MD on 01/30/2010   Method used:   [facility] to        [facility] [phone]* (retail)       [address]       [place], [place]  [address]       Ph: [phone]       Fax: [phone]   RxID:   [id number] OMEPRAZOLE  40 MG CPDR (OMEPRAZOLE) Take 1 tablet by mouth once a day  #31 x 6   Entered and Authorized by:   Melida Quitter MD   Signed by:   Melida Quitter MD on 01/30/2010   Method used:   [facility] to        [facility] [phone]* (retail)       [address]       [place], [place]  [address]       Ph: [phone]       Fax: [phone]   RxID:   [id number]   Prevention & Chronic Care Immunizations   Influenza vaccine: Fluvax Non-MCR  (01/30/2010)   Influenza vaccine deferral: Deferred  (12/27/2009)    Tetanus booster: Not documented    Pneumococcal vaccine: Not documented  Other Screening   Pap smear: NEGATIVE FOR INTRAEPITHELIAL LESIONS OR MALIGNANCY.  (06/18/2009)   Pap smear action/deferral: Ordered  (06/18/2009)   Pap smear due: 06/18/2010    Mammogram: ASSESSMENT: Negative - BI-RADS 1^MM DIGITAL SCREENING  (07/01/2009)   Mammogram action/deferral: Ordered  (06/18/2009)   Smoking status: current  (01/30/2010)   Smoking cessation counseling: yes  (01/30/2010)  Lipids   Total Cholesterol: Not documented   LDL: Not documented   LDL Direct: Not documented   HDL: Not documented   Triglycerides: Not documented  Hypertension   Last Blood Pressure: 115 / 76  (01/30/2010)   Serum creatinine: 0.9  (11/26/2009)   Serum potassium 3.5  (11/26/2009)  Self-Management Support :   Personal Goals (by the next clinic visit) :      Personal blood pressure goal: 140/90  (11/28/2009)   Patient will work on the following items until the next clinic visit to reach self-care goals:     Medications and monitoring: check my blood sugar, check my blood pressure, bring all of my medications to every visit  (01/30/2010)     Eating: use fresh or frozen vegetables, eat foods that are low in salt, eat baked foods instead of fried foods  (01/30/2010)     Activity: take a 30 minute walk every  day  (01/30/2010)    Hypertension self-management support: Written  self-care plan  (01/30/2010)   Hypertension self-care plan printed.   Nursing Instructions: Give Flu vaccine today      Influenza Vaccine    Vaccine Type: Fluvax Non-MCR    Site: right deltoid    Mfr: Sanofi Pasteur    Dose: 0.5 ml    Route: IM    Given by: Chinita Pester RN    Exp. Date: 05/26/2010    Lot #: [id number]    VIS given: 11/19/09 version given January 30, 2010.  Flu Vaccine Consent Questions    Do you have a history of severe allergic reactions to this vaccine? no    Any prior history of allergic reactions to egg and/or gelatin? no    Do you have a sensitivity to the preservative Thimersol? no    Do you have a past history of Guillan-Barre Syndrome? no    Do you currently have an acute febrile illness? no    Have you ever had a severe reaction to latex? yes    Vaccine information given and explained to patient? yes    Are you currently pregnant? no

## 2010-05-29 NOTE — Consult Note (Signed)
[facility]: [facility]   Imported By: Sherian Rein 03/19/2010 11:01:52  _____________________________________________________________________  External Attachment:    Type:   Image     Comment:   External Document

## 2010-05-29 NOTE — Assessment & Plan Note (Signed)
Review of gastrointestinal problems: 1. routine risk for colon cancer:  no polyps on colonoscopy 05/2006; next colonoscopy 2018 2. Gerd: normal EGD 05/2006; symptoms well controlled on PPI 3. 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  ANA negative, AMA negative, F actin Antibody  IgG Highly positive ( F actin antibody Reference Range:This ELISA assay is based on purified F-Actin IgGantibodies.  IgG antibodies to F-Actin are presentin approximately: 75% of patients with autoimmune hepatitis type 1, 65% with autoimmune cholangitis, 30% with primary biliary cirrhosis, and 2% of healthy population.) .  ferritin normal, hepatitis A, B, C were all negative. Monospot negative, EBV IgG positive. CT Scan showed no biliary dilation, masses. Essentially normal liver. MRCP/MRI October, 2010: cirrhosis noted, micronodular liver, no CBD stones, mild portal hypertensive changes. November, 2010 and liver biopsy showed "Active cirrhosis" I spoke with the pathologist and he said the findings were consistent with autoimmune disease.  December, 2010 started on prednisone and azathioprine at 50 mg a day. Total bili decreased from 7.4-1.9 in 4 weeks' time.  January 2011 azathioprine, mono therapy, liver tests seemed to increase slightly.  February, 2011: liver tests increased while decreasing prednisone and starting to rely on azathioprine therapy alone. She felt poorly. Azathioprine stopped and prednisone resumed at 20 mg a day. Liver tests improved quickly.  Referral to [facility] liver clinic arranged.   History of Present Illness Visit Type: Follow-up Visit Primary GI MD: Rob Bunting MD Primary Provider: [facility] Requesting Provider: n/a Chief Complaint: 2 month follow-up No GI complaints History of Present Illness:     met with Dr. Katrinka Blazing at [facility] liver clinic.  She is not aware of any med changes or recommendations.  Bloodwork was done.  Sounds like they  considered decreasing the prednisone a bit.  She has overall felt well except for headaches.  she is afraid to take Tylenol for fear of liver toxicity. She saw a new primary care physician about one month ago who changed her hypertensive medicine are round.  She has been taking prednisone 20 mg a day for at least 2-3 months now. Her weight is actually down 2 pounds in 2 months however she does admit to eating a lot more than usual.           Current Medications (verified): 1)  Prednisone 10 Mg  Tabs (Prednisone) .... Take 20mg  of Prednisone A Day 2)  Lisinopril-Hydrochlorothiazide 20-25 Mg Tabs (Lisinopril-Hydrochlorothiazide) .... Take 1 Tablet By Mouth Once A Day  Allergies (verified): 1)  ! * Latex 2)  ! Aspirin  Vital Signs:  Patient profile:   45 year old female Height:      61.5 inches Weight:      160.25 pounds BMI:     29.90 Pulse rate:   76 / minute Pulse rhythm:   regular BP sitting:   152 / 102  (left arm)  Vitals Entered By: Milford Cage NCMA (July 26, 2009 8:54 AM)  Physical Exam  Additional Exam:  Constitutional: generally well appearing Psychiatric: alert and oriented times 3 Abdomen: soft, non-tender, non-distended, normal bowel sounds No lower extremity edema   Impression & Recommendations:  Problem # 1:  cirrhosis, likely autoimmune hepatitis she has felt very well the past 2-3 months, this is while taking 20 mg of prednisone on a daily basis. We will get records sent from [facility] liver clinic Dr. Katrinka Blazing. She met with him about one month ago. I am interested to see what he  recommends in terms of other treatment options since she really didn't tolerate the azathioprine and was not helping her liver disease day in remission. She will get a basic set of liver tests today and if they are completely normal I will likely have her decrease her prednisone to 15 mg a day. She will return to me in 2 months and sooner if needed.  Other Orders: TLB-CMP  (Comprehensive Metabolic Pnl) (80053-[id number])  Patient Instructions: 1)  We will contact Dr. Michaelle Copas office at [facility] for recent office visit, lab results. 2)  You will get lab test(s) done today (cmet) and will possibly decrease you prednisone based on the labs. 3)  Tylenol is safe at 1/2 the usual doses recommended on box. 4)  Get back in to PCP clinic for BP med adjustments. 5)  The medication list was reviewed and reconciled.  All changed / newly prescribed medications were explained.  A complete medication list was provided to the patient / caregiver.

## 2010-05-29 NOTE — Assessment & Plan Note (Signed)
Summary: [name] MONTH F/U VISIT/CH   Vital Signs:  Patient profile:   45 year old female Height:      61.5 inches (156.21 cm) Weight:      167.7 pounds (76.23 kg) BMI:     31.29 Temp:     97.6 degrees F (36.44 degrees C) oral Pulse rate:   82 / minute BP sitting:   147 / 97  (right arm)  Vitals Entered By: Stanton Kidney Ditzler RN (December 27, 2009 11:19 AM) Is Patient Diabetic? No Pain Assessment Patient in pain? yes     Location: all over Intensity: 4-6 Type: aching Onset of pain  past 1-2 weeks Nutritional Status BMI of > 30 = obese Nutritional Status Detail appetite down  Have you ever been in a relationship where you felt threatened, hurt or afraid?denies   Does patient need assistance? Functional Status Self care Ambulation Normal Comments H/a x 2 days. Past 1-2 weeks legs swollen, weak, aching, nausea and no energy. Sees Dr Christella Hartigan .   Primary Care Agatha Duplechain:  Lyn Hollingshead, MD   History of Present Illness: Alexandria White is a 45 y/o woman with P/H of autoimmune cirrhosis, HTN,  GERD and R thumb pain.  She comes today to the clinic with Complaint of-  1)  R thumb pain: She has pain in her R thumb since 02/11. Pt went to hand surgeon, and was given a steroid cream and splint. She states the pain has improved.   2) Generalized aches, pains, chills, headache x2 weeks. Denies sick contacts. No fevers, no abdominal pain, no nausea or vomiting.  3) Acid reflux symptoms   4) Restarted prednisone 10 mg by mouth once daily for autoimmune hepatitis   Depression History:      The patient denies a depressed mood most of the day and a diminished interest in her usual daily activities.         Preventive Screening-Counseling & Management  Alcohol-Tobacco     Alcohol drinks/day:  0     Alcohol type: white liquor     Smoking Status: current     Smoking Cessation Counseling: yes     Packs/Day: 4 cigs per day     Year Started: 1983  Caffeine-Diet-Exercise     Caffeine  use/day: 1     Does Patient Exercise: no  Current Medications (verified): 1)  Lisinopril 40 Mg Tabs (Lisinopril) .... Take 1 Tablet By Mouth Once A Day 2)  Cellcept 250 Mg Caps (Mycophenolate Mofetil) .... Take 4 Capsules in The Am and 4 Capsule in The Pm 3)  Hydrochlorothiazide 25 Mg Tabs (Hydrochlorothiazide) .... Take 1 Tablet By Mouth Once A Day 4)  Prednisone 5 Mg Tabs (Prednisone) .... 2 By Mouth Once Daily  Allergies: 1)  ! * Latex 2)  ! Aspirin  Past History:  Past Medical History: Last updated: 06/18/2009 GERD Hypertension Headache, migraine + tension, daily Hemorrhoids Autoimmune Hepatitis   Past Surgical History: Last updated: 03/08/2009 cholecystecomy 1992, laparoscopically (unclear reasons)  was at [facility] and she was jaundiced back then as well, does not recall ERCP around the time.  Family History: Last updated: 06/18/2009 Family History Diabetes 1st degree relative Family History Hypertension Family History High cholesterol Family History Thyroid disease Brother deceased from "brain cancer" Gout  Family Hsitory Headaches (Migraines)  Social History: Last updated: 11/28/2009  Applied for disability but was denied, currently appealing, working at [employer] alone, in a long-term relationship  6 children - living with one child  currently, 56 years old, rest are grown Smokes 4 cig daily, has cut back from 1 PPD No alcohol or illicit drug use   Risk Factors: Alcohol Use:  0 (12/27/2009) Caffeine Use: 1 (12/27/2009) Exercise: no (12/27/2009)  Risk Factors: Smoking Status: current (12/27/2009) Packs/Day: 4 cigs per day (12/27/2009)  Review of Systems      See HPI  Physical Exam  General:  alert and well-developed.   Head:  normocephalic and atraumatic.   Eyes:  vision grossly intact, pupils equal, pupils round, and pupils reactive to light.   Mouth:  no erythema and no exudates.   Neck:  supple, full ROM, no masses, and no cervical  lymphadenopathy.   Lungs:  normal respiratory effort and normal breath sounds.   Heart:  normal rate and regular rhythm.   Abdomen:  soft, non-tender, and no distention.   Msk:  normal ROM.   Skin:  some scarring of what appears as acne, no new cystic lesions or comodome on face    Impression & Recommendations:  Problem # 1:  THUMB PAIN, RIGHT (ICD-729.5) Assessment Comment Only Resolved with topical corticosteroid and wrist/thumb splint.   Problem # 2:  CIRRHOSIS (ICD-571.5) Assessment: Comment Only Per Dr. Christella Hartigan. Now on dual therapy of cellcept and prednisone. Dr. Christella Hartigan is still seeking further information regarding medication therapy via Dr. Katrinka Blazing.   Problem # 3:  HYPERTENSION (ICD-401.9) Assessment: Comment Only Blood pressure elevated secondary to headache, generalized aches and pains.   Her updated medication list for this problem includes:    Lisinopril 40 Mg Tabs (Lisinopril) .Marland Kitchen... Take 1 tablet by mouth once a day    Hydrochlorothiazide 25 Mg Tabs (Hydrochlorothiazide) .Marland Kitchen... Take 1 tablet by mouth once a day  BP today: 147/97 Prior BP: 134/86 (11/28/2009)  Labs Reviewed: K+: 3.5 (11/26/2009) Creat: : 0.9 (11/26/2009)     Problem # 4:  GERD (ICD-530.81) Assessment: Comment Only Experiencing dysphagia. Pt states she had these symptoms before and was started on PPI which helped. Will restart PPI.   Her updated medication list for this problem includes:    Protonix 40 Mg Tbec (Pantoprazole sodium) .Marland Kitchen... Take 1 tablet by mouth once a day  Problem # 5:  CYSTIC ACNE (ICD-706.1) Patient complains of acne on her face. New onset. She states she has popped all of them, but prior to popping them, she states they were cystic with green/grey discharge and would like to be referred to dermatologist. I didn't see what appears to be cystic acne, but more like scars from the comedomes she has popped. I will start pt on topic erythromycin along with topical benzoyal peroxide and have  pt follow up with dermatologist.   Her updated medication list for this problem includes:    Erythromycin 2 % Gel (Erythromycin) .Marland Kitchen... Please apply to area where acne is twice daily.  Orders: Dermatology Referral (Derma)  Problem # 6:  FATIGUE (ICD-780.79) Patient states she has generalized aches accompanied with fatigue, and chills. Pt had TSH and free T4 evaluated which was within normal limits. She states her symptoms have worsened over the last two weeks. I suspect her symptoms are related to acute viral illness. Pt is immunosuppressed and is more susceptible for infection. She denies fevers, abdominal complaints, or urinary complaints. Advised patient to have plenty of rest and fluids. Provided a note to be off work for today.   Complete Medication List: 1)  Lisinopril 40 Mg Tabs (Lisinopril) .... Take 1 tablet by mouth once a day  2)  Cellcept 250 Mg Caps (Mycophenolate mofetil) .... Take 4 capsules in the am and 4 capsule in the pm 3)  Hydrochlorothiazide 25 Mg Tabs (Hydrochlorothiazide) .... Take 1 tablet by mouth once a day 4)  Prednisone 5 Mg Tabs (Prednisone) .... 2 by mouth once daily 5)  Protonix 40 Mg Tbec (Pantoprazole sodium) .... Take 1 tablet by mouth once a day 6)  Erythromycin 2 % Gel (Erythromycin) .... Please apply to area where acne is twice daily.  Patient Instructions: 1)  Please schedule a follow-up appointment in 1 month. 2)  Please buy [facility] brand benzoyl peroxide and apply to area of acne and follow directions 3)  Please apply topical erythromycin twice daily 4)  Please take Protonix once daily  Prescriptions: PROTONIX 40 MG TBEC (PANTOPRAZOLE SODIUM) Take 1 tablet by mouth once a day  #31 x 3   Entered and Authorized by:   Melida Quitter MD   Signed by:   Melida Quitter MD on 12/27/2009   Method used:   [facility]ronically to        [facility] [phone]* (retail)       [address]       [place], [place]  [address]       Ph:  [phone]       Fax: [phone]   RxID:   [id number] ERYTHROMYCIN 2 % GEL (ERYTHROMYCIN) Please apply to area where acne is twice daily.  #1 tube x 0   Entered and Authorized by:   Melida Quitter MD   Signed by:   Melida Quitter MD on 12/27/2009   Method used:   [facility] to        [facility] [phone]* (retail)       [address]       [place], [place]  [address]       Ph: [phone]       Fax: [phone]   RxID:   [id number]    Prevention & Chronic Care Immunizations   Influenza vaccine: Not documented   Influenza vaccine deferral: Deferred  (12/27/2009)    Tetanus booster: Not documented    Pneumococcal vaccine: Not documented  Other Screening   Pap smear: NEGATIVE FOR INTRAEPITHELIAL LESIONS OR MALIGNANCY.  (06/18/2009)   Pap smear action/deferral: Ordered  (06/18/2009)   Pap smear due: 06/18/2010    Mammogram: ASSESSMENT: Negative - BI-RADS 1^MM DIGITAL SCREENING  (07/01/2009)   Mammogram action/deferral: Ordered  (06/18/2009)   Smoking status: current  (12/27/2009)   Smoking cessation counseling: yes  (12/27/2009)  Lipids   Total Cholesterol: Not documented   LDL: Not documented   LDL Direct: Not documented   HDL: Not documented   Triglycerides: Not documented  Hypertension   Last Blood Pressure: 147 / 97  (12/27/2009)   Serum creatinine: 0.9  (11/26/2009)   Serum potassium 3.5  (11/26/2009)  Self-Management Support :   Personal Goals (by the next clinic visit) :      Personal blood pressure goal: 140/90  (11/28/2009)   Patient will work on the following items until the next clinic visit to reach self-care goals:     Medications and monitoring: take my medicines every day, check my blood pressure, bring all of my medications to every visit, weigh myself weekly  (12/27/2009)     Eating: drink diet soda or water instead of juice or soda, eat more vegetables, use  fresh or frozen vegetables, eat foods that are  low in salt, eat baked foods instead of fried foods, eat fruit for snacks and desserts, limit or avoid alcohol  (12/27/2009)     Activity: take a 30 minute walk every day  (12/27/2009)    Hypertension self-management support: Written self-care plan, Resources for patients handout  (12/27/2009)   Hypertension self-care plan printed.      Resource handout printed.

## 2010-05-29 NOTE — Progress Notes (Signed)
Summary: results/hla  Phone Note Call from Patient   Summary of Call: pt calls again today for results Initial call taken by: Marin Roberts RN,  November 19, 2009 3:32 PM

## 2010-05-29 NOTE — Patient Instructions (Signed)
Please follow up as needed. Please continue to take Prednisone as directed by Dr. Christella Hartigan Please follow up with hand doctor as scheduled.

## 2010-05-29 NOTE — Progress Notes (Signed)
Summary: Lab reminder  Phone Note Outgoing Call Call back at [facility] Phone [phone]   Call placed by: Chales Abrahams CMA [name]),  February 11, 2010 8:25 AM Summary of Call: pt called and reminded it is time for her to have labs.  She will come in tomorrow before her liver biopsy Initial call taken by: Chales Abrahams CMA [name]),  February 11, 2010 8:26 AM

## 2010-05-29 NOTE — Progress Notes (Signed)
Summary: lab reminder  Phone Note Outgoing Call Call back at Home Phone [phone]   Call placed by: Chales Abrahams CMA [name]),  December 16, 2009 1:38 PM Summary of Call: called to remind pt to have labs done Initial call taken by: Chales Abrahams CMA [name]),  December 16, 2009 1:38 PM

## 2010-05-29 NOTE — Progress Notes (Signed)
Summary: phone/gg  Phone Note Call from Patient   Caller: Patient Summary of Call: Pt request referral for dermatology.  She has several bumps on face and lips.  THey come and go but tend to be painful.  Onset for months. SHe tried to schedule with Dr Terri Piedra but needs referral from PCP Pt # [phone] Initial call taken by: Merrie Roof RN,  December 16, 2009 12:50 PM  Follow-up for Phone Call        I just saw her Aug 4th, and I did not appreciate any bumps or lesions on face, nor do I see previous records in chart about any dermatological process. I have no problem referring her, but I would like to see patient myself or by one of my collegues first before referring.  Follow-up by: Melida Quitter MD,  December 17, 2009 3:51 PM  Additional Follow-up for Phone Call Additional follow up Details #1::        Pt informed and will keep appointment Sept 2 Additional Follow-up by: Merrie Roof RN,  December 19, 2009 3:22 PM

## 2010-05-29 NOTE — Progress Notes (Signed)
Summary: Triage  Phone Note Call from Patient Call back at Home Phone [phone]   Caller: Patient Call For: Dr. Christella Hartigan Reason for Call: Talk to Nurse Summary of Call: Pt said she went to the dentist on Friday and also to the ER. Said she has had a terrible headache and wants to know of the prednisone or Azathioprine is causing the headaches Initial call taken by: Karna Christmas,  May 06, 2009 11:59 AM  Follow-up for Phone Call        left message on home  machine to call back Chales Abrahams CMA [name])  May 06, 2009 1:51 PM   left message on machine to call back at work number. Chales Abrahams CMA [name])  May 06, 2009 1:53 PM   Additional Follow-up for Phone Call Additional follow up Details #1::        Pt states she had a bad headache and went to  the ER and her BP was elevated and she had swollen ankles.  I advised her to call her PCP and get an appt with them about her BP and keep her appt with Dr Christella Hartigan on Fri. Additional Follow-up by: Chales Abrahams CMA [name]),  May 07, 2009 10:58 AM    Additional Follow-up for Phone Call Additional follow up Details #2::    prednisone can definitely cause edema, swelling.  ask her what dose she is on?  can probably taper it a bit if needed. Follow-up by: Rachael Fee MD,  May 07, 2009 11:07 AM  Additional Follow-up for Phone Call Additional follow up Details #3:: Details for Additional Follow-up Action Taken: 20 mg once daily until tomorrow then she starts on 10 mg. Dr Christella Hartigan verbalized the order of tapering to 10 mg today and pt agreed  Chales Abrahams CMA [facility])  May 07, 2009 11:10

## 2010-05-29 NOTE — Assessment & Plan Note (Signed)
Review of gastrointestinal problems: 1. routine risk for colon cancer:  no polyps on colonoscopy 05/2006; next colonoscopy 2018 2. Gerd: normal EGD 05/2006; symptoms well controlled on PPI 3. 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  ANA negative, AMA negative, F actin Antibody  IgG Highly positive ( F actin antibody Reference Range:This ELISA assay is based on purified F-Actin IgGantibodies.  IgG antibodies to F-Actin are presentin approximately: 75% of patients with autoimmune hepatitis type 1, 65% with autoimmune cholangitis, 30% with primary biliary cirrhosis, and 2% of healthy population.) .  ferritin normal, hepatitis A, B, C were all negative. Monospot negative, EBV IgG positive. CT Scan showed no biliary dilation, masses. Essentially normal liver. MRCP/MRI October, 2010: cirrhosis noted, micronodular liver, no CBD stones, mild portal hypertensive changes. November, 2010 and liver biopsy showed "Active cirrhosis" I spoke with the pathologist and he said the findings were consistent with autoimmune disease.  December, 2010 started on prednisone and azathioprine at 50 mg a day. Total bili decreased from 7.4-1.9 in 4 weeks' time.  January 2011 azathioprine, mono therapy, liver tests seemed to increase slightly.    History of Present Illness Visit Type: Follow-up Visit Primary GI MD: Rob Bunting MD Primary Provider: n/a Requesting Provider: n/a Chief Complaint: Vomiting History of Present Illness:      Her liver tests on azathioprine 50 mg solo therapy increased slightly  and so I double the azathioprine just a few days ago. Shortly after that medicine change she started having vomiting and nausea. She had repeat liver tests done just prior to this visit and these are in the same range as they were about a week ago. Not nearly as high as 2-3 months ago but clearly not normal. She is having pruritus again.    Sodium                    141  mEq/L                   135-145   Potassium                 3.5 mEq/L                   3.5-5.1   Chloride                  103 mEq/L                   96-112   Carbon Dioxide            31 mEq/L                    19-32   Glucose              [H]  103 mg/dL                   10-93   BUN                  [L]  5 mg/dL                     2-35   Creatinine                0.8 mg/dL                   5.7-3.2   Total Bilirubin      [  H]  1.7 mg/dL                   9.8-1.1   Alkaline Phosphatase [H]  134 U/L                     39-117   AST                  [H]  158 U/L                     0-37   ALT                  [H]  93 U/L                      0-35   Total Protein             7.3 g/dL                    9.1-4.7   Albumin                   3.5 g/dL                    8.2-9.5   Calcium                   9.1 mg/dL                   6.2-13.0   GFR                       100.35 mL/min               >60  Tests: (2) CBC Platelet w/Diff (CBCD)   White Cell Count          5.9 K/uL                    4.5-10.5   Red Cell Count       [L]  3.80 Mil/uL                 3.87-5.11   Hemoglobin                13.7 g/dL                   86.5-78.4   Hematocrit                42.3 %                      36.0-46.0   MCV                  [H]  111.1 H fl                  78.0-100.0     Rechecked and verified result.   MCHC                      32.5 g/dL                   69.6-29.5   RDW                       13.7 %  11.5-14.6   Platelet Count            247.0 K/uL              Current Medications (verified): 1)  Azathioprine 50 Mg  Tabs (Azathioprine) .... Two Times A Day  Allergies (verified): 1)  ! * Latex 2)  ! Aspirin  Vital Signs:  Patient profile:   45 year old female Height:      61.5 inches Weight:      160.13 pounds BMI:     29.87 Pulse rate:   84 / minute Pulse rhythm:   regular BP sitting:   180 / 128  (left arm) Cuff size:   regular  Vitals Entered By: June  McMurray CMA [name]) (May 28, 2009 3:15 PM)  Physical Exam  Additional Exam:  Constitutional: generally well appearing Psychiatric: alert and oriented times 3 Abdomen: soft, non-tender, non-distended, normal bowel sounds    Impression & Recommendations:  Problem # 1:  abnormal liver tests, likely autoimmune hepatitis when I initially started on prednisone she felt better much quickly her liver tests nearly normalized. Transitioning her to azathioprine has not really been successful in her liver tests had increased. I do think she indeed has autoimmune hepatitis I would like to get Dr. Michaelle Copas opinion at [facility] liver clinic was a [employer] manager on autoimmune hepatitis. My question for him is does she truly have autoimmune hepatitis based on a liver biopsy, serologic workup, response to prednisone described above. If he agrees I would also appreciate her assistance in managing her at least stabilizing her after that be happy to resume her care. In the meantime I am having her stop her azathioprine and restart prednisone at 20 mg once daily monotherapy.  Problem # 2:  HYPERTENSION (ICD-401.9) she is to be on lisinopril, hydrochlorothiazide several years ago but stopped it and has not been back to clinic. She is due for a new clinic visit later this month in the meantime I will start her on lisinopril 20 mg once daily.  Patient Instructions: 1)  Stop the azathiaprine. 2)  We will refer you to Dr. Leanora Cover at [facility] liver clinic (Do you really have autoimmune hepatitis?  Help managing it?) 3)  Restart prednisone at 20mg s a day, until seen by Dr. Katrinka Blazing at [facility]. 4)  Start lisinopril (called into pharmacy). 5)  Return to see Dr. Christella Hartigan in 2 months, sooner if needed. 6)  The medication list was reviewed and reconciled.  All changed / newly prescribed medications were explained.  A complete medication list was provided to the patient / caregiver. Prescriptions: LISINOPRIL 10 MG TABS  (LISINOPRIL) take one pill, once daily  #30 x 0   Entered and Authorized by:   Rachael Fee MD   Signed by:   Rachael Fee MD on 05/28/2009   Method used:   Electronically to        [facility] [address] [phone]* (retail)       [address]       [place], [place]  [address]       Ph: [phone] or [phone]       Fax: [phone]   RxID:   [id number]   Appended Document: Orders Update/    Clinical Lists Changes  Orders: Added new Test order of Misc. Referral (Misc. Ref) - Signed

## 2010-05-29 NOTE — Progress Notes (Signed)
Summary: follow-up  Phone Note Call from Patient Call back at Home Phone [phone]   Caller: Patient Call For: Dr. Christella Hartigan Reason for Call: Talk to Nurse Summary of Call: pt asking for a follow up appt on the 28th of this month, nothing is available... doesnt want to wait until Febuary... had an appt, but canceled Initial call taken by: Vallarie Mare,  May 20, 2009 3:02 PM  Follow-up for Phone Call        left message on machine to call back Chales Abrahams CMA [name])  May 20, 2009 3:53 PM   pt returned call and will come in tomorrow for blood work that is past due and after Dr Christella Hartigan reviews I will get her scheduled as Dr Christella Hartigan recommends Follow-up by: Chales Abrahams CMA [name]),  May 20, 2009 4:25 PM

## 2010-05-29 NOTE — Letter (Signed)
Summary: [facility]  [facility]   Imported By: Lester Vanderbilt 01/28/2010 07:35:41  _____________________________________________________________________  External Attachment:    Type:   Image     Comment:   External Document

## 2010-05-29 NOTE — Assessment & Plan Note (Signed)
Review of gastrointestinal problems: 1. routine risk for colon cancer:  no polyps on colonoscopy 05/2006; next colonoscopy 2018 2. Gerd: normal EGD 05/2006; symptoms well controlled on PPI 3. 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  ANA negative, AMA negative, F actin Antibody  IgG Highly positive ( F actin antibody Reference Range:This ELISA assay is based on purified F-Actin IgGantibodies.  IgG antibodies to F-Actin are presentin approximately: 75% of patients with autoimmune hepatitis type 1, 65% with autoimmune cholangitis, 30% with primary biliary cirrhosis, and 2% of healthy population.) .  ferritin normal, hepatitis A., B., C. were all negative. Monospot negative, EBV IgG positive. CT Scan showed no biliary dilation, masses. Essentially normal liver. MRCP/MRI October, 2010: cirrhosis noted, micronodular liver, no CBD stones, mild portal hypertensive changes. November, 2010 and liver biopsy showed "Active cirrhosis" I spoke with the pathologist and he said the findings were consistent with autoimmune disease.  December, 2010 started on prednisone and azathioprine at 50 mg a day. Total bili decreased from 7.4-1.9 in 4 weeks' time.   History of Present Illness Visit Type: Follow-up Visit Primary GI MD: Rob Bunting MD Primary Provider: n/a Requesting Provider: n/a Chief Complaint: 4 week F/U History of Present Illness:     started having red rectal bleeding twice today. Had alot of gas yesterday.  No nausea no vomitting. This was pure red blood, sounds like a fairly small amount however.  She has had no abdominal pains.    she has no orthostatic symptoms, no chest pain, no shortness of breath.  The headaches she had is mainly gone.  Currently on 10mg  prednisone and azathiaprine 50mg  a day.           Current Medications (verified): 1)  Prednisone 10 Mg  Tabs (Prednisone) .... Take 3 Pills  A Day 2)  Azathioprine 50 Mg  Tabs  (Azathioprine) .... Take One Pill A Day  Allergies (verified): 1)  ! * Latex 2)  ! Aspirin  Vital Signs:  Patient profile:   45 year old female Height:      61.5 inches Weight:      156 pounds BMI:     29.10 BSA:     1.71 Pulse rate:   82 / minute Pulse rhythm:   regular BP sitting:   142 / 86  (left arm)  Vitals Entered By: Merri Ray CMA [name]) (May 10, 2009 3:43 PM)  Physical Exam  Additional Exam:  Constitutional: generally well appearing Psychiatric: alert and oriented times 3 Abdomen: soft, non-tender, non-distended, normal bowel sounds rectal examination with female assistant in room: very small external hemorrhoidal-type tissue, smooth, somewhat engorged feeling internal hemorrhoid tissue, no masses. Reddish stool was Hemoccult positive   Impression & Recommendations:  Problem # 1:  hemorrhoidal bleeding seems low volume however she will get a CBC today to make sure. She is on azathioprine, recently started. She does have cirrhosis but this does not seem like a upper GI bleed at all. Indeed she had an EGD in 2008 and no varices were noted. She also had a colonoscopy in 2008 and it was normal except for internal hemorrhoids.  She will return to see me next week and knows to call if bleeding gets worse over the weekend. I will call her in some Analpram ointment to be applied twice daily.  Other Orders: TLB-CBC Platelet - w/Differential (85025-CBCD)  Patient Instructions: 1)  You will get lab test(s) done today (cbc). 2)  Analpram ointment to  anus twice daily for 1 week. 3)  Return to see Dr. Christella Hartigan next week (will need CBC, cmet just prior). 4)  The medication list was reviewed and reconciled.  All changed / newly prescribed medications were explained.  A complete medication list was provided to the patient / caregiver. Prescriptions: ANALPRAM-HC SINGLES 1-2.5 %  CREA (HYDROCORTISONE ACE-PRAMOXINE) Apply to rectum 2 times per day  #60 x 2   Entered and  Authorized by:   Rachael Fee MD   Signed by:   Rachael Fee MD on 05/10/2009   Method used:   Electronically to        [facility] [address] [phone]* (retail)       [address]       [place], [place]  [address]       Ph: [phone] or [phone]       Fax: [phone]   RxID:   [id number]

## 2010-05-29 NOTE — Progress Notes (Signed)
Summary: results, radiology/ hla  Phone Note Call from Patient   Summary of Call: pt calls again for results, please advise! Initial call taken by: Marin Roberts RN,  November 18, 2009 8:44 AM  Follow-up for Phone Call        Called Pt, explained the hand xray result and will schedule her for MRI after confirming with Dr. Aundria Rud. Wasn't able to f/u with previous calls from pt, as the xray result was not on my desktop and I really didn't know where else to look. But Purnell Shoemaker did find it out and am really sorry for not asking anyone for help.

## 2010-05-29 NOTE — Progress Notes (Signed)
Summary: Records request from [facility]  Request for records received from [facility]. Request forwarded to [employer]. Alexandria White  June 03, 2009 12:55 PM

## 2010-05-29 NOTE — Progress Notes (Signed)
Summary: referral ?  Phone Note Call from Patient Call back at Home Phone [phone]   Caller: Patient Call For: Dr. Christella Hartigan Reason for Call: Talk to Nurse Summary of Call: would like phone number to referred office, Dr. Katrinka Blazing and needs to verify appt time with Dr. Katrinka Blazing Initial call taken by: Vallarie Mare,  December 31, 2009 8:15 AM  Follow-up for Phone Call        phone number given and appt time given  Follow-up by: Chales Abrahams CMA [name]),  December 31, 2009 8:20 AM     Appended Document: referral ? [phone]  Dr Katrinka Blazing

## 2010-05-29 NOTE — Progress Notes (Signed)
Summary: labs/ hla  Phone Note Call from Patient   Summary of Call: pt called for lab results, told if labs are abnormal she would have been called Initial call taken by: Marin Roberts RN,  December 05, 2009 4:31 PM

## 2010-05-29 NOTE — Progress Notes (Signed)
Summary: ? re meds prescribed by other doc  Phone Note Call from Patient Call back at Home Phone [phone]   Caller: Patient Call For: Christella Hartigan Reason for Call: Talk to Nurse Summary of Call: Patient was prescribed Oxycodone and and Doxcycline by her dentist and wants to know if it's ok for to take it since she has liver problems Initial call taken by: Tawni Levy,  July 08, 2009 10:45 AM  Follow-up for Phone Call        both should be OK, try to take as few of the pain meds as possible (enough to control her pain obviously) Follow-up by: Rachael Fee MD,  July 09, 2009 7:15 AM  Additional Follow-up for Phone Call Additional follow up Details #1::        left message on machine to call back Chales Abrahams CMA (AAMA)  July 09, 2009 8:16 AM   pt returned call and left message.  I called her but again got the voice mail.  left message on machine to call back Chales Abrahams CMA [name])  July 09, 2009 11:45 AM     Additional Follow-up for Phone Call Additional follow up Details #2::    pt aware Follow-up by: Chales Abrahams CMA [name]),  July 10, 2009 9:10 AM

## 2010-05-29 NOTE — Letter (Signed)
Summary: [facility]  [facility]   Imported By: Sherian Rein 09/12/2009 08:19:22  _____________________________________________________________________  External Attachment:    Type:   Image     Comment:   External Document

## 2010-05-29 NOTE — Letter (Signed)
Summary: [facility]  [facility]   Imported By: Sherian Rein 02/05/2010 11:51:11  _____________________________________________________________________  External Attachment:    Type:   Image     Comment:   External Document

## 2010-05-29 NOTE — Progress Notes (Signed)
Summary: Lab results  Phone Note Call from Patient Call back at Home Phone [phone]   Call For: Dr Christella Hartigan Reason for Call: Lab or Test Results Initial call taken by: Leanor Kail [facility],  December 24, 2009 11:06 AM  Follow-up for Phone Call        Patient  aware of results and to increase her prednisone to 10 mg.  She is notified that we will call her back with an appointment for Dr Katrinka Blazing, once we hear back from his office. Follow-up by: Darcey Nora RN, CGRN,  December 24, 2009 11:32 AM  Additional Follow-up for Phone Call Additional follow up Details #1::        spoke with [facility] and she is scheduled for 12/31/09 130 pm with Dr Katrinka Blazing I have left a message with the pt and will call again on Tue morning to be sure she has the message Additional Follow-up by: Chales Abrahams CMA [name]),  December 27, 2009 4:45 PM    New/Updated Medications: PREDNISONE 5 MG TABS (PREDNISONE) 2 by mouth once daily

## 2010-05-29 NOTE — Assessment & Plan Note (Signed)
Well controlled under current regimen. Will continue current regimen, as well as check labs.   Lab Results  Component Value Date   NA 139 05/29/2010   K 3.7 05/29/2010   BUN 10 05/29/2010    BP Readings from Last 3 Encounters:  05/29/10 143/83  01/30/10 115/76  01/28/10 120/92

## 2010-05-29 NOTE — Progress Notes (Signed)
Summary: Appt. at [facility]  Phone Note From Other Clinic   Caller: [facility]  [phone] Call For: Dr. Christella Hartigan Summary of Call: Has set appt. up for June 25, 2008 to see Dr. Katrinka Blazing @ [facility] Initial call taken by: Karna Christmas,  June 07, 2009 1:58 PM

## 2010-05-29 NOTE — Assessment & Plan Note (Signed)
Pt continues to experience excessive fatigue, which is likely related to autoimmune hepatitis. There is no recent CBC, thus will check CBC and B12 levels today.

## 2010-05-29 NOTE — Progress Notes (Signed)
Summary: labs  Phone Note Outgoing Call Call back at [facility] Phone [phone]   Call placed by: Chales Abrahams CMA [name]),  June 06, 2009 8:05 AM Summary of Call: called to remind pt to have labs she will be in this week. Initial call taken by: Chales Abrahams CMA [name]),  June 06, 2009 8:06 AM

## 2010-05-29 NOTE — Progress Notes (Signed)
Summary: speak to nurse re referral  Phone Note Call from Patient Call back at Home Phone [phone]   Caller: Patient Call For: JACOBS Reason for Call: Talk to Nurse Summary of Call: Patient states that she has not received her referral for [facility] and she was suppose to receive it last Friday Initial call taken by: Tawni Levy,  June 05, 2009 8:37 AM  Follow-up for Phone Call        Called Dr Hedwig Morton and inquired about the appt.  They will check on it and call back. Chales Abrahams CMA [name])  June 05, 2009 8:56 AM   Called Dr Katrinka Blazing back and records were refaxed.   Follow-up by: Chales Abrahams CMA [name]),  June 05, 2009 4:58 PM  Additional Follow-up for Phone Call Additional follow up Details #1::        Called Dr Lonn Georgia office again to make sure they have everything they need and pt has been scheduled.  I had to leave a message.  I also called the pt and informed her of the status. Chales Abrahams CMA [name])  June 06, 2009 11:00 AM     Additional Follow-up for Phone Call Additional follow up Details #2::    Pt schedueld for 07/04/09 Dr Michaelle Copas office will notify pt. Follow-up by: Chales Abrahams CMA [name]),  June 06, 2009 4:39 PM  Additional Follow-up for Phone Call Additional follow up Details #3:: Details for Additional Follow-up Action Taken: great Additional Follow-up by: Rachael Fee MD,  June 07, 2009 8:30 AM

## 2010-05-29 NOTE — Assessment & Plan Note (Signed)
Summary: EST-2 WEEK RECHECK/CH   Vital Signs:  Patient profile:   45 year old female Height:      61.5 inches (156.21 cm) Weight:      166.7 pounds (75.77 kg) BMI:     31.10 Temp:     97.6 degrees F oral Pulse rate:   95 / minute BP sitting:   134 / 86  (right arm)  Vitals Entered By: Chinita Pester RN (November 28, 2009 2:21 PM) CC: F/U for BP. Result of x-ray of right thumb - went to ED Sunday. Is Patient Diabetic? No Pain Assessment Patient in pain? yes     Location: right thumb Intensity: 6 Type: throbbing Onset of pain  Intermittent Nutritional Status BMI of > 30 = obese  Have you ever been in a relationship where you felt threatened, hurt or afraid?No   Does patient need assistance? Functional Status Self care Ambulation Normal   Primary Care Provider:  Lyn Hollingshead, MD  CC:  F/U for BP. Result of x-ray of right thumb - went to ED Sunday.Marland Kitchen  History of Present Illness: Alexandria White is a 45 y/o woman with P/H of autoimmune cirrhosis, HTN,  GERD and R thumb pain.  She comes today to the clinic with Complaint of-  1)  R thumb pain: She has pain in her R thumb since 02/11. The pain is getting worse. Its throbbing, pounding , 8-9/10, continous, gets worse with work and movement of the thumb. She denies morning stiffness or significant pain of any other joint. Patient had an xray of her thumb due to concern for necrosis 2/2 chronic steroid use. Xray was negative. Patient went to [facility] this past Sunday, but there was no further imaging done.   She does complaint of anorexia, nausea, weakness and increased sleep since 2-3 days.  Denies any fever, diarrhea, chest pain, abd pain, urine abnormalities, ascites.   Of note, patient saw Dr. Christella Hartigan of GI and had TSH checked for hypothyroidism in relation to excessive fatigue.    Depression History:      The patient denies a depressed mood most of the day.         Preventive Screening-Counseling &  Management  Alcohol-Tobacco     Alcohol drinks/day:  0     Alcohol type: white liquor     Smoking Status: current     Smoking Cessation Counseling: yes     Packs/Day: 4 cigs per day     Year Started: 1983  Caffeine-Diet-Exercise     Caffeine use/day: 1     Does Patient Exercise: no  Current Medications (verified): 1)  Lisinopril 40 Mg Tabs (Lisinopril) .... Take 1 Tablet By Mouth Once A Day 2)  Cellcept 250 Mg Caps (Mycophenolate Mofetil) .... Take 4 Capsules in The Am and 4 Capsule in The Pm 3)  Hydrochlorothiazide 25 Mg Tabs (Hydrochlorothiazide) .... Take 1 Tablet By Mouth Once A Day  Allergies (verified): 1)  ! * Latex 2)  ! Aspirin  Past History:  Past Medical History: Last updated: 06/18/2009 GERD Hypertension Headache, migraine + tension, daily Hemorrhoids Autoimmune Hepatitis   Past Surgical History: Last updated: 03/08/2009 cholecystecomy 1992, laparoscopically (unclear reasons)  was at [facility] and she was jaundiced back then as well, does not recall ERCP around the time.  Family History: Last updated: 06/18/2009 Family History Diabetes 1st degree relative Family History Hypertension Family History High cholesterol Family History Thyroid disease Brother deceased from "brain cancer" Gout  Family  Hsitory Headaches (Migraines)  Social History: Last updated: 11/28/2009  Applied for disability but was denied, currently appealing, working at [employer] alone, in a long-term relationship  6 children - living with one child currently, 80 years old, rest are grown Smokes 4 cig daily, has cut back from 1 PPD No alcohol or illicit drug use   Risk Factors: Alcohol Use:  0 (11/28/2009) Caffeine Use: 1 (11/28/2009) Exercise: no (11/28/2009)  Risk Factors: Smoking Status: current (11/28/2009) Packs/Day: 4 cigs per day (11/28/2009)  Social History:  Applied for disability but was denied, currently appealing, working at [employer] alone, in a  long-term relationship  6 children - living with one child currently, 6 years old, rest are grown Smokes 4 cig daily, has cut back from 1 PPD No alcohol or illicit drug use   Review of Systems      See HPI  Physical Exam  General:  alert and well-developed.   Head:  normocephalic and atraumatic.   Eyes:  vision grossly intact, pupils equal, pupils round, and pupils reactive to light.   Mouth:  good dentition.   Neck:  supple, full ROM, no masses, and no thyromegaly.   Lungs:  normal respiratory effort and normal breath sounds.   Heart:  normal rate, regular rhythm, no murmur, no gallop, and no rub.   Msk:  There is increased swelling of right thumb, no obvious deformities. Decreased strength and ROM secondary to pain.  Pulses:  pulses equal bilaterally  Extremities:  no LE edema present  Neurologic:  alert & oriented X3.     Impression & Recommendations:  Problem # 1:  THUMB PAIN, RIGHT (ICD-729.5) Assessment Unchanged As discussed with Dr. Rogelia Boga, patient does not have any obvious fracture, or necrosis of thumb, however due to the severity of pain and limitation, I will refer patient to hand surgeon for further evaluation.   Orders: Misc. Referral (Misc. Ref)  Problem # 2:  CIRRHOSIS (ICD-571.5) Assessment: Comment Only Followed by Dr. Christella Hartigan. Patient was recently tapered off steroids and continue on cellcept, however her LFTs are elevated, and thus Dr. Christella Hartigan feels that the cellcept is not keeping her liver disease in check and would like to restart prednisone at 5 mg low dose daily. He is following up with Dr. Katrinka Blazing from [facility] for further insight. Will follow GI recs.   Problem # 3:  HYPERTENSION (ICD-401.9) Assessment: Comment Only Blood pressure within stable range. Will continue current regiment. Labs reviewed. Renal function and electrolytes are stable.   Her updated medication list for this problem includes:    Lisinopril 40 Mg Tabs (Lisinopril) .Marland Kitchen... Take 1 tablet  by mouth once a day    Hydrochlorothiazide 25 Mg Tabs (Hydrochlorothiazide) .Marland Kitchen... Take 1 tablet by mouth once a day  BP today: 134/86 Prior BP: 110/64 (11/26/2009)  Labs Reviewed: K+: 3.5 (11/26/2009) Creat: : 0.9 (11/26/2009)     Problem # 4:  SMOKER (ICD-305.1) Assessment: Comment Only Pt has cut back from smoking 1 PPD to 4 or less cigarettes daily, and even goes days without smoking. Have encouraged patient to continue with progress, and will follow status. No anti-smoking agents given to patient.   Problem # 5:  FATIGUE (ICD-780.79) Assessment: New Patient reports chronic fatigue. Pt had TSH checked at Dr. Larae Grooms office and it was found to be low. In patient with hx of Hashimoto's and hyperthyroidism, will check free T4. Furthermore, patient has autoimmune cirrhosis of the liver, thus may find other autoimmune related illnesses.  Complete Medication List: 1)  Lisinopril 40 Mg Tabs (Lisinopril) .... Take 1 tablet by mouth once a day 2)  Cellcept 250 Mg Caps (Mycophenolate mofetil) .... Take 4 capsules in the am and 4 capsule in the pm 3)  Hydrochlorothiazide 25 Mg Tabs (Hydrochlorothiazide) .... Take 1 tablet by mouth once a day 4)  Prednisone 5 Mg Tabs (Prednisone) .Marland Kitchen.. 1 by mouth once daily  Other Orders: T-T4, Free [phone])  Patient Instructions: 1)  Please schedule a follow-up appointment in 1 month. 2)  Please follow up with Hand Surgeon as scheduled.   Prevention & Chronic Care Immunizations   Influenza vaccine: Not documented    Tetanus booster: Not documented    Pneumococcal vaccine: Not documented  Other Screening   Pap smear: NEGATIVE FOR INTRAEPITHELIAL LESIONS OR MALIGNANCY.  (06/18/2009)   Pap smear action/deferral: Ordered  (06/18/2009)   Pap smear due: 06/18/2010    Mammogram: ASSESSMENT: Negative - BI-RADS 1^MM DIGITAL SCREENING  (07/01/2009)   Mammogram action/deferral: Ordered  (06/18/2009)   Smoking status: current  (11/28/2009)   Smoking  cessation counseling: yes  (11/28/2009)  Lipids   Total Cholesterol: Not documented   LDL: Not documented   LDL Direct: Not documented   HDL: Not documented   Triglycerides: Not documented  Hypertension   Last Blood Pressure: 134 / 86  (11/28/2009)   Serum creatinine: 0.9  (11/26/2009)   Serum potassium 3.5  (11/26/2009)    Hypertension flowsheet reviewed?: Yes   Progress toward BP goal: Improved  Self-Management Support :   Personal Goals (by the next clinic visit) :      Personal blood pressure goal: 140/90  (11/28/2009)   Hypertension self-management support: BP self-monitoring log, Written self-care plan  (11/28/2009)   Hypertension self-care plan printed.  Process Orders Check Orders Results:     [facility]: ABN not required for this insurance Tests Sent for requisitioning (December 01, 2009 12:34 PM):     11/28/2009: [facility] -- [place], [place] [[phone]] (signed)

## 2010-05-29 NOTE — Assessment & Plan Note (Signed)
Autoimmune hepatitis deteriorated as illustrated by worsening LFTs. Pt was restarted on Prednisone 40 mg once daily, until further instructed by Dr. Christella Hartigan. She is still followed by [facility] hepatology.   Lab Results  Component Value Date   AST 909* 05/27/2010   ALT 591* 05/27/2010

## 2010-05-29 NOTE — Progress Notes (Signed)
Summary: refill/ hla  Phone Note Refill Request Message from:  Fax from Pharmacy on February 18, 2010 9:59 AM  Refills Requested: Medication #1:  HYDROCHLOROTHIAZIDE 25 MG TABS Take 1 tablet by mouth once a day   Last Refilled: 9/11 last labs 12/2009, last visit 01/2010  Initial call taken by: Marin Roberts RN,  February 18, 2010 9:59 AM  Follow-up for Phone Call        Refill approved-nurse to complete Follow-up by: Melida Quitter MD,  February 19, 2010 12:24 PM    Prescriptions: HYDROCHLOROTHIAZIDE 25 MG TABS (HYDROCHLOROTHIAZIDE) Take 1 tablet by mouth once a day  #30 x 6   Entered and Authorized by:   Melida Quitter MD   Signed by:   Melida Quitter MD on 02/19/2010   Method used:   [facility]        [facility] [phone]* (retail)       [address]       [place], [place]  [address]       Ph: [phone]       Fax: [phone]   RxID:   [id number]

## 2010-05-29 NOTE — Letter (Signed)
Summary: GI Office Note/[facility]  GI Office Note/[facility]   Imported By: Sherian Rein 04/03/2010 11:16:54  _____________________________________________________________________  External Attachment:    Type:   Image     Comment:   External Document

## 2010-05-29 NOTE — Progress Notes (Signed)
Summary: Refill Prednisone  Phone Note Call from Patient Call back at Home Phone [phone]   Caller: Patient Summary of Call: patient has not had any prednisone since last thursday, she needs a refill. In the note it looks like there is some questions as if she was going to be on 15mg  or continue on 20mg . Sorry [name] I forgot to get her pharm. Initial call taken by: Harlow Mares CMA [name]),  August 19, 2009 3:36 PM    Prescriptions: PREDNISONE 10 MG  TABS (PREDNISONE) take 20mg  of prednisone a day  #60 x 6   Entered by:   Chales Abrahams CMA (AAMA)   Authorized by:   Rachael Fee MD   Signed by:   Chales Abrahams CMA (AAMA) on 08/19/2009   Method used:   Electronically to        [facility] [phone]* (retail)       [address]       [place], [place]  [address]       Ph: [phone]       Fax: [phone]   RxID:   [id number]

## 2010-05-29 NOTE — Assessment & Plan Note (Signed)
Summary: ACUTE-CHILLS/HA'S/HAND PAIN/NAUSEA/([facility])/CFB   Vital Signs:  Patient profile:   45 year old female Height:      61.5 inches (156.21 cm) Weight:      168.2 pounds (76.45 kg) BMI:     31.38 Temp:     97.0 degrees F (36.11 degrees C) oral Pulse rate:   78 / minute BP sitting:   191 / 121  (right arm)  Vitals Entered By: Stanton Kidney Ditzler RN (November 07, 2009 9:59 AM)     Is Patient Diabetic? No Pain Assessment Patient in pain? yes     Location: right thumb and middle finger Intensity: 8 Type: sharp Onset of pain  past 5 months Nutritional Status BMI of > 30 = obese Nutritional Status Detail appetite down past 2 weeks - off Prednisone  Have you ever been in a relationship where you felt threatened, hurt or afraid?denies   Does patient need assistance? Functional Status Self care Ambulation Normal Comments Ck right hand - thumb and middle finger cont to hurt 5 months. Reck appt next week with Dr Christella Hartigan - since off Prednisone nausea x 2 weeks and appetite down. Having abd pain again as before starting Prednisone. Face breaking out since on new meds from Dr Gerilyn Pilgrim and doctor at [facility]. BP reck at 11AM right arm 187/114 pulse 78.   Primary Care Provider:  Melida Quitter MD   History of Present Illness: Alexandria White is a 45 y/o woman with P/H of autoimmune cirrhosis, HTN,  GERD and R thumb pain.  She comes today to the clinic with Complaint of-   R thumb pain: She has pain in her R thumb since 02/11. The pain is getting worse. Its throbbing, pounding , 8-9/10, continous, gets worse with work and movement of the thumb. She denies morning stiffness or significant pain of any other joint.  She does complaint of anorexia, nausea, weakness and increased sleep since 2-3 days.  Denies any fever, diarrhea, chest pain, abd pain, urine abnormalities, ascites.     Depression History:      The patient denies a depressed mood most of the day and a diminished interest in her usual  daily activities.         Preventive Screening-Counseling & Management  Alcohol-Tobacco     Alcohol drinks/day:  0     Alcohol type: white liquor     Smoking Status: current     Smoking Cessation Counseling: yes     Packs/Day: 4 cigs per day     Year Started: 1983  Caffeine-Diet-Exercise     Caffeine use/day: 1     Does Patient Exercise: no  Problems Prior to Update: 1)  Thumb Pain, Right  (ICD-729.5) 2)  Health Maintenance Exam  (ICD-V70.0) 3)  Cirrhosis  (ICD-571.5) 4)  Nonspec Elevation of Levels of Transaminase/ldh  (ICD-790.4) 5)  Hashimoto's Thyroiditis  (ICD-245.2) 6)  Menopause-related Vasomotor Symptoms, Hot Flashes  (ICD-627.2) 7)  Macrocytic Anemia  (ICD-281.9) 8)  Hyperthyroidism, Subclinical  (ICD-242.90) 9)  Delayed Menses  (ICD-626.8) 10)  Family History Diabetes 1st Degree Relative  (ICD-V18.0) 11)  Smoker  (ICD-305.1) 12)  Hypertension  (ICD-401.9) 13)  Gerd  (ICD-530.81) 14)  Headache  (ICD-784.0)  Medications Prior to Update: 1)  Prednisone 10 Mg  Tabs (Prednisone) .... Take 10mg  of Prednisone A Day 2)  Lisinopril 40 Mg Tabs (Lisinopril) .... Take 1 Tablet By Mouth Once A Day 3)  Cellcept 250 Mg Caps (Mycophenolate Mofetil) .... Take 4 Capsules in The Am and  4 Capsule in The Pm  Current Medications (verified): 1)  Lisinopril 40 Mg Tabs (Lisinopril) .... Take 1 Tablet By Mouth Once A Day 2)  Cellcept 250 Mg Caps (Mycophenolate Mofetil) .... Take 4 Capsules in The Am and 4 Capsule in The Pm 3)  Hydrochlorothiazide 25 Mg Tabs (Hydrochlorothiazide) .... Take 1 Tablet By Mouth Once A Day  Allergies: 1)  ! * Latex 2)  ! Aspirin  Social History: Packs/Day:  4 cigs per day  Review of Systems       as per HPI.Marland Kitchen  Physical Exam  General:  alert, well-developed, well-nourished, well-hydrated, and overweight-appearing.   Head:  normocephalic and atraumatic.   Lungs:  normal respiratory effort, normal breath sounds, no crackles, and no wheezes.   Heart:   normal rate, regular rhythm, no murmur, and no rub.   Abdomen:  soft, non-tender, normal bowel sounds, and no distention.   has a scar from  open cholecystectomy. Additional Exam:  Hand exam  R hand:    Inspection: No redness, swelling, scar or trauma.                 Palpation:   Severe tenderness at the base of R thumb and also at the proximal 1st phalanx. L hand:    Normal    Impression & Recommendations:  Problem # 1:  THUMB PAIN, RIGHT (ICD-729.5) Assessment Deteriorated Pt has R thumb pain since last 5 months, which is getting worse. On exam , there is no redness, swelling or trauma, but there is severe tenderness above and below the PIP joint. She was on Prednisone for her autoimmune cirrhosis since 03/2009, and got tapered off before 2 weeks. She doesn't have significant pain in any other joints and so she  has a monoarticular problem.  This and absence of inflammatory signs puts RA and other inflammatory athritis and OA low down the list of DD. Also absence of fever and redness puts infective arthritis low.   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 We will do an x-ray hand film as recommended by the radiologist, and will further do an MRI if needed for her Dx.  Will f/u with pt as per the x ray results.  Problem # 2:  HYPERTENSION (ICD-401.9) Her BP today was 191/121 mmhg and after 20 min it was 187/117. It is significantly high then her previous result last month. She is on Mycophenolate, which can  increased BP in 28-78% pt as per Uptodate. She needs to have an immunosuppressant med for her Autoimmune cirrhosis which seems to be well controlled , as her chemistry result. So will add HCTZ 25 mg once daily and will reassess this at her next visit.  Her updated medication list for this problem includes:    Lisinopril 40 Mg Tabs (Lisinopril) .Marland Kitchen... Take 1 tablet by mouth once a day     Hydrochlorothiazide 25 Mg Tabs (Hydrochlorothiazide) .Marland Kitchen... Take 1 tablet by mouth once a day  BP today: 191/121 Prior BP: 136/84 (10/08/2009)  Labs Reviewed: K+: 3.6 (10/08/2009) Creat: : 0.8 (10/08/2009)     Complete Medication List: 1)  Lisinopril 40 Mg Tabs (Lisinopril) .... Take 1 tablet by mouth once a day 2)  Cellcept 250 Mg Caps (Mycophenolate mofetil) .... Take 4 capsules in the am and 4 capsule in the pm 3)  Hydrochlorothiazide 25 Mg Tabs (Hydrochlorothiazide) .... Take 1 tablet by mouth once  a day  Other Orders: Diagnostic X-Ray/Fluoroscopy (Diagnostic X-Ray/Flu)  Patient Instructions: 1)  Please schedule a follow-up appointment in 2 weeks. 2)  For your Right thumb pain, we will get an xray done today and will call you for the further tests we need to do or any medicines we need to start. 3)  If needed we will make an early appointment for you.  Prescriptions: HYDROCHLOROTHIAZIDE 25 MG TABS (HYDROCHLOROTHIAZIDE) Take 1 tablet by mouth once a day  #30 x 2   Entered and Authorized by:   Lyn Hollingshead   Signed by:   Lyn Hollingshead on 11/07/2009   Method used:   [facility]ron[facility] to        [facility] [phone]* (retail)       [address]       [place], [place]  [address]       Ph: [phone]       Fax: [phone]   RxID:   [id number]    Prevention & Chronic Care Immunizations   Influenza vaccine: Not documented    Tetanus booster: Not documented    Pneumococcal vaccine: Not documented  Other Screening   Pap smear: NEGATIVE FOR INTRAEPITHELIAL LESIONS OR MALIGNANCY.  (06/18/2009)   Pap smear action/deferral: Ordered  (06/18/2009)   Pap smear due: 06/18/2010    Mammogram: ASSESSMENT: Negative - BI-RADS 1^MM DIGITAL SCREENING  (07/01/2009)   Mammogram action/deferral: Ordered  (06/18/2009)   Smoking status: current  (11/07/2009)   Smoking cessation counseling: yes  (11/07/2009)  Lipids   Total Cholesterol: Not documented   LDL:  Not documented   LDL Direct: Not documented   HDL: Not documented   Triglycerides: Not documented  Hypertension   Last Blood Pressure: 191 / 121  (11/07/2009)   Serum creatinine: 0.8  (10/08/2009)   Serum potassium 3.6  (10/08/2009)    Hypertension flowsheet reviewed?: Yes   Progress toward BP goal: Deteriorated   Hypertension comments: She had a significant rise in her BP at two seperate occassion today, 191/121 and 187/117 20 min later. May be due to Mycophenolate. Added HCTZ 25 mg once daily.  Self-Management Support :   Personal Goals (by the next clinic visit) :      Personal blood pressure goal: 140/90  (06/18/2009)   Patient will work on the following items until the next clinic visit to reach self-care goals:     Medications and monitoring: take my medicines every day, check my blood pressure, bring all of my medications to every visit  (11/07/2009)     Eating: eat more vegetables, use fresh or frozen vegetables, eat foods that are low in salt, eat baked foods instead of fried foods, eat fruit for snacks and desserts, limit or avoid alcohol  (11/07/2009)     Activity: take a 30 minute walk every day  (11/07/2009)    Hypertension self-management support: Written self-care plan, Education handout, Resources for patients handout  (11/07/2009)   Hypertension self-care plan printed.   Hypertension education handout printed      Resource handout printed.

## 2010-05-29 NOTE — Assessment & Plan Note (Signed)
Review of gastrointestinal problems: 1. routine risk for colon cancer:  no polyps on colonoscopy 05/2006; next colonoscopy 2018 2. Gerd: normal EGD 05/2006; symptoms well controlled on PPI 3. 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  ANA negative, AMA negative, F actin Antibody  IgG Highly positive ( F actin antibody Reference Range:This ELISA assay is based on purified F-Actin IgGantibodies.  IgG antibodies to F-Actin are presentin approximately: 75% of patients with autoimmune hepatitis type 1, 65% with autoimmune cholangitis, 30% with primary biliary cirrhosis, and 2% of healthy population.) .  ferritin normal, hepatitis A, B, C were all negative. Monospot negative, EBV IgG positive. CT Scan showed no biliary dilation, masses. Essentially normal liver. MRCP/MRI October, 2010: cirrhosis noted, micronodular liver, no CBD stones, mild portal hypertensive changes. November, 2010 and liver biopsy showed "Active cirrhosis" I spoke with the pathologist and he said the findings were consistent with autoimmune disease.  December, 2010 started on prednisone and azathioprine at 50 mg a day. Total bili decreased from 7.4-1.9 in 4 weeks' time.  January 2011 azathioprine, mono therapy, liver tests seemed to increase slightly.  February, 2011: liver tests increased while decreasing prednisone and starting to rely on azathioprine therapy alone. She felt poorly. Azathioprine stopped and prednisone resumed at 20 mg a day. Liver tests improved quickly.  Referral to [facility] liver clinic arranged.    History of Present Illness Visit Type: Follow-up Visit Primary GI MD: Rob Bunting MD Primary Provider: n/a Requesting Provider: n/a Chief Complaint: Nausea History of Present Illness:     very pleasant 45 year old woman whom I last saw her earlier this month. At that point I decided to stop her azathioprine and rely solely on prednisone for her likely autoimmune  hepatitis. He did not seem that azathioprine was helping as I try to transition her from prednisone to azathioprine. She is scheduled to meet with [facility] liver clinic Dr. Corey Skains next week sometime.    Since changing off of the azathioprine and relying only on prednisone she feels much better overall.  Nlo itching, feels well.  No jaundice, no pains.  no nausea or vomitting. her transaminases went from the 100-200 range down to 40-50. Her total bilirubin dropped from 1.9-1.3.           Current Medications (verified): 1)  Lisinopril 10 Mg Tabs (Lisinopril) .... Take One Pill, Once Daily 2)  Prednisone 10 Mg  Tabs (Prednisone) .... Take 20mg  of Prednisone A Day  Allergies (verified): 1)  ! * Latex 2)  ! Aspirin  Vital Signs:  Patient profile:   45 year old female Height:      61.5 inches Weight:      162.50 pounds BMI:     30.32 Pulse rate:   60 / minute Pulse rhythm:   regular BP sitting:   174 / 108  (left arm) Cuff size:   regular  Vitals Entered By: June McMurray CMA [name]) (June 17, 2009 8:32 AM)  Physical Exam  Additional Exam:  Constitutional: generally well appearing Psychiatric: alert and oriented times 3 Abdomen: soft, non-tender, non-distended, normal bowel sounds    Impression & Recommendations:  Problem # 1:  Autoimmune liver disease, cirrhosis repeat liver testing shows that she is responding better to solo prednisone therapy. Obviously this is not good for her long-term. She did not seem to respond to immunomodulators. She will be meeting with [facility] liver clinic physician next week. She will get repeat set of liver tests today.  I am very happy to resume her liver care after Dr. Michaelle Copas input, recommendations on diagnosis, treatment.  Problem # 2:  HYPERTENSION (ICD-401.9) we'll increase her lisinopril to 20 mg once a day. She is meeting with a new primary care physician later this week lab hoping we'll take over her hypertensive care.  Other  Orders: TLB-Hepatic/Liver Function Pnl ([id number])  Patient Instructions: 1)  Increase your lisinopril to 20mg  a day (will call a new script for this). 2)  Continue taking prednisone 20mg  a day. 3)  Return to see Dr. Christella Hartigan in 6 weeks (this will be about one month after your [facility], Dr. Katrinka Blazing appointment). 4)  You will get lab test(s) done today (lfts). 5)  The medication list was reviewed and reconciled.  All changed / newly prescribed medications were explained.  A complete medication list was provided to the patient / caregiver. Prescriptions: LISINOPRIL 20 MG TABS (LISINOPRIL) take one pill a day  #30 x 3   Entered and Authorized by:   Rachael Fee MD   Signed by:   Rachael Fee MD on 06/17/2009   Method used:   Electronically to        [facility] [address] [phone]* (retail)       [address]       [place], [place]  [address]       Ph: [phone] or [phone]       Fax: [phone]   RxID:   [id number]   Appended Document:  faxed to [facility]

## 2010-05-30 NOTE — Consult Note (Signed)
Summary: [name]  [facility] /[facility]   Imported By: Sherian Rein 01/15/2010 09:33:23  _____________________________________________________________________  External Attachment:    Type:   Image     Comment:   External Document

## 2010-06-04 NOTE — Letter (Signed)
Summary: Liver Clinic/[facility]  Liver Clinic/[facility]   Imported By: Sherian Rein 05/28/2010 10:27:21  _____________________________________________________________________  External Attachment:    Type:   Image     Comment:   External Document

## 2010-06-04 NOTE — Assessment & Plan Note (Signed)
Review of gastrointestinal problems: 1. routine risk for colon cancer:  no polyps on colonoscopy 05/2006; next colonoscopy 2018 2. Gerd: normal EGD 05/2006; symptoms well controlled on PPI 3. 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  ANA negative, AMA negative, F actin Antibody  IgG Highly positive ( F actin antibody Reference Range:This ELISA assay is based on purified F-Actin IgGantibodies.  IgG antibodies to F-Actin are presentin approximately: 75% of patients with autoimmune hepatitis type 1, 65% with autoimmune cholangitis, 30% with primary biliary cirrhosis, and 2% of healthy population.) .  ferritin normal, hepatitis A, B, C were all negative. Monospot negative, EBV IgG positive. CT Scan showed no biliary dilation, masses. Essentially normal liver. MRCP/MRI October, 2010: cirrhosis noted, micronodular liver, no CBD stones, mild portal hypertensive changes. November, 2010 and liver biopsy showed "Active cirrhosis" I spoke with the pathologist and he said the findings were consistent with autoimmune disease.  December, 2010 started on prednisone and azathioprine at 50 mg a day. Total bili decreased from 7.4-1.9 in 4 weeks' time.  January 2011 azathioprine, mono therapy, liver tests seemed to increase slightly.  February, 2011: liver tests increased while decreasing prednisone and starting to rely on azathioprine therapy alone. She felt poorly. Azathioprine stopped and prednisone resumed at 20 mg a day. Liver tests improved quickly.  [facility] liver clinic visit April, 2011: Dr. Katrinka Blazing agreed with the diagnosis and recommended that we try CellCept 1 g twice a day and taper steroids.  May, 2011 she started CellCept 1 g twice daily.  October, 2011: liver test elevated again off prednisone, put back on 30 mg per day prednisone, continue CellCept 2 g per day (Dr. Katrinka Blazing).  November 2000 and 11 repeat liver biopsy confirmed active autoimmune  hepatitis.    History of Present Illness Visit Type: Follow-up Visit Primary GI MD: Rob Bunting MD Primary Provider: Lyn Hollingshead, MD Requesting Provider: n/a Chief Complaint: ROV recall c/o change in color of eyes, weight loss History of Present Illness:     pleasant 45 year old woman whom I last saw 3-4 months ago. Since then she has been at [facility] liver clinic 2-3 times with Dr. Katrinka Blazing.  was back at [facility] liver 3 weeks ago.  She told me she doesn't understand her situation.  she was told to stay on prednisone, but she stopped taking the prednisone the day after her office visit at [facility].  She has not taken any prednisone in 3 weeks. She has severe headaches.  She is scheduled to see Dr. Katrinka Blazing again in a few weeks, not sure of the dates.  she feels that her eyes are darker.  More fatigued.             Current Medications (verified): 1)  Lisinopril 40 Mg Tabs (Lisinopril) .... Take 1 Tablet By Mouth Once A Day 2)  Cellcept 250 Mg Caps (Mycophenolate Mofetil) .... Take 4 Capsules in The Am and 4 Capsule in The Pm 3)  Hydrochlorothiazide 25 Mg Tabs (Hydrochlorothiazide) .... Take 1 Tablet By Mouth Once A Day 4)  Omeprazole 40 Mg Cpdr (Omeprazole) .... Take 1 Tablet By Mouth Once A Day 5)  Erythromycin 2 % Gel (Erythromycin) .... Please Apply To Area Where Acne Is Twice Daily.  Allergies (verified): 1)  ! * Latex 2)  ! Aspirin  Vital Signs:  Patient profile:   45 year old female Height:      61.5 inches Weight:      157 pounds BMI:  29.29 Pulse rate:   80 / minute Pulse rhythm:   regular BP sitting:   148 / 108  (left arm)  Vitals Entered By: Milford Cage NCMA (May 27, 2010 4:10 PM)  Physical Exam  Additional Exam:  Constitutional: generally well appearing Psychiatric: alert and oriented times 3 Abdomen: soft, non-tender, non-distended, normal bowel sounds    Impression & Recommendations:  Problem # 1:  autoimmune hepatitis, very poor compliance with  medicines she was told to stay on 30 or 40 mg of prednisone a day 3 weeks ago at [facility] liver clinic however she stop prednisone completely do to headaches. She has not had any prednisone for 3-4 weeks now. She believes her either getting darker. She has lost some weight. She's feeling more fatigued. She got set of liver test just prior to this visit and those are not back yet.  Had a very frank discussion with her about the nature of autoimmune hepatitis and the fact that this disease can spiral out of control very quickly when not treated. I did explain to her that sometimes hepatic failure can occur which is life-threatening.  She understands that she should be taking prednisone but says the headaches prevent her from doing it.  when her liver tests return I will get back in touch with Dr. Katrinka Blazing from [facility] to formulate a plan.  I don't think it is reasonable for [place] to expect her to take prednisone since she clearly will not. Perhaps there are other options.  Patient Instructions: 1)  Await LFTs from today.   2)  Will contact Dr. Katrinka Blazing with those results and let you know what is decided. 3)  The medication list was reviewed and reconciled.  All changed / newly prescribed medications were explained.  A complete medication list was provided to the patient / caregiver.

## 2010-06-05 ENCOUNTER — Telehealth: Payer: Self-pay | Admitting: *Deleted

## 2010-06-05 ENCOUNTER — Other Ambulatory Visit: Payer: Self-pay

## 2010-06-05 DIAGNOSIS — Z1231 Encounter for screening mammogram for malignant neoplasm of breast: Secondary | ICD-10-CM

## 2010-06-05 NOTE — Telephone Encounter (Signed)
Pt called and would like her lab results. Please call her.

## 2010-06-06 NOTE — Telephone Encounter (Signed)
Dr Baltazar Apo away and I am covering her inbasket. It appears that the most recent labs were ordered by Dr Christella Hartigan of GI. I would encourage her to call GI to get feedback on her labs esp since I do not know pt and why the labs were being ordered. [facility] has not seen pt since Oct and since then appears to have had liver bx. If this is not satisfactory, pls let me know and I will investigate further. Thanks

## 2010-06-09 ENCOUNTER — Encounter: Payer: Self-pay | Admitting: Gastroenterology

## 2010-06-12 NOTE — Letter (Signed)
Summary: GI Office Note/[facility]  GI Office Note/[facility]   Imported By: Sherian Rein 06/06/2010 07:24:32  _____________________________________________________________________  External Attachment:    Type:   Image     Comment:   External Document

## 2010-06-12 NOTE — Telephone Encounter (Signed)
Pt has been informed and will call Dr Christella Hartigan

## 2010-06-24 ENCOUNTER — Encounter: Payer: Self-pay | Admitting: Gastroenterology

## 2010-07-02 ENCOUNTER — Other Ambulatory Visit: Payer: Self-pay | Admitting: Family Medicine

## 2010-07-02 ENCOUNTER — Telehealth (INDEPENDENT_AMBULATORY_CARE_PROVIDER_SITE_OTHER): Payer: Self-pay | Admitting: *Deleted

## 2010-07-02 DIAGNOSIS — Z1231 Encounter for screening mammogram for malignant neoplasm of breast: Secondary | ICD-10-CM

## 2010-07-03 ENCOUNTER — Other Ambulatory Visit: Payer: Medicaid Other

## 2010-07-03 NOTE — Letter (Signed)
Summary: Liver Clinic/[facility]  L[facility] Clinic/[facility]   Imported By: Sherian Rein 06/26/2010 11:57:49  _____________________________________________________________________  External Attachment:    Type:   Image     Comment:   External Document

## 2010-07-04 ENCOUNTER — Ambulatory Visit (HOSPITAL_COMMUNITY)
Admission: RE | Admit: 2010-07-04 | Discharge: 2010-07-04 | Disposition: A | Discharge status: Home / Self Care | Payer: Medicaid Other | Source: Ambulatory Visit | Attending: Internal Medicine | Admitting: Internal Medicine

## 2010-07-04 ENCOUNTER — Encounter (INDEPENDENT_AMBULATORY_CARE_PROVIDER_SITE_OTHER): Payer: Self-pay | Admitting: *Deleted

## 2010-07-04 ENCOUNTER — Encounter: Payer: Self-pay | Admitting: Gastroenterology

## 2010-07-04 ENCOUNTER — Other Ambulatory Visit: Payer: Medicaid Other

## 2010-07-04 ENCOUNTER — Other Ambulatory Visit: Payer: Self-pay | Admitting: Gastroenterology

## 2010-07-04 DIAGNOSIS — Z1231 Encounter for screening mammogram for malignant neoplasm of breast: Secondary | ICD-10-CM | POA: Insufficient documentation

## 2010-07-04 DIAGNOSIS — K754 Autoimmune hepatitis: Secondary | ICD-10-CM

## 2010-07-04 LAB — HEPATIC FUNCTION PANEL
ALT: 164 U/L — ABNORMAL HIGH (ref 0–35)
Bilirubin, Direct: 0.5 mg/dL — ABNORMAL HIGH (ref 0.0–0.3)
Total Bilirubin: 1.5 mg/dL — ABNORMAL HIGH (ref 0.3–1.2)

## 2010-07-04 LAB — CONVERTED CEMR LAB: IgG (Immunoglobin G), Serum: 1690 mg/dL — ABNORMAL HIGH (ref 694–1618)

## 2010-07-08 NOTE — Letter (Signed)
Summary: GI Office Note/[facility]  GI Office Note/[facility]   Imported By: Sherian Rein 07/03/2010 10:07:46  _____________________________________________________________________  External Attachment:    Type:   Image     Comment:   External Document

## 2010-07-08 NOTE — Progress Notes (Signed)
Summary: lab order  Phone Note Other Incoming Call back at Home Phone [phone]   Caller: [facility] Summary of Call: Dr Christella Hartigan can we draw a hepatic panel, creatinine, and Immunoglobulin on the pt.  She wants to have them done here? Initial call taken by: Chales Abrahams CMA [name]),  July 02, 2010 2:26 PM  Follow-up for Phone Call        yes Follow-up by: Rachael Fee MD,  July 02, 2010 4:28 PM  Additional Follow-up for Phone Call Additional follow up Details #1::        left message on machine to call back Chales Abrahams CMA [name])  July 03, 2010 9:16 AM   pt aware to come in for the above labs the results will be faxed to [phone] Dr Katrinka Blazing Additional Follow-up by: Chales Abrahams CMA [name]),  July 03, 2010 4:01 PM

## 2010-07-09 LAB — CBC
HCT: 40.2 % (ref 36.0–46.0)
MCH: 34.2 pg — ABNORMAL HIGH (ref 26.0–34.0)
MCV: 99 fL (ref 78.0–100.0)
Platelets: 250 10*3/uL (ref 150–400)
RBC: 4.06 MIL/uL (ref 3.87–5.11)

## 2010-07-10 LAB — COMPREHENSIVE METABOLIC PANEL
Albumin: 3.8 g/dL (ref 3.5–5.2)
Alkaline Phosphatase: 139 U/L — ABNORMAL HIGH (ref 39–117)
BUN: 11 mg/dL (ref 6–23)
Chloride: 104 mEq/L (ref 96–112)
Glucose, Bld: 104 mg/dL — ABNORMAL HIGH (ref 70–99)
Potassium: 3.4 mEq/L — ABNORMAL LOW (ref 3.5–5.1)
Total Bilirubin: 1.4 mg/dL — ABNORMAL HIGH (ref 0.3–1.2)

## 2010-07-10 LAB — CBC
HCT: 38.9 % (ref 36.0–46.0)
Hemoglobin: 13.7 g/dL (ref 12.0–15.0)
MCV: 97.3 fL (ref 78.0–100.0)
RDW: 14.3 % (ref 11.5–15.5)
WBC: 7.1 10*3/uL (ref 4.0–10.5)

## 2010-07-10 LAB — DIFFERENTIAL
Basophils Absolute: 0 10*3/uL (ref 0.0–0.1)
Basophils Relative: 1 % (ref 0–1)
Eosinophils Absolute: 0.2 10*3/uL (ref 0.0–0.7)
Monocytes Absolute: 0.8 10*3/uL (ref 0.1–1.0)
Neutro Abs: 3.7 10*3/uL (ref 1.7–7.7)
Neutrophils Relative %: 52 % (ref 43–77)

## 2010-07-13 LAB — CBC
MCHC: 34 g/dL (ref 30.0–36.0)
Platelets: 203 10*3/uL (ref 150–400)
RBC: 3.54 MIL/uL — ABNORMAL LOW (ref 3.87–5.11)
RDW: 16.3 % — ABNORMAL HIGH (ref 11.5–15.5)

## 2010-07-13 LAB — BASIC METABOLIC PANEL
BUN: 5 mg/dL — ABNORMAL LOW (ref 6–23)
CO2: 27 mEq/L (ref 19–32)
Calcium: 8.6 mg/dL (ref 8.4–10.5)
Creatinine, Ser: 0.72 mg/dL (ref 0.4–1.2)
GFR calc Af Amer: >60 mL/min (ref >60)

## 2010-07-13 LAB — DIFFERENTIAL
Basophils Absolute: 0 10*3/uL (ref 0.0–0.1)
Basophils Relative: 0 % (ref 0–1)
Eosinophils Absolute: 0.1 10*3/uL (ref 0.0–0.7)
Monocytes Relative: 5 % (ref 3–12)
Neutro Abs: 7.3 10*3/uL (ref 1.7–7.7)
Neutrophils Relative %: 87 % — ABNORMAL HIGH (ref 43–77)

## 2010-07-29 LAB — PROTIME-INR
INR: 1.12 (ref 0.00–1.49)
Prothrombin Time: 14.3 seconds (ref 11.6–15.2)

## 2010-07-29 LAB — CBC
Platelets: 196 10*3/uL (ref 150–400)
RBC: 3.46 MIL/uL — ABNORMAL LOW (ref 3.87–5.11)
WBC: 8.1 10*3/uL (ref 4.0–10.5)

## 2010-07-31 ENCOUNTER — Encounter: Payer: Medicaid Other | Admitting: Internal Medicine

## 2010-07-31 LAB — COMPREHENSIVE METABOLIC PANEL
ALT: 345 U/L — ABNORMAL HIGH (ref 0–35)
AST: 536 U/L — ABNORMAL HIGH (ref 0–37)
Albumin: 2.6 g/dL — ABNORMAL LOW (ref 3.5–5.2)
CO2: 24 mEq/L (ref 19–32)
Calcium: 8.3 mg/dL — ABNORMAL LOW (ref 8.4–10.5)
Chloride: 105 mEq/L (ref 96–112)
GFR calc Af Amer: >60 mL/min (ref >60)
GFR calc non Af Amer: >60 mL/min (ref >60)
Sodium: 137 mEq/L (ref 135–145)
Total Bilirubin: 10 mg/dL — ABNORMAL HIGH (ref 0.3–1.2)

## 2010-07-31 LAB — CBC
Platelets: 239 10*3/uL (ref 150–400)
RBC: 3.16 MIL/uL — ABNORMAL LOW (ref 3.87–5.11)
WBC: 7.2 10*3/uL (ref 4.0–10.5)

## 2010-08-01 LAB — COMPREHENSIVE METABOLIC PANEL
ALT: 383 U/L — ABNORMAL HIGH (ref 0–35)
AST: 627 U/L — ABNORMAL HIGH (ref 0–37)
Albumin: 2.7 g/dL — ABNORMAL LOW (ref 3.5–5.2)
Albumin: 3.3 g/dL — ABNORMAL LOW (ref 3.5–5.2)
Alkaline Phosphatase: 277 U/L — ABNORMAL HIGH (ref 39–117)
BUN: 5 mg/dL — ABNORMAL LOW (ref 6–23)
CO2: 23 mEq/L (ref 19–32)
Calcium: 8 mg/dL — ABNORMAL LOW (ref 8.4–10.5)
Chloride: 102 mEq/L (ref 96–112)
Creatinine, Ser: 0.77 mg/dL (ref 0.4–1.2)
Creatinine, Ser: 0.84 mg/dL (ref 0.4–1.2)
GFR calc Af Amer: >60 mL/min (ref >60)
GFR calc non Af Amer: >60 mL/min (ref >60)
GFR calc non Af Amer: >60 mL/min (ref >60)
Glucose, Bld: 84 mg/dL (ref 70–99)
Potassium: 4.1 mEq/L (ref 3.5–5.1)
Sodium: 134 mEq/L — ABNORMAL LOW (ref 135–145)
Total Bilirubin: 10.6 mg/dL — ABNORMAL HIGH (ref 0.3–1.2)

## 2010-08-01 LAB — URINALYSIS, ROUTINE W REFLEX MICROSCOPIC
Hgb urine dipstick: NEGATIVE
Nitrite: NEGATIVE
Protein, ur: NEGATIVE mg/dL
Specific Gravity, Urine: 1.01 (ref 1.005–1.030)
Urobilinogen, UA: 2 mg/dL — ABNORMAL HIGH (ref 0.0–1.0)

## 2010-08-01 LAB — DIFFERENTIAL
Basophils Absolute: 0 10*3/uL (ref 0.0–0.1)
Basophils Relative: 0 % (ref 0–1)
Lymphocytes Relative: 19 % (ref 12–46)
Neutro Abs: 5.3 10*3/uL (ref 1.7–7.7)
Neutrophils Relative %: 62 % (ref 43–77)

## 2010-08-01 LAB — HEPATIC FUNCTION PANEL
ALT: 383 U/L — ABNORMAL HIGH (ref 0–35)
Albumin: 2.7 g/dL — ABNORMAL LOW (ref 3.5–5.2)
Alkaline Phosphatase: 255 U/L — ABNORMAL HIGH (ref 39–117)
Indirect Bilirubin: 3.8 mg/dL — ABNORMAL HIGH (ref 0.3–0.9)
Total Bilirubin: 9.2 mg/dL — ABNORMAL HIGH (ref 0.3–1.2)
Total Protein: 6.5 g/dL (ref 6.0–8.3)

## 2010-08-01 LAB — URINE CULTURE: Culture: NO GROWTH

## 2010-08-01 LAB — APTT: aPTT: 38 seconds — ABNORMAL HIGH (ref 24–37)

## 2010-08-01 LAB — ANTI-SMOOTH MUSCLE ANTIBODY, IGG: F-Actin IgG: 59 U — ABNORMAL HIGH (ref <20)

## 2010-08-01 LAB — HEPATITIS PANEL, ACUTE
HCV Ab: NEGATIVE
Hep B C IgM: NEGATIVE

## 2010-08-01 LAB — VITAMIN B12: Vitamin B-12: >2000 pg/mL — ABNORMAL HIGH (ref 211–911)

## 2010-08-01 LAB — ETHANOL: Alcohol, Ethyl (B): <5 mg/dL (ref 0–10)

## 2010-08-01 LAB — CBC
HCT: 39.3 % (ref 36.0–46.0)
Hemoglobin: 13.6 g/dL (ref 12.0–15.0)
MCV: 104.5 fL — ABNORMAL HIGH (ref 78.0–100.0)
Platelets: 263 10*3/uL (ref 150–400)
WBC: 8.4 10*3/uL (ref 4.0–10.5)

## 2010-08-01 LAB — FERRITIN: Ferritin: 115 ng/mL (ref 10–291)

## 2010-08-01 LAB — SEDIMENTATION RATE: Sed Rate: 9 mm/hr (ref 0–22)

## 2010-08-01 LAB — LIPASE, BLOOD: Lipase: 59 U/L (ref 11–59)

## 2010-08-01 LAB — IGA: IgA: 302 mg/dL (ref 68–378)

## 2010-08-01 LAB — MONONUCLEOSIS SCREEN: Mono Screen: NEGATIVE

## 2010-08-01 LAB — PROTIME-INR: Prothrombin Time: 14.7 seconds (ref 11.6–15.2)

## 2010-08-01 LAB — URINE MICROSCOPIC-ADD ON

## 2010-08-19 ENCOUNTER — Telehealth: Payer: Self-pay

## 2010-08-19 ENCOUNTER — Inpatient Hospital Stay (HOSPITAL_COMMUNITY)
Admission: RE | Admit: 2010-08-19 | Discharge: 2010-08-19 | Disposition: A | Discharge status: Home / Self Care | Payer: Medicaid Other | Source: Ambulatory Visit

## 2010-08-19 ENCOUNTER — Ambulatory Visit (INDEPENDENT_AMBULATORY_CARE_PROVIDER_SITE_OTHER): Payer: Medicaid Other | Admitting: Internal Medicine

## 2010-08-19 ENCOUNTER — Encounter: Payer: Self-pay | Admitting: *Deleted

## 2010-08-19 VITALS — BP 138/83 | HR 85 | Temp 97.1°F | Ht 61.0 in | Wt 173.9 lb

## 2010-08-19 DIAGNOSIS — J309 Allergic rhinitis, unspecified: Secondary | ICD-10-CM

## 2010-08-19 DIAGNOSIS — R05 Cough: Secondary | ICD-10-CM

## 2010-08-19 DIAGNOSIS — R059 Cough, unspecified: Secondary | ICD-10-CM

## 2010-08-19 DIAGNOSIS — F17200 Nicotine dependence, unspecified, uncomplicated: Secondary | ICD-10-CM

## 2010-08-19 DIAGNOSIS — K7460 Unspecified cirrhosis of liver: Secondary | ICD-10-CM

## 2010-08-19 MED ORDER — DEXTROMETHORPHAN-GUAIFENESIN 10-100 MG/5ML PO LIQD: 5.0000 mL | ORAL | Status: AC | PRN

## 2010-08-19 MED ORDER — LORATADINE 10 MG PO TABS: 10.0000 mg | ORAL_TABLET | Freq: Every day | ORAL | Status: DC

## 2010-08-19 NOTE — Progress Notes (Unsigned)
Pt presents being told to come from urgent care to be seen for a "cold, cough" x 2 wks, denies fevers at present, denies productive cough, "all in my head and chest", has not tried anything to alleviate, nothing seems to aggravate. Add to dr Sumner Boast schedule

## 2010-08-19 NOTE — Telephone Encounter (Signed)
Left message on machine to call back labs ordered

## 2010-08-19 NOTE — Assessment & Plan Note (Signed)
Was advised to stop smoking at least until her symptoms become better.

## 2010-08-19 NOTE — Patient Instructions (Signed)
Allergic Rhinitis Allergic rhinitis is when the mucous membranes in the nose respond to allergens. Allergens are particles in the air that cause your body to have an allergic reaction. This causes you to release allergic antibodies. Through a chain of events, these eventually cause you to release histamine into the blood stream (hence the use of antihistamines). Although meant to be protective to the body, it is this release that causes your discomfort, such as frequent sneezing, congestion and an itchy runny nose.  CAUSES The pollen allergens may come from grasses, trees, and weeds. This is seasonal allergic rhinitis, or "hay fever." Other allergens cause year-round allergic rhinitis (perennial allergic rhinitis) such as house dust mite allergen, pet dander and mold spores.  SYMPTOMS  Nasal stuffiness (congestion).   Runny, itchy nose with sneezing and tearing of the eyes.   There is often an itching of the mouth, eyes and ears.  It cannot be cured, but it can be controlled with medications. DIAGNOSIS If you are unable to determine the offending allergen, skin or blood testing may find it. TREATMENT  Avoid the allergen.   Medications and allergy shots (immunotherapy) can help.   Hay fever may often be treated with antihistamines in pill or nasal spray forms. Antihistamines block the effects of histamine. There are over-the-counter medicines that may help with nasal congestion and swelling around the eyes. Check with your caregiver before taking or giving this medicine.  If the treatment above does not work, there are many new medications your caregiver can prescribe. Stronger medications may be used if initial measures are ineffective. Desensitizing injections can be used if medications and avoidance fails. Desensitization is when a patient is given ongoing shots until the body becomes less sensitive to the allergen. Make sure you follow up with your caregiver if problems continue. SEEK  MEDICAL CARE IF:   You develop fever (more than 100.5F (38.1 C).   You develop a cough that does not stop easily (persistent).   You have shortness of breath.   You start wheezing.   Symptoms interfere with normal daily activities.  Document Released: 01/06/2001 Document Re-Released: 05/05/2009 [employer] Patient Information 2011 [employer]. 

## 2010-08-19 NOTE — Telephone Encounter (Signed)
Pt aware to have labs  

## 2010-08-19 NOTE — Progress Notes (Signed)
  Subjective:    Patient ID: Alexandria White, female    DOB: August 08, 1965, 45 y.o.   MRN: [id number]  HPI  Patient is a 45 year old female with past medical history as noted. Patient is here today for an acute visit for cough which has been going on for last 2 weeks. Patient also has upper respiratory tract infection-like symptoms. Patient complains that she's been having some irritation in her eyes and itching around the eyes along with watering. Patient complains that she has been having cough which is associated with clear sputum since lost 2 weeks.  No fever, chills, chest pain, shortness of breath, change in weight, change in appetite, change in bowel movements, change in urinary habits, abdominal pain or change in mood.  Review of Systems  Constitutional: Negative for fever, activity change and appetite change.  HENT: Negative for sore throat.   Eyes: Positive for itching.  Respiratory: Positive for cough and wheezing. Negative for shortness of breath.   Cardiovascular: Negative for chest pain and leg swelling.  Gastrointestinal: Negative for nausea, abdominal pain, diarrhea, constipation and abdominal distention.  Genitourinary: Negative for frequency, hematuria and difficulty urinating.  Neurological: Negative for dizziness and headaches.  Psychiatric/Behavioral: Negative for suicidal ideas and behavioral problems.       Objective:   Physical Exam  Constitutional: She is oriented to person, place, and time. She appears well-developed and well-nourished.  HENT:  Head: Normocephalic and atraumatic.  Eyes: Conjunctivae and EOM are normal. Pupils are equal, round, and reactive to light. No scleral icterus.       Hyperemia of the lids noted.  Neck: Normal range of motion. Neck supple. No JVD present. No thyromegaly present.  Cardiovascular: Normal rate, regular rhythm, normal heart sounds and intact distal pulses.  Exam reveals no gallop and no friction rub.   No murmur  heard. Pulmonary/Chest: Effort normal and breath sounds normal. No respiratory distress. She has no wheezes. She has no rales.  Abdominal: Soft. Bowel sounds are normal. She exhibits no distension and no mass. There is no tenderness. There is no rebound and no guarding.  Musculoskeletal: Normal range of motion. She exhibits no edema and no tenderness.  Lymphadenopathy:    She has no cervical adenopathy.  Neurological: She is alert and oriented to person, place, and time.  Psychiatric: She has a normal mood and affect. Her behavior is normal.          Assessment & Plan:

## 2010-08-26 ENCOUNTER — Other Ambulatory Visit (INDEPENDENT_AMBULATORY_CARE_PROVIDER_SITE_OTHER): Payer: Medicaid Other

## 2010-08-26 DIAGNOSIS — K7460 Unspecified cirrhosis of liver: Secondary | ICD-10-CM

## 2010-08-26 LAB — HEPATIC FUNCTION PANEL
ALT: 109 U/L — ABNORMAL HIGH (ref 0–35)
AST: 112 U/L — ABNORMAL HIGH (ref 0–37)
Albumin: 3.3 g/dL — ABNORMAL LOW (ref 3.5–5.2)
Alkaline Phosphatase: 121 U/L — ABNORMAL HIGH (ref 39–117)
Total Protein: 7.1 g/dL (ref 6.0–8.3)

## 2010-08-27 NOTE — Progress Notes (Signed)
Quick Note:    Please call the patient and send results to Dr. Iva Boop at [facility]. LFTs improving. I will let Dr. Katrinka Blazing adjust her meds as he sees fit.  ______

## 2010-09-03 ENCOUNTER — Other Ambulatory Visit: Payer: Self-pay | Admitting: Gastroenterology

## 2010-09-05 ENCOUNTER — Telehealth: Payer: Self-pay | Admitting: *Deleted

## 2010-09-05 MED ORDER — PREDNISONE 10 MG PO TABS: ORAL_TABLET | ORAL | Status: DC

## 2010-09-05 NOTE — Telephone Encounter (Signed)
Ok to refill per Dr Christella Hartigan

## 2010-09-08 NOTE — Telephone Encounter (Signed)
Graciella Freer approved

## 2010-09-11 ENCOUNTER — Ambulatory Visit (INDEPENDENT_AMBULATORY_CARE_PROVIDER_SITE_OTHER): Payer: Medicaid Other | Admitting: Internal Medicine

## 2010-09-11 ENCOUNTER — Encounter: Payer: Self-pay | Admitting: Internal Medicine

## 2010-09-11 ENCOUNTER — Other Ambulatory Visit (HOSPITAL_COMMUNITY)
Admission: RE | Admit: 2010-09-11 | Discharge: 2010-09-11 | Disposition: A | Discharge status: Home / Self Care | Payer: Medicaid Other | Source: Ambulatory Visit | Attending: Internal Medicine | Admitting: Internal Medicine

## 2010-09-11 VITALS — BP 142/95 | HR 85 | Temp 97.1°F | Ht 61.5 in | Wt 177.1 lb

## 2010-09-11 DIAGNOSIS — Z01419 Encounter for gynecological examination (general) (routine) without abnormal findings: Secondary | ICD-10-CM | POA: Insufficient documentation

## 2010-09-11 DIAGNOSIS — N951 Menopausal and female climacteric states: Secondary | ICD-10-CM

## 2010-09-11 DIAGNOSIS — I10 Essential (primary) hypertension: Secondary | ICD-10-CM

## 2010-09-11 DIAGNOSIS — K7460 Unspecified cirrhosis of liver: Secondary | ICD-10-CM

## 2010-09-11 MED ORDER — CLONIDINE HCL 0.1 MG PO TABS: 0.1000 mg | ORAL_TABLET | Freq: Every day | ORAL | Status: DC

## 2010-09-11 NOTE — Assessment & Plan Note (Signed)
Pt states hot flashes have become worse. Pt is perimenopausal, LMP 08/2009. Will try patient on low dose clonidine 0.1 mg to be taken at bedtime, which may help with hot flashes. Pt's BP was 142 today, so her BP can tolerate. Will have patient return in one month for BP check, as well as follow up with symptoms.

## 2010-09-11 NOTE — Progress Notes (Signed)
  Subjective:    Patient ID: Alexandria White, female    DOB: [birth year], 45 y.o.   MRN: [id number]  HPI 45 year old woman with autoimmune hepatitis followed by Dr. Gerilyn Pilgrim and Dr. Katrinka Blazing presents to the clinic today for general follow up. Patient has hx of noncompliance with prednisone therapy secondary to headaches, and skin discoloration. Patient states since her last appt she has started back on Prednisone 20 mg once daily, along with cellcept.  She states she is still having headaches, but are mild in nature. Patient is still complaining of hot flashes, and states they have been getting worse. Last menstrual period was April 2011.   No other complaints or concerns today. Patient denies chest pain, cough, sob, abdominal pain, nausea, vomiting, changes in bowel habits or urinary habits.    Review of Systems  All other systems reviewed and are negative.       Objective:   Physical Exam  Constitutional: She appears well-developed.  HENT:  Head: Normocephalic.  Eyes: Pupils are equal, round, and reactive to light.  Neck: Normal range of motion. Neck supple.  Cardiovascular: Normal rate and regular rhythm.   Pulmonary/Chest: Effort normal and breath sounds normal.  Abdominal: Soft. Bowel sounds are normal.  Genitourinary: Vagina normal and uterus normal. No vaginal discharge found.  Musculoskeletal: Normal range of motion.  Neurological:       Grossly normal  Psychiatric: She has a normal mood and affect.          Assessment & Plan:

## 2010-09-11 NOTE — Patient Instructions (Addendum)
Please take Clonidine 0.1 mg at bedtime for hot flashes.  Please follow up with Dr. Baltazar Apo in one month.  Please continue to take Prednisone and Cellcept as directed.  Please continue to take all other medications as prescribed.

## 2010-09-11 NOTE — Assessment & Plan Note (Signed)
Followed by Dr. Gerilyn Pilgrim and Dr. Katrinka Blazing. Pt had CT abd done at [facility] a couple of months ago. No new findings, per patient. Will need to request report from [facility]. Per GI, will continue cellcept and prednisone.

## 2010-09-11 NOTE — Assessment & Plan Note (Addendum)
Lab Results  Component Value Date   NA 139 05/29/2010   K 3.7 05/29/2010   CL 103 05/29/2010   CO2 23 05/29/2010   BUN 10 05/29/2010   CREATININE 0.9 07/04/2010   CREATININE 0.91 05/29/2010    BP Readings from Last 3 Encounters:  09/11/10 142/95  08/19/10 138/83  05/29/10 143/83    Assessment: Hypertension control:  controlled  Progress toward goals:  at goal Barriers to meeting goals:  no barriers identified  Plan: Hypertension treatment:  continue current medications Will add low dose of clonidine 0.1 mg to be taken at bedtime, which may help with hot flashes and see patient back in 1 month.   Renal function and electrolytes last checked 05/2010 and were wnl.

## 2010-09-12 NOTE — Assessment & Plan Note (Signed)
[facility]                         GASTROENTEROLOGY OFFICE NOTE   NAME:Alexandria White, Alexandria White                   MRN:          [id number]  DATE:05/10/2006                            DOB:          1966-04-19    REASON FOR REFERRAL:  Dr. Lorel Monaco asked me to evaluate Ms. Gildersleeve in  consultation regarding hematochezia and GERD symptoms.   HISTORY OF PRESENT ILLNESS:  Ms. Ryant is a very pleasant 45 year old  woman who has two likely unrelated GI complaints.  First, she has had  three episodes of bright red blood per rectum.  It sounds like it was  relatively minor bleeding.  None occurred around the time of  constipation, although she was constipated approximately 6 weeks ago for  about a week.  That constipation is very unusual for her.  The bleeding  was a little bit of red in the toilet water but none between bowel  movements.  This happened three times and none since then.   She also has once- to twice-a-week pyrosis and acid taste in her mouth.  She says certain foods such as greasy foods definitely cause it to  happen.  She does have mild dysphagia occasionally to solids only.  She  has been on Nexium in the past.  More recently she is on twice-a-day  Protonix by her primary care physician and she says on that regimen she  has zero reflux symptoms.   REVIEW OF SYSTEMS:  Notable for approximately 25-pound weight loss in  the past several months.  This is unintentional.  The rest of her review  of systems is essentially normal and is available on her nursing intake  sheet.   PAST MEDICAL HISTORY:  1. Hypertension.  2. Chronic headaches.  3. Status post cholecystectomy in 1992.  4. Tubal ligation.  5. 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   CURRENT MEDICINES:  1. Protonix 40 mg twice daily.  She takes these 20-30 minutes after      she eats her breakfast meal and her dinner meal.  2. Meloxicam (recently prescribed for some hip  pain but she has only      taken one or two pills).  3. Elavil 25 mg q.h.s.   ALLERGIES:  No known drug allergies.   SOCIAL HISTORY:  Divorced with six children.  Works in Engineering geologist.  Smokes a  half pack of cigarettes a day.  Nondrinker.   FAMILY HISTORY:  No colon cancer or colon polyps in the family.   PHYSICAL EXAMINATION:  VITAL SIGNS:  Height 5 feet 1.5 inches, 166  pounds.  Blood pressure 132/86, pulse 80.  CONSTITUTIONAL:  Generally well-appearing.  NEUROLOGIC:  Alert and oriented x3.  EYES:  Extraocular movements intact.  MOUTH:  Oropharynx moist, no lesions.  NECK:  Supple, no lymphadenopathy.  CARDIOVASCULAR:  Heart regular rate and rhythm.  LUNGS:  Clear to auscultation bilaterally.  ABDOMEN:  Soft, nontender, nondistended, normal bowel sounds.  EXTREMITIES:  No lower extremity edema.  SKIN:  No rashes or lesions on visible extremities.   ASSESSMENT AND PLAN:  A 45 year old woman with recent hematochezia,  chronic gastroesophageal reflux disease.   She does not appear to be anemic but I will arrange for her to have a  CBC and a complete metabolic profile just to be safe.  Her GI bleeding  seems very minor but should be evaluated by full colonoscopy.  Her upper  GI symptoms of pyrosis are very well helped with twice-a-day Protonix.  She is, however, taking the Protonix at the incorrect time in relation  to food and so I have recommended she go down to one pill a day but take  it 20-30 minutes prior to her dinner meal.  Usually I recommend  breakfast meal but since she does not eat breakfast regularly I think  the dinner meal will do just fine.  She does have intermittent dysphagia  and alarm symptom of weight loss and so we should proceed with her EGD  at the same time as her colonoscopy.  In the meantime, she will stay on  the Protonix as above.     Rachael Fee, MD  Electronically Signed    DPJ/MedQ  DD: 05/10/2006  DT: 05/10/2006  Job #: [id number]   cc:    Dellia Beckwith, M.D.

## 2010-09-29 ENCOUNTER — Telehealth: Payer: Self-pay | Admitting: Gastroenterology

## 2010-09-29 NOTE — Telephone Encounter (Signed)
Pt has had some nausea and would like to see Dr Christella Hartigan.  Appt made for 11/17/10

## 2010-10-07 ENCOUNTER — Other Ambulatory Visit: Payer: Self-pay | Admitting: Internal Medicine

## 2010-10-17 ENCOUNTER — Inpatient Hospital Stay (INDEPENDENT_AMBULATORY_CARE_PROVIDER_SITE_OTHER)
Admission: RE | Admit: 2010-10-17 | Discharge: 2010-10-17 | Disposition: A | Discharge status: Home / Self Care | Payer: Medicaid Other | Source: Ambulatory Visit | Attending: Emergency Medicine | Admitting: Emergency Medicine

## 2010-10-17 DIAGNOSIS — L0211 Cutaneous abscess of neck: Secondary | ICD-10-CM

## 2010-10-20 ENCOUNTER — Telehealth: Payer: Self-pay | Admitting: *Deleted

## 2010-10-20 NOTE — Telephone Encounter (Signed)
Pt calls states she needs urg care f/u appt, states she finishes abx fri 6/29, will ask for appt mon or tues 7/2 or 7/3, transferred to doris s.

## 2010-10-31 ENCOUNTER — Encounter: Payer: Medicaid Other | Admitting: Internal Medicine

## 2010-11-05 ENCOUNTER — Encounter: Payer: Self-pay | Admitting: Internal Medicine

## 2010-11-05 ENCOUNTER — Ambulatory Visit (INDEPENDENT_AMBULATORY_CARE_PROVIDER_SITE_OTHER): Payer: Medicaid Other | Admitting: Internal Medicine

## 2010-11-05 VITALS — BP 149/102 | HR 83 | Temp 97.1°F | Ht 61.0 in | Wt 184.3 lb

## 2010-11-05 DIAGNOSIS — E063 Autoimmune thyroiditis: Secondary | ICD-10-CM

## 2010-11-05 DIAGNOSIS — D539 Nutritional anemia, unspecified: Secondary | ICD-10-CM

## 2010-11-05 DIAGNOSIS — K219 Gastro-esophageal reflux disease without esophagitis: Secondary | ICD-10-CM

## 2010-11-05 DIAGNOSIS — Z0000 Encounter for general adult medical examination without abnormal findings: Secondary | ICD-10-CM | POA: Insufficient documentation

## 2010-11-05 DIAGNOSIS — I10 Essential (primary) hypertension: Secondary | ICD-10-CM

## 2010-11-05 DIAGNOSIS — L0212 Furuncle of neck: Secondary | ICD-10-CM | POA: Insufficient documentation

## 2010-11-05 MED ORDER — OMEPRAZOLE 40 MG PO CPDR: 40.0000 mg | DELAYED_RELEASE_CAPSULE | Freq: Two times a day (BID) | ORAL | Status: DC

## 2010-11-05 NOTE — Assessment & Plan Note (Signed)
Currently asymptomatic, will re-check TSH/T4 at this visit.

## 2010-11-05 NOTE — Assessment & Plan Note (Signed)
BP elevated today to 149/102 (previous visit = 142/95), likely due to patient not taking lisinopril for the last 5 days -we discussed at length the importance of blood pressure control, and the importance of taking her medications daily -patient notes that cost is a large barrier to taking her medications, but we discussed ways that her family members could help her afford her medications. -re-check BP in 1 month after resuming medication regimen of HCTZ 25mg , Lisinopril 40mg 

## 2010-11-05 NOTE — Assessment & Plan Note (Addendum)
Symptoms likely due to GERD, given epigastric -> diffuse abdominal pain, worse in a.m., worse after big meals, plus improper use of omeprazole medication and the use prednisone on an empty stomach -we discussed at length the proper way to take omeprazole, and the importance of taking her other medications with food -will increase omeprazole 40mg  from qday to BID -follow-up with GI at appt on 7/23

## 2010-11-05 NOTE — Progress Notes (Signed)
ED Follow-up, Intern Note  HPI: The patient is a 45 yo woman with a history of HTN, autoimmune hepatitis, hypothyroidism, and anemia, presenting for an ED follow-up for a furuncle, and a new complaint of abdominal pain.  The patient was seen in the ED on 10/17/10 for a furuncle on the back of her neck.  The furuncle was lanced and drained, and the patient was prescribed a 7-day course of doxycycline.  She has completed this course of medication, and notes that the affected area has greatly decreased in size, though it is still present as a non-painful, non-draining firm nodule.  She also notes a 4-5 month history of abdominal pain, starting as a sharp, twisting pain in her epigastrum, but now also involving her abdomen diffusely.  The pain is worst in the morning, especially after eating a large meal or eating late at night the night before, and is accompanied by daily nausea, a sour taste in the back of her mouth, and once/week vomiting.  The symptoms typically resolve by late morning, but can recur throughout the day, and are relieved by drinking milk.  She also notes 1 night of waking up from sleep coughing.  She notes no heartburn currently, but states that she had severe heartburn before starting omeprazole 5 months ago.  She avoids spicy or acidic foods, but drinks 1-2 caffeinated beverages/day.  She also notes that she takes her omeprazole with all of her other medications, immediately after awakening, with a glass of milk, and does not eat any other food for 1 or more hours thereafter.  No fatigue, lightheadedness, diarrhea/constipation, or blood in her stool, though she notes a history of internal hemorrhoids.  ROS: General: no fevers, chills, changes in weight, changes in appetite Skin: no rash HEENT: no blurry vision, hearing changes, sore throat Pulm: +1 episode nocturnal coughing, no dyspnea, wheezing CV: no chest pain, palpitations, shortness of breath Abd: see HPI Ext: no arthralgias,  myalgias Neuro: no weakness, numbness, or tingling  Filed Vitals:   11/05/10 0905  BP: 149/102  Pulse: 83  Temp: 97.1 F (36.2 C)    PEX General: alert, cooperative, and in no apparent distress HEENT: pupils equal round and reactive to light, vision grossly intact, oropharynx clear and non-erythematous  Neck: supple, no lymphadenopathy Lungs: clear to ascultation bilaterally, normal work of respiration, no wheezes, rales, ronchi Heart: regular rate and rhythm, no murmurs, gallops, or rubs Abdomen: soft, maximally tender to palpation of epigastric region, also mildly ttp diffusely throughout abdomen, no rebound tenderness or guarding, non-distended, normal bowel sounds Msk: no joint edema, warmth, or erythema Extremities: no cyanosis, clubbing, or edema Neurologic: alert & oriented X3, cranial nerves II-XII intact, strength grossly intact, sensation intact to light touch  Assessment/Plan:

## 2010-11-05 NOTE — Assessment & Plan Note (Signed)
Patient notes furuncle on neck at ED visit 10/17/10, presently resolved with a small area of persistent inflammation and discoloration -furuncle resolved -patient completed 7-day course of doxycycline -inflammation will slowly decrease over time

## 2010-11-05 NOTE — Patient Instructions (Addendum)
Stomach pain:  Your stomach pain is likely caused by acid reflux.  To prevent stomach pain, take your Omeprazole medication BY ITSELF and WITH WATER (not milk) in the morning when you wake up.  Wait 30 minutes, then eat some food and take the rest of your medications 30-60 minutes after you took the Omeprazole pill.  Taking the medication in this way will help it be as effective as possible.   -Also, we have added a second dose of omeprazole for you at night.  Take this pill with water, 30-60 minutes before eating dinner.   -Let Dr. Christella Hartigan know how your stomach is feeling at your appointment on 11/17/10  High blood pressure:  Your blood pressure was higher than normal today, which was probably caused by missing your lisinopril dose for the last 5 days.  It is very important to keep your blood pressure under control to prevent damage to your heart, kidneys, and even your brain.  Please continue to take your lisinopril medication, and we will re-check your blood pressure in 1 month.  Neck bump:  The "furuncle", or "boil", on your neck appears to be fully healed.  Allow a few more weeks for the inflammation around the area to disappear.  Please return for a follow-up visit in 1 month for a blood pressure check.  Acid Reflux (GERD) Acid reflux is also called gastroesophageal reflux disease (GERD). Your stomach makes acid to help digest food. Acid reflux happens when acid from your stomach goes into the tube between your mouth and stomach (esophagus). Your stomach is protected from the acid, but this tube is not. When acid gets into the tube, it may cause a burning feeling in the chest (heartburn). Besides heartburn, other health problems can happen if the acid keeps going into the tube. Some causes of acid reflux include:  Being overweight.   Smoking.   Drinking alcohol.   Eating large meals.   Eating meals and then going to bed right away.   Eating certain foods.   Increased stomach acid  production.  HOME CARE  Take all medicine as told by your doctor.   You may need to:   Lose weight.   Avoid alcohol.   Quit smoking.   Do not eat big meals. It is better to eat smaller meals throughout the day.   Do not eat a meal and then nap or go to bed.   Sleep with your head higher than your stomach.   Avoid foods that bother you.   You may need more tests, or you may need to see a special doctor.  GET HELP RIGHT AWAY IF:  You have chest pain that is different than before.   You have pain that goes to your arms, jaw, or between your shoulder blades.   You throw up (vomit) blood, dark Dewell Monnier liquid, or your throw up looks like coffee grounds.   You have trouble swallowing.   You have trouble breathing or cannot stop coughing.   You feel dizzy or pass out.   Your skin is cool, wet, and pale.   Your medicine is not helping.  MAKE SURE YOU:   Understand these instructions.   Will watch your condition.   Will get help right away if you are not doing well or get worse.  Document Released: 09/30/2007 Document Re-Released: 07/08/2009 [facility] Patient Information 2011 [place], [place].

## 2010-11-05 NOTE — Assessment & Plan Note (Signed)
-  patient reports tdap 5-6 yrs ago, declines vaccine at this time

## 2010-11-06 LAB — BASIC METABOLIC PANEL WITH GFR
CO2: 33 mEq/L — ABNORMAL HIGH (ref 19–32)
Calcium: 10 mg/dL (ref 8.4–10.5)
Creat: 0.99 mg/dL (ref 0.50–1.10)
GFR, Est African American: >60 mL/min (ref >60)
Sodium: 143 mEq/L (ref 135–145)

## 2010-11-06 LAB — CBC
MCH: 35.3 pg — ABNORMAL HIGH (ref 26.0–34.0)
MCHC: 33.9 g/dL (ref 30.0–36.0)
MCV: 104.2 fL — ABNORMAL HIGH (ref 78.0–100.0)
Platelets: 290 10*3/uL (ref 150–400)
RBC: 4.02 MIL/uL (ref 3.87–5.11)

## 2010-11-06 LAB — TSH: TSH: 0.409 u[IU]/mL (ref 0.350–4.500)

## 2010-11-07 ENCOUNTER — Other Ambulatory Visit: Payer: Self-pay | Admitting: Internal Medicine

## 2010-11-07 DIAGNOSIS — D7589 Other specified diseases of blood and blood-forming organs: Secondary | ICD-10-CM

## 2010-11-07 DIAGNOSIS — D72829 Elevated white blood cell count, unspecified: Secondary | ICD-10-CM

## 2010-11-10 ENCOUNTER — Other Ambulatory Visit: Payer: Medicaid Other

## 2010-11-10 DIAGNOSIS — D72829 Elevated white blood cell count, unspecified: Secondary | ICD-10-CM

## 2010-11-10 DIAGNOSIS — D7589 Other specified diseases of blood and blood-forming organs: Secondary | ICD-10-CM

## 2010-11-10 LAB — CBC WITH DIFFERENTIAL/PLATELET
Basophils Relative: 0 % (ref 0–1)
Eosinophils Absolute: 0.1 10*3/uL (ref 0.0–0.7)
Eosinophils Relative: 1 % (ref 0–5)
Hemoglobin: 13 g/dL (ref 12.0–15.0)
MCH: 34.3 pg — ABNORMAL HIGH (ref 26.0–34.0)
MCHC: 32.9 g/dL (ref 30.0–36.0)
Monocytes Relative: 9 % (ref 3–12)
Neutrophils Relative %: 65 % (ref 43–77)

## 2010-11-10 LAB — COMPREHENSIVE METABOLIC PANEL
Alkaline Phosphatase: 94 U/L (ref 39–117)
BUN: 14 mg/dL (ref 6–23)
Creat: 0.94 mg/dL (ref 0.50–1.10)
Glucose, Bld: 91 mg/dL (ref 70–99)
Sodium: 143 mEq/L (ref 135–145)
Total Bilirubin: 0.7 mg/dL (ref 0.3–1.2)
Total Protein: 6.9 g/dL (ref 6.0–8.3)

## 2010-11-10 LAB — URINALYSIS, ROUTINE W REFLEX MICROSCOPIC
Bilirubin Urine: NEGATIVE
Glucose, UA: NEGATIVE mg/dL
Leukocytes, UA: NEGATIVE
Specific Gravity, Urine: 1.03 (ref 1.005–1.030)
pH: 6 (ref 5.0–8.0)

## 2010-11-10 LAB — VITAMIN B12: Vitamin B-12: 866 pg/mL (ref 211–911)

## 2010-11-11 LAB — URINE CULTURE
Colony Count: NO GROWTH
Organism ID, Bacteria: NO GROWTH

## 2010-11-11 LAB — PATHOLOGIST SMEAR REVIEW

## 2010-11-12 ENCOUNTER — Telehealth: Payer: Self-pay | Admitting: Internal Medicine

## 2010-11-12 ENCOUNTER — Telehealth: Payer: Self-pay | Admitting: Gastroenterology

## 2010-11-12 NOTE — Telephone Encounter (Signed)
Pt has appt on Monday she is concerned because her internal hemorrhoids are bleeding and wonders what to do until then.  I advised her to use Prep H and drink plenty of fluids and keep her bowels regular.  She will call back if the bleeding worsens or she feels she can not wait until Monday for her appt.

## 2010-11-12 NOTE — Telephone Encounter (Signed)
The patient was called regarding her lab results.  Labs drawn 11/05/10 showed an elevated WBC count and macrocytosis (chronic).  Follow-up labs 11/10/10 showed a return to normal in the patient's WBC count, a negative UA, and a normal cmp, as well as a normal vitamin B12 level.  The patient was reassured that everything was normal, and no further work-up was needed.

## 2010-11-17 ENCOUNTER — Ambulatory Visit (INDEPENDENT_AMBULATORY_CARE_PROVIDER_SITE_OTHER): Payer: Medicaid Other | Admitting: Gastroenterology

## 2010-11-17 ENCOUNTER — Encounter: Payer: Self-pay | Admitting: Gastroenterology

## 2010-11-17 VITALS — BP 134/90 | HR 88 | Ht 61.5 in | Wt 182.8 lb

## 2010-11-17 DIAGNOSIS — K625 Hemorrhage of anus and rectum: Secondary | ICD-10-CM

## 2010-11-17 DIAGNOSIS — K754 Autoimmune hepatitis: Secondary | ICD-10-CM

## 2010-11-17 DIAGNOSIS — R1013 Epigastric pain: Secondary | ICD-10-CM

## 2010-11-17 DIAGNOSIS — R110 Nausea: Secondary | ICD-10-CM

## 2010-11-17 NOTE — Progress Notes (Signed)
Review of gastrointestinal problems:  1. routine risk for colon cancer: no polyps on colonoscopy 05/2006; next colonoscopy 2018  2. Gerd: normal EGD 05/2006; symptoms well controlled on PPI  3. 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 ANA negative, AMA negative, F actin Antibody IgG Highly positive ( F actin antibody Reference Range:This ELISA assay is based on purified F-Actin IgGantibodies. IgG antibodies to F-Actin are presentin approximately: 75% of patients with autoimmune hepatitis type 1, 65% with autoimmune cholangitis, 30% with primary biliary cirrhosis, and 2% of healthy population.) . ferritin normal, hepatitis A, B, C were all negative. Monospot negative, EBV IgG positive. CT Scan showed no biliary dilation, masses. Essentially normal liver. MRCP/MRI October, 2010: cirrhosis noted, micronodular liver, no CBD stones, mild portal hypertensive changes. November, 2010 and liver biopsy showed "Active cirrhosis" I spoke with the pathologist and he said the findings were consistent with autoimmune disease. December, 2010 started on prednisone and azathioprine at 50 mg a day. Total bili decreased from 7.4-1.9 in 4 weeks' time. January 2011 azathioprine, mono therapy, liver tests seemed to increase slightly. February, 2011: liver tests increased while decreasing prednisone and starting to rely on azathioprine therapy alone. She felt poorly. Azathioprine stopped and prednisone resumed at 20 mg a day. Liver tests improved quickly. [facility] liver clinic visit April, 2011: Dr. Katrinka Blazing agreed with the diagnosis and recommended that we try CellCept 1 g twice a day and taper steroids. May, 2011 she started CellCept 1 g twice daily. October, 2011: liver test elevated again off prednisone, put back on 30 mg per day prednisone, continue CellCept 2 g per day (Dr. Katrinka Blazing). November 2000 and 11 repeat liver biopsy confirmed active autoimmune hepatitis.  January 2012 poorly  compliant to [facility] recommendations as well as my own total bilirubin 2.5, AST 900, ALT 600.  HPI: This is a very pleasant 45 year old woman whom I last saw about 6 months ago.  Blood work from last week shows AST and ALT in the 50-60 range. Other liver tests all completely normal. I reviewed a note from her [facility] hepatologist dated July 12. He was responding to some labs that were drawn about 2 months prior. At that time, May 2012 her AST was 112, her ALT was 109.  She is planning to see Dr. Katrinka Blazing in early August.    Has been on prednisone 20mg  a day since April.  Has been on cell cept 1gram bid since April.  Liver numbers look great (see above).    Has had epigastric discomfort, rumbling.  Worse in the morning.  She takes NO nsaids.  Doubling her PPI two weeks ago has not made much of a difference.    Has intermittent rectal bleeding, bright red.  No constipation.  Had darker looking stools recently.   Doesn't think she has hemorrhoids.   Past Medical History:   Cystic acne                                                  Fatigue  Thumb pain                                                   Cirrhosis                                                      Comment:Autoimmune, elevated IgG, followed by Dr.               Christella Hartigan and Dr. Cleda Clarks ([facility])   Hashimoto's thyroiditis                                        Comment:hx of    Perimenopausal                                               History of macrocytic anemia                                 Tobacco abuse                                                HTN (hypertension)                                           GERD (gastroesophageal reflux disease)                       Headache                                                       Comment:tension with features of migraine   At risk for colon cancer                                       Comment:2008 colonoscopy negative, next  due 2018  Past Surgical History:   CHOLECYSTECTOMY                                 1992           Comment:laparoscopically    ERCP  reports that she has been smoking Cigarettes.  She has smoked for the past 3 years. She has never used smokeless tobacco. She reports that she does not drink alcohol or use illicit drugs.  family history includes Cancer in her brother; Diabetes in her mother; and Migraines in her mother.    Current medicines and allergies were reviewed in [facility]    Physical Exam: BP 134/90  Pulse 88  Ht 5' 1.5" (1.562 m)  Wt 182 lb 12.8 oz (82.918 kg)  BMI 33.98 kg/m2 Constitutional: generally well-appearing Psychiatric: alert and oriented x3 Abdomen: soft, nontender, nondistended, no obvious ascites, no peritoneal signs, normal bowel sounds Rectal examination with female assistant in room: small to medium, non-thrombosed external hems, no rectal masses, stool brown.    Assessment and plan: 45 y.o. female with AIH, epigastric abd pains, intermittent rectal bleeding (dark lately).  LFTs look great last week, not normal but certainly the best I've seen for her in a very long time.  She is really getting cushionoid appearing, gaining weight. Will have her cut back on prednsone to 15mg  a day (from 20mg  a day) until she sees Dr. Katrinka Blazing at [facility]. She will continue cellcept at 1grm bid.  Will proceed with EGD tomorrow for her epig pains, (gastritis, PUD, h. Pylori?).  She will use otc ointment for hemorrhoids as needed.

## 2010-11-17 NOTE — Patient Instructions (Addendum)
Cut back on prednisone to 15mg  a day until you see Dr. Katrinka Blazing at [facility] next month.  Continue the cell-cept at current dose. You will be set up for an upper endoscopy tomorrow. Use Prep H hemorrhoidal cream as needed. A copy of this information will be made available to Dr. Katrinka Blazing at [facility].

## 2010-11-18 ENCOUNTER — Ambulatory Visit (AMBULATORY_SURGERY_CENTER): Payer: Medicaid Other | Admitting: Gastroenterology

## 2010-11-18 ENCOUNTER — Encounter: Payer: Self-pay | Admitting: Gastroenterology

## 2010-11-18 DIAGNOSIS — K2940 Chronic atrophic gastritis without bleeding: Secondary | ICD-10-CM

## 2010-11-18 DIAGNOSIS — K2970 Gastritis, unspecified, without bleeding: Secondary | ICD-10-CM

## 2010-11-18 DIAGNOSIS — R110 Nausea: Secondary | ICD-10-CM

## 2010-11-18 DIAGNOSIS — R1013 Epigastric pain: Secondary | ICD-10-CM

## 2010-11-18 DIAGNOSIS — K2990 Gastroduodenitis, unspecified, without bleeding: Secondary | ICD-10-CM

## 2010-11-18 MED ORDER — SODIUM CHLORIDE 0.9 % IV SOLN: 500.0000 mL | INTRAVENOUS | Status: DC

## 2010-11-18 NOTE — Patient Instructions (Signed)
Please review discharge instructions.

## 2010-11-19 ENCOUNTER — Telehealth: Payer: Self-pay | Admitting: *Deleted

## 2010-11-19 NOTE — Telephone Encounter (Signed)
No answer, no identifier on message machine, no message left for patient.

## 2010-12-17 ENCOUNTER — Emergency Department (HOSPITAL_COMMUNITY)
Admission: EM | Admit: 2010-12-17 | Discharge: 2010-12-17 | Disposition: A | Discharge status: Home / Self Care | Payer: Medicaid Other | Attending: Emergency Medicine | Admitting: Emergency Medicine

## 2010-12-17 ENCOUNTER — Ambulatory Visit (INDEPENDENT_AMBULATORY_CARE_PROVIDER_SITE_OTHER): Payer: Medicaid Other | Admitting: Internal Medicine

## 2010-12-17 ENCOUNTER — Emergency Department (HOSPITAL_COMMUNITY): Payer: Medicaid Other

## 2010-12-17 ENCOUNTER — Ambulatory Visit: Payer: Self-pay | Admitting: Licensed Clinical Social Worker

## 2010-12-17 ENCOUNTER — Encounter: Payer: Medicaid Other | Admitting: Internal Medicine

## 2010-12-17 ENCOUNTER — Encounter: Payer: Self-pay | Admitting: Internal Medicine

## 2010-12-17 VITALS — BP 135/85 | HR 77 | Temp 97.2°F | Ht 61.5 in | Wt 189.6 lb

## 2010-12-17 DIAGNOSIS — R1110 Vomiting, unspecified: Secondary | ICD-10-CM | POA: Insufficient documentation

## 2010-12-17 DIAGNOSIS — K7460 Unspecified cirrhosis of liver: Secondary | ICD-10-CM

## 2010-12-17 DIAGNOSIS — I10 Essential (primary) hypertension: Secondary | ICD-10-CM | POA: Insufficient documentation

## 2010-12-17 DIAGNOSIS — R51 Headache: Secondary | ICD-10-CM | POA: Insufficient documentation

## 2010-12-17 DIAGNOSIS — R1011 Right upper quadrant pain: Secondary | ICD-10-CM | POA: Insufficient documentation

## 2010-12-17 DIAGNOSIS — K219 Gastro-esophageal reflux disease without esophagitis: Secondary | ICD-10-CM | POA: Insufficient documentation

## 2010-12-17 DIAGNOSIS — Z79899 Other long term (current) drug therapy: Secondary | ICD-10-CM | POA: Insufficient documentation

## 2010-12-17 DIAGNOSIS — X838XXA Intentional self-harm by other specified means, initial encounter: Secondary | ICD-10-CM

## 2010-12-17 LAB — COMPREHENSIVE METABOLIC PANEL
ALT: 66 U/L — ABNORMAL HIGH (ref 0–35)
BUN: 12 mg/dL (ref 6–23)
CO2: 27 mEq/L (ref 19–32)
Calcium: 9.3 mg/dL (ref 8.4–10.5)
Creatinine, Ser: 0.69 mg/dL (ref 0.50–1.10)
GFR calc Af Amer: >60 mL/min (ref >60)
GFR calc non Af Amer: >60 mL/min (ref >60)
Glucose, Bld: 82 mg/dL (ref 70–99)
Total Protein: 7.3 g/dL (ref 6.0–8.3)

## 2010-12-17 LAB — CBC
Hemoglobin: 13.7 g/dL (ref 12.0–15.0)
MCV: 98.7 fL (ref 78.0–100.0)
Platelets: 262 10*3/uL (ref 150–400)
RBC: 3.9 MIL/uL (ref 3.87–5.11)
WBC: 8.6 10*3/uL (ref 4.0–10.5)

## 2010-12-17 LAB — URINALYSIS, ROUTINE W REFLEX MICROSCOPIC
Bilirubin Urine: NEGATIVE
Ketones, ur: 15 mg/dL — AB
Nitrite: NEGATIVE
Protein, ur: NEGATIVE mg/dL
pH: 6 (ref 5.0–8.0)

## 2010-12-17 LAB — DIFFERENTIAL
Basophils Relative: 1 % (ref 0–1)
Eosinophils Absolute: 0.1 10*3/uL (ref 0.0–0.7)
Lymphs Abs: 3.2 10*3/uL (ref 0.7–4.0)
Neutro Abs: 4.1 10*3/uL (ref 1.7–7.7)
Neutrophils Relative %: 48 % (ref 43–77)

## 2010-12-17 LAB — LIPASE, BLOOD: Lipase: 40 U/L (ref 11–59)

## 2010-12-17 LAB — URINE MICROSCOPIC-ADD ON

## 2010-12-17 LAB — POCT PREGNANCY, URINE: Preg Test, Ur: NEGATIVE

## 2010-12-17 NOTE — Progress Notes (Signed)
45 minutes. CSW.  Referred by Dr. Aldine Contes for assessment, support, and planning regarding 45 year old AA woman with multiple medical issues including cirrhosis of liver (autoimmune), abdominal pain, headaches, and depression.  The patient is doubled over in pain due to abdominal discomfort and headache.  She also reported a suicide attempt one week ago ingesting a variety of pills from her purse which then regurgitated up.  The patient reports that she has never tried to hurt herself and does not have a history of any mental health care or admissions.  She denies any history of alcohol or substance abuse.  A review of E-chart confirms that she has not had any [facility] admissions.  The patient stated that she still has a 34 yo daughter and that is one reason she would like to continue living.   Met with patient in conference room and who tells me that her life has been spiraling downward since her diagnosis of cirrhosis in 2010.  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  She was employed at [employer] as a Occupational psychologist and lost that job due to health issues and missed days.   In March she lost her condo due to financial issues.  Patient is living with her 48 year old son and said that her children are supportive however live a wild life.   She actually has 6  children ( four girls ages 39, 39, 42, and 1 and two boys ages 25 and 32).  Patient takes 9 meds in the AM and 6 meds in the PM and she is distraught about having to take this many meds.   Church:  Her church is in [place] and she has not been able to attend there.   Transit:  The patient uses the bus system to get around town.   Enployment:  The patient said she just got a job at [employer] but doesn't know if she will be able to keep that job. Patient disclosed that she has wanted to become an LPN but due to finances could not study at [employer].    Disability:  She has an attorney exploring  whether she would be candidate for Disability however she said she would really like to work.   Mood and affect are incongruent with her reports of sadness, depression, and   A/P: Patient with very recent hx of suicide attempt, in financial crisis, and battling cirrhosis of liver.   Discussed case with PCP and attending, Dr. Phillips Odor.  Decision was made to refer to the ED for further evaluation and bloodwork in light of liver disease,  headaches and abdominal pain as well as depression and suicide attempt.   Nurse Deb Ditzler walked patient over to ED.

## 2010-12-17 NOTE — Progress Notes (Addendum)
Oldest daughter is Alexandria White at [phone] who lives in [place]. Patient informed daughter that she would be going to ED for further testing and evaluation.

## 2010-12-17 NOTE — Progress Notes (Signed)
Pt was walked to ER by myself at 10:50AM and transferred to triage nurse ER. Dr Aldine Contes talked to triage nurse. Stanton Kidney Tira Lafferty RN 12/17/10 10:50AM

## 2010-12-17 NOTE — Progress Notes (Signed)
  Subjective:    Patient ID: Alexandria White, female    DOB: 12-09-1965, 45 y.o.   MRN: [id number]  HPI  Pt is 45 yo female with PMHx outlined below who presents to [facility] with main concern of intermittent and chronic headaches that have been bothering her for approximately 2 years now. She was told she has tension type headaches but is currently not taking anything for it. Her headaches are unchanged in quality, throbbing, lasting hour at the time and not associated with visual changes, dizziness, vertigo, hearing problems, no association with fevers, chills, or other systemic symptoms of weight loss, night sweats, neck stiffness, photophobia or phonophobia. 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 She has had 1 episode of suicidal ideation approximately 1 weak ago when she tried to overdose on one of her medications. She denies any SI or HI at the time. Also denies any changes in appetite, weight loss or gain, heat or cold intolerance, no changes in sleeping patterns, no changes in energy levels. She is currently not working and has lost housing, lives with one of her children, no permanent housing.    Review of Systems Constitutional: Denies fever, chills, diaphoresis, appetite change and fatigue.  HEENT: Denies photophobia, eye pain, redness, hearing loss, ear pain, congestion, sore throat, rhinorrhea, sneezing, mouth sores, trouble swallowing, neck pain, neck stiffness and tinnitus.   Respiratory: Denies SOB, DOE, cough, chest tightness,  and wheezing.   Cardiovascular: Denies chest pain, palpitations and leg swelling.  Gastrointestinal: Denies nausea, vomiting, abdominal pain, diarrhea, constipation, blood in stool and abdominal distention.  Genitourinary: Denies dysuria, urgency, frequency, hematuria, flank pain and difficulty urinating.  Musculoskeletal: Denies myalgias, back pain, joint  swelling, arthralgias and gait problem.  Skin: Denies pallor, rash and wound.  Neurological: Denies dizziness, seizures, syncope, weakness, light-headedness, numbness.  Hematological: Denies adenopathy. Easy bruising, personal or family bleeding history  Psychiatric/Behavioral: Denies suicidal ideation at the time, mood changes, confusion, nervousness, sleep disturbance and agitation      Objective:   Physical Exam Constitutional: Vital signs reviewed.  Patient is a well-developed and well-nourished  in mild distress due to headache, cooperative with exam. Alert and oriented x3.  Cardiovascular: RRR, S1 normal, S2 normal, no MRG, pulses symmetric and intact bilaterally Pulmonary/Chest: CTAB, no wheezes, rales, or rhonchi Abdominal: Soft. Non-tender, non-distended, bowel sounds are normal, no masses, organomegaly, or guarding present.  GU: no CVA tenderness Musculoskeletal: No joint deformities, erythema, or stiffness, ROM full and no nontender Hematology: no cervical, inginal, or axillary adenopathy.  Neurological: A&O x3, Strenght is normal and symmetric bilaterally, cranial nerve II-XII are grossly intact, no focal motor deficit, sensory intact to light touch bilaterally.  Skin: Warm, dry and intact. No rash, cyanosis, or clubbing.  Psychiatric: Tired appearing and flat affect. Speech is normal. Judgment and thought content appear normal. Cognition and memory are normal.          Assessment & Plan:

## 2010-12-17 NOTE — Assessment & Plan Note (Signed)
Pt symptoms of headache and fatigue possibly related to hepatic encephalopathy. I have discussed the case with Dr. Phillips Odor and we have agreed that pt need to be further evaluated in ED, obtain LFTs, ammonia level, electrolyte panel. She will also need psych eval for suicidal ideations.

## 2010-12-18 LAB — URINE CULTURE
Culture  Setup Time: 201208221502
Culture: NO GROWTH

## 2010-12-25 ENCOUNTER — Telehealth: Payer: Self-pay | Admitting: Gastroenterology

## 2010-12-25 NOTE — Telephone Encounter (Signed)
Dr Katrinka Blazing lowered prednisone to 10 mg daily and she has become  weak and wants to know if she needs labs or can she increase the prednisone.  I advised her to contact Dr Michaelle Copas office and I would also send to Dr Christella Hartigan.  Please advise

## 2010-12-26 ENCOUNTER — Encounter: Payer: Self-pay | Admitting: Internal Medicine

## 2010-12-26 ENCOUNTER — Ambulatory Visit (INDEPENDENT_AMBULATORY_CARE_PROVIDER_SITE_OTHER): Payer: Medicaid Other | Admitting: Internal Medicine

## 2010-12-26 VITALS — BP 132/85 | HR 79 | Temp 97.4°F | Ht 61.5 in | Wt 187.7 lb

## 2010-12-26 DIAGNOSIS — R51 Headache: Secondary | ICD-10-CM

## 2010-12-26 MED ORDER — TOPIRAMATE 100 MG PO TABS: 100.0000 mg | ORAL_TABLET | Freq: Two times a day (BID) | ORAL | Status: DC

## 2010-12-26 NOTE — Assessment & Plan Note (Signed)
Patient has history of migraine headaches. She is not on any medication for it she was on Imitrex but tells me that she is unsure why that was stopped in the past. We will initiate Topamax today and I have advised patient to call us back is this is not adequate and headache control.

## 2010-12-26 NOTE — Telephone Encounter (Signed)
i agree she should contact Dr. Michaelle Copas office.  If she needs labs, we can do those here for her.

## 2010-12-26 NOTE — Progress Notes (Signed)
  Subjective:    Patient ID: [name], female    DOB: December 09, 1965, 45 y.o.   MRN: [id number]  HPI Patient is a 45 year old female who is coming to clinic for followup on her headaches. She tells me she continues to experience her headache and is aware that her recent CT of the head was normal. She would like to discuss initiating medication for her migraine headaches. She denies recent sicknesses, no chest pain or shortness of breath, no abdominal or urinary concerns, no numbness, weakness, visual changes or tinnitus. No fevers or chills, no neck stiffness.   Review of Systems Per HPI    Objective:   Physical Exam   Constitutional: Vital signs reviewed.  Patient is a well-developed and well-nourished in no acute distress and cooperative with exam. Alert and oriented x3.  Neck: Supple, Trachea midline normal ROM, No JVD, mass, thyromegaly, or carotid bruit present.  Cardiovascular: RRR, S1 normal, S2 normal, no MRG, pulses symmetric and intact bilaterally Pulmonary/Chest: CTAB, no wheezes, rales, or rhonchi Abdominal: Soft. Non-tender, non-distended, bowel sounds are normal, no masses, organomegaly, or guarding present.  Neurological: A&O x3, Strenght is normal and symmetric bilaterally, cranial nerve II-XII are grossly intact, no focal motor deficit, sensory intact to light touch bilaterally.  Psychiatric: Normal mood and affect. speech and behavior is normal. Judgment and thought content normal. Cognition and memory are normal.        Assessment & Plan:

## 2010-12-30 ENCOUNTER — Ambulatory Visit: Payer: Medicaid Other | Admitting: Internal Medicine

## 2011-01-15 ENCOUNTER — Other Ambulatory Visit: Payer: Self-pay | Admitting: Internal Medicine

## 2011-01-30 ENCOUNTER — Ambulatory Visit (INDEPENDENT_AMBULATORY_CARE_PROVIDER_SITE_OTHER): Payer: Medicaid Other | Admitting: Internal Medicine

## 2011-01-30 ENCOUNTER — Encounter: Payer: Self-pay | Admitting: Internal Medicine

## 2011-01-30 DIAGNOSIS — R05 Cough: Secondary | ICD-10-CM | POA: Insufficient documentation

## 2011-01-30 DIAGNOSIS — I10 Essential (primary) hypertension: Secondary | ICD-10-CM

## 2011-01-30 DIAGNOSIS — E876 Hypokalemia: Secondary | ICD-10-CM | POA: Insufficient documentation

## 2011-01-30 LAB — BASIC METABOLIC PANEL
BUN: 13 mg/dL (ref 6–23)
CO2: 26 mEq/L (ref 19–32)
Chloride: 106 mEq/L (ref 96–112)
Creat: 0.89 mg/dL (ref 0.50–1.10)

## 2011-01-30 MED ORDER — DOXYCYCLINE HYCLATE 100 MG PO TABS: 100.0000 mg | ORAL_TABLET | Freq: Two times a day (BID) | ORAL | Status: DC

## 2011-01-30 MED ORDER — PHENYLEPHRINE-CHLORPHEN-DM 3.5-1-3 MG/ML PO LIQD: 1.0000 mL | Freq: Four times a day (QID) | ORAL | Status: AC | PRN

## 2011-01-30 MED ORDER — DOXYCYCLINE HYCLATE 100 MG PO TABS: 100.0000 mg | ORAL_TABLET | Freq: Two times a day (BID) | ORAL | Status: AC

## 2011-01-30 MED ORDER — PHENYLEPHRINE-CHLORPHEN-DM 3.5-1-3 MG/ML PO LIQD: 1.0000 mL | Freq: Four times a day (QID) | ORAL | Status: DC | PRN

## 2011-01-30 NOTE — Progress Notes (Signed)
  Subjective:   Patient ID: Alexandria White female   DOB: May 20, 1965 45 y.o.   MRN: [id number]  HPI: [name] is a 45 y.o.  Female with PMH as outlined below who presented to the clinic for a cough. 1. Cough: 2 weeks ago started to have cold: congestion, runny nose, tightness and cough, productive  With greenish and yellow sputum, she noted some wheezing. No fevers or chills. No sick contact.  No travel.  2.  nausea and vomiting: started 2 weeks ago now resolved. She was started on one medication by Psychiatrist. She can not recall which but it was stopped by provider and since she does not had nausea or vomiting.   Past Medical History  Diagnosis Date  . Cystic acne   . Fatigue   . Thumb pain   . Cirrhosis     Autoimmune, elevated IgG, followed by Dr. Christella Hartigan and Dr. Cleda Clarks ([facility])  . Hashimoto's thyroiditis     hx of   . Perimenopausal   . History of macrocytic anemia   . Tobacco abuse   . HTN (hypertension)   . GERD (gastroesophageal reflux disease)   . Headache     tension with features of migraine  . At risk for colon cancer     2008 colonoscopy negative, next due 2018    Review of Systems: Constitutional: Denies fever, chills, diaphoresis, appetite change and fatigue.  Respiratory: Noted SOB, DOE, cough, chest tightness,  and wheezing.   Cardiovascular: Denies chest pain, palpitations and leg swelling.  Gastrointestinal: Denies now nausea, vomiting, abdominal pain, diarrhea, constipation, blood in stool and abdominal distention.  Genitourinary: Denies dysuria, urgency, frequency, hematuria, flank pain and difficulty urinating.   Neurological: Denies dizziness,  light-headedness, numbness and headaches.    Objective:  Physical Exam: Filed Vitals:   01/30/11 1514  BP: 152/96  Pulse: 105  Temp: 98.9 F (37.2 C)  TempSrc: Oral  Height: 5' 1.5" (1.562 m)  Weight: 189 lb 8 oz (85.957 kg)  SpO2: 97%   Constitutional: Vital signs reviewed.  Patient is a  well-developed and well-nourished  in no acute distress and cooperative with exam. Alert and oriented x3.  Mouth: post nasal dripping present. No erythema.  Neck: Supple, Cardiovascular: RRR, S1 normal, S2 normal, no MRG, pulses symmetric and intact bilaterally Pulmonary/Chest: wheezing present. Some course breath sound throughout.  Abdominal: Soft. Non-tender, non-distended, bowel sounds are normal, no masses, organomegaly, or guarding present.  Neurological: A&O x3, no focal motor deficit, sensory intact to light touch bilaterally.  Skin: Warm, dry and intact. No rash, cyanosis, or clubbing.

## 2011-01-30 NOTE — Assessment & Plan Note (Signed)
Likely acute bronchitis. Will start patient on 7 day course of Doxycycline and Cardec prn for cough. I informed the patient if she notices worsening symptoms with fevers and chills she needs to be seen.

## 2011-01-30 NOTE — Assessment & Plan Note (Signed)
Low K noted on Bmet in August. Patient is not on any potassium at this point. Will recheck it today and change management accordingly.

## 2011-01-30 NOTE — Assessment & Plan Note (Signed)
Blood pressure elevated. Since patient was noted to have acute bronchitis I did not wanted to make any changes at this point. I will have her come back in the next 2-3 weeks and reevaluate the blood pressure.  BP Readings from Last 3 Encounters:  01/30/11 152/96  12/26/10 132/85  12/17/10 135/85

## 2011-01-30 NOTE — Progress Notes (Deleted)
  Subjective:    Patient ID: Alexandria White, female    DOB: [birth year], 45 y.o.   MRN: [id number]  HPI    Review of Systems     Objective:   Physical Exam        Assessment & Plan:

## 2011-02-21 ENCOUNTER — Other Ambulatory Visit: Payer: Self-pay | Admitting: Gastroenterology

## 2011-02-26 ENCOUNTER — Encounter: Payer: Self-pay | Admitting: Internal Medicine

## 2011-02-26 ENCOUNTER — Ambulatory Visit (INDEPENDENT_AMBULATORY_CARE_PROVIDER_SITE_OTHER): Payer: Medicaid Other | Admitting: Internal Medicine

## 2011-02-26 VITALS — BP 112/70 | HR 98 | Temp 97.0°F | Ht 61.5 in | Wt 188.6 lb

## 2011-02-26 DIAGNOSIS — Z23 Encounter for immunization: Secondary | ICD-10-CM

## 2011-02-26 DIAGNOSIS — I10 Essential (primary) hypertension: Secondary | ICD-10-CM

## 2011-02-26 DIAGNOSIS — Z0000 Encounter for general adult medical examination without abnormal findings: Secondary | ICD-10-CM

## 2011-02-26 DIAGNOSIS — K219 Gastro-esophageal reflux disease without esophagitis: Secondary | ICD-10-CM

## 2011-02-26 MED ORDER — VITAMIN D 1000 UNITS PO TABS: 1000.0000 [IU] | ORAL_TABLET | Freq: Every day | ORAL | Status: DC

## 2011-02-26 NOTE — Assessment & Plan Note (Signed)
Flu shot today.  Bone density scan given prolonged use of glucocorticoids. Vitamin D supplementation.

## 2011-02-26 NOTE — Assessment & Plan Note (Signed)
Lab Results  Component Value Date   NA 142 01/30/2011   K 4.1 01/30/2011   CL 106 01/30/2011   CO2 26 01/30/2011   BUN 13 01/30/2011   CREATININE 0.89 01/30/2011   CREATININE 0.69 12/17/2010    BP Readings from Last 3 Encounters:  02/26/11 112/70  01/30/11 152/96  12/26/10 132/85    Assessment: Hypertension control:  controlled  Progress toward goals:  at goal Barriers to meeting goals:  no barriers identified  Plan: Hypertension treatment:  continue current medications

## 2011-02-26 NOTE — Patient Instructions (Signed)
Please start taking Vitamin D daily.  Please follow up in 3 months. Please continue to take all medications as directed.

## 2011-02-26 NOTE — Assessment & Plan Note (Signed)
EGD done 11/18/2010 showing mild gastritis without evidence of H Pylori. Currently asymptomatic. Continue PPI.

## 2011-02-26 NOTE — Progress Notes (Signed)
  Subjective:    Patient ID: Alexandria White, female    DOB: [birth year], 45 y.o.   MRN: [id number]  HPI  [name] is a 45 year old woman with pmh significant for HTN, Hashimotos thyroditis, GERD and Cirrhosis who presents to the clinic for the following:  1.) Autoimmune cirrhosis - still followed by GI physician at [facility]. Patient is on 20 mg of Prednisone daily.   2.) Menopausal symptoms - hot flashes have slowed down some. Patient's last period was in August 2012.   3.) HTN - taking all medications as directed.   Patient has no other complaints or concerns today. She denies chest pain, cough, sob, headache, N/V, changes in abdominal and urinary character.   Review of Systems  All other systems reviewed and are negative.       Objective:   Physical Exam  Constitutional: She is oriented to person, place, and time. She appears well-developed.  HENT:  Head: Normocephalic and atraumatic.  Eyes: Pupils are equal, round, and reactive to light.  Neck: Normal range of motion. Neck supple.  Cardiovascular: Normal rate and regular rhythm.   Pulmonary/Chest: Effort normal and breath sounds normal.  Abdominal: Soft. Bowel sounds are normal.  Musculoskeletal: Normal range of motion.  Neurological: She is alert and oriented to person, place, and time.  Psychiatric: She has a normal mood and affect.          Assessment & Plan:

## 2011-02-27 ENCOUNTER — Telehealth: Payer: Self-pay | Admitting: Gastroenterology

## 2011-02-27 MED ORDER — PREDNISONE 10 MG PO TABS: 10.0000 mg | ORAL_TABLET | Freq: Every day | ORAL | Status: DC

## 2011-02-27 NOTE — Telephone Encounter (Signed)
Yes, 10mg  pills, take as directed. Disp 100 pills, 2 refills. When is she seeing myself or Dr. Katrinka Blazing at [facility] next?

## 2011-02-27 NOTE — Telephone Encounter (Signed)
Pt notified meds sent to pharmacy, her appt with Dr Katrinka Blazing is sometime this month she is not sure of the date

## 2011-02-27 NOTE — Telephone Encounter (Signed)
Dr Christella Hartigan do you want to refill her prednisone?

## 2011-03-03 ENCOUNTER — Ambulatory Visit (HOSPITAL_COMMUNITY)
Admission: RE | Admit: 2011-03-03 | Discharge: 2011-03-03 | Disposition: A | Discharge status: Home / Self Care | Payer: Medicaid Other | Source: Ambulatory Visit | Attending: Internal Medicine | Admitting: Internal Medicine

## 2011-03-03 DIAGNOSIS — Z0000 Encounter for general adult medical examination without abnormal findings: Secondary | ICD-10-CM

## 2011-03-03 DIAGNOSIS — Z79899 Other long term (current) drug therapy: Secondary | ICD-10-CM | POA: Insufficient documentation

## 2011-03-03 DIAGNOSIS — Z13820 Encounter for screening for osteoporosis: Secondary | ICD-10-CM | POA: Insufficient documentation

## 2011-03-25 ENCOUNTER — Other Ambulatory Visit: Payer: Self-pay | Admitting: Internal Medicine

## 2011-05-29 ENCOUNTER — Encounter: Payer: Self-pay | Admitting: Internal Medicine

## 2011-05-29 ENCOUNTER — Ambulatory Visit (INDEPENDENT_AMBULATORY_CARE_PROVIDER_SITE_OTHER): Payer: Medicaid Other | Admitting: Internal Medicine

## 2011-05-29 VITALS — BP 148/96 | HR 87 | Temp 97.3°F | Ht 61.5 in | Wt 196.0 lb

## 2011-05-29 DIAGNOSIS — I10 Essential (primary) hypertension: Secondary | ICD-10-CM

## 2011-05-29 DIAGNOSIS — E063 Autoimmune thyroiditis: Secondary | ICD-10-CM

## 2011-05-29 DIAGNOSIS — K7460 Unspecified cirrhosis of liver: Secondary | ICD-10-CM

## 2011-05-29 LAB — COMPREHENSIVE METABOLIC PANEL
ALT: 61 U/L — ABNORMAL HIGH (ref 0–35)
Albumin: 3.8 g/dL (ref 3.5–5.2)
BUN: 14 mg/dL (ref 6–23)
Calcium: 9.4 mg/dL (ref 8.4–10.5)
Chloride: 106 mEq/L (ref 96–112)
Glucose, Bld: 65 mg/dL — ABNORMAL LOW (ref 70–99)
Potassium: 3.4 mEq/L — ABNORMAL LOW (ref 3.5–5.3)
Total Bilirubin: 0.9 mg/dL (ref 0.3–1.2)

## 2011-05-29 MED ORDER — CLONIDINE HCL 0.1 MG PO TABS: 0.2000 mg | ORAL_TABLET | Freq: Every day | ORAL | Status: DC

## 2011-05-29 NOTE — Patient Instructions (Signed)
Please follow up with Dr. Larey Dresser at [facility] within this month. Please start taking clonidine 0.2 mg at bedtime. Please follow up as previously scheduled.  Please increase physical activity.

## 2011-05-29 NOTE — Assessment & Plan Note (Signed)
Lab Results  Component Value Date   NA 142 01/30/2011   K 4.1 01/30/2011   CL 106 01/30/2011   CO2 26 01/30/2011   BUN 13 01/30/2011   CREATININE 0.89 01/30/2011   CREATININE 0.69 12/17/2010    BP Readings from Last 3 Encounters:  05/29/11 148/96  02/26/11 112/70  01/30/11 152/96    Assessment: Hypertension control:  mildly elevated  Progress toward goals:  deteriorated Barriers to meeting goals:  no barriers identified  Plan: Hypertension treatment:  continue current medications Upon recheck mildly elevated at 145/90. Will not make any changes to regimen and continue to monitor. Consider adding another agent if continues to rise.

## 2011-05-30 NOTE — Assessment & Plan Note (Signed)
Patient was diagnosed with Hashimoto thyroiditis back in 2008. She had a thyroid scan done which showed a mildly enlarged thyroid gland radioactive iodine uptake was within normal limits. Patient was not started on any thyroid medication. Patient's thyroid peroxidase antibody levels were elevated. Patient has been getting thyroid functions tests periodically. Her TSH has been in the normal range however it is at the very low normal range. Free T4 has also been in the low normal range. This does not correspond with hypothyroidism. Patient does have fatigue and weight gain that may be attributed to autoimmune hepatitis and prednisone therapy. However her symptoms may also be related to her thyroid dysfunction. I am not quite sure what to make of her low normal TSH. Will look into this matter further by contacting an endocrinologist for their input. Patient is relatively asymptomatic for now and thus we'll not start any medication at this point.  Lab Results  Component Value Date   TSH 0.385 05/29/2011

## 2011-05-30 NOTE — Progress Notes (Signed)
Subjective:     Patient ID: Alexandria White, female   DOB: [birth year], 46 y.o.   MRN: [id number]  HPI Alexandria White is a 46 year old woman with pmh significant for autoimmune hepatits and HTN who presents to the clinic for general check up.   1.) Fatigue - still there, but patient is able to cope with it. Denies any physical activity. 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   2.) Weight gain - mostly truncal region. Thought to be attributed to prednisone therapy, increased po intake and lack of physical activity.   3.) Hot flashes - was started on low dose clonidine at bedtime. Pt still with hot flashes, not improved, but once again patient able to cope with them.   No other complaints or concerns today.  Review of Systems  All other systems reviewed and are negative.       Objective:   Physical Exam  Constitutional: She appears well-developed.  HENT:  Head: Normocephalic.  Eyes: Pupils are equal, round, and reactive to light.  Neck: Normal range of motion. Neck supple.  Cardiovascular: Normal rate and regular rhythm.   Pulmonary/Chest: Effort normal and breath sounds normal.  Abdominal: Soft. Bowel sounds are normal. She exhibits no distension.  Musculoskeletal: Normal range of motion.

## 2011-05-30 NOTE — Assessment & Plan Note (Addendum)
Patient on chronic prednisone therapy for a hepatitis. Patient is followed by hepatologist at [facility]. Patient has not seen her hepatologist in quite a while do to transportation issues. Patient still reports fatigue however has been at her baseline. Advised patient to schedule an appointment with her hepatologist in the near future. We'll continue CellCept and prednisone for now. Also check CMET to monitor LFTs. Pt can take these results to [facility]. A bone density scan was checked as patient has been on chronic prednisone therapy and bone density is within normal limits.

## 2011-06-26 ENCOUNTER — Other Ambulatory Visit: Payer: Self-pay

## 2011-06-26 DIAGNOSIS — K754 Autoimmune hepatitis: Secondary | ICD-10-CM

## 2011-06-26 NOTE — Progress Notes (Signed)
Per orders from Dr Christella Hartigan.  See Scanned notes from [facility].  Needs LFTs in 2 weeks.  I have left her a voicemail that she will need to come for labs and we will call and remind her.  Labs will need to be faxed to Dr Burtis Junes at [facility]

## 2011-06-29 ENCOUNTER — Encounter: Payer: Medicaid Other | Admitting: Internal Medicine

## 2011-07-02 ENCOUNTER — Encounter: Payer: Medicaid Other | Admitting: Internal Medicine

## 2011-07-08 ENCOUNTER — Other Ambulatory Visit: Payer: Self-pay | Admitting: Internal Medicine

## 2011-07-09 ENCOUNTER — Encounter: Payer: Self-pay | Admitting: Internal Medicine

## 2011-07-09 ENCOUNTER — Ambulatory Visit (INDEPENDENT_AMBULATORY_CARE_PROVIDER_SITE_OTHER): Payer: Medicaid Other | Admitting: Internal Medicine

## 2011-07-09 VITALS — BP 148/96 | HR 70 | Temp 97.4°F | Ht 61.0 in | Wt 199.1 lb

## 2011-07-09 DIAGNOSIS — M542 Cervicalgia: Secondary | ICD-10-CM

## 2011-07-09 DIAGNOSIS — E876 Hypokalemia: Secondary | ICD-10-CM

## 2011-07-09 LAB — BASIC METABOLIC PANEL
Chloride: 102 mEq/L (ref 96–112)
Potassium: 3.7 mEq/L (ref 3.5–5.3)
Sodium: 140 mEq/L (ref 135–145)

## 2011-07-09 MED ORDER — LISINOPRIL 40 MG PO TABS: 40.0000 mg | ORAL_TABLET | Freq: Every day | ORAL | Status: DC

## 2011-07-09 NOTE — Assessment & Plan Note (Signed)
K. slightly low at 3.4 when last checked in February. Will repeat basic metabolic panel today to assess electrolyte status.  Patient asymptomatic

## 2011-07-09 NOTE — Patient Instructions (Signed)
Schedule a follow up appointment if your pain is not improved after 2 weeks. If you develop any fever, shaking chills, severe pain, new tingling/numbness/weakness in your arms, or other concerning symptom, make an appointment or go to the ER. Try over the counter Tylenol for additional pain relief.  Use as directed on the bottle. Continue to use a warm heating pad to relax the muscles of your neck and shoulder. Massage will also significantly help ease your pain. Please have your doctor send [place] the results of your lab work.

## 2011-07-09 NOTE — Assessment & Plan Note (Signed)
I believe patient's acute neck pain is the result of musculoskeletal strain.  There is no evidence of neural impingement and no point tenderness to palpation that would suggest vertebral fracture.  Contacted the pharmacist to Discussuse of tramadol and Flexeril in combination with CellCept; pharmacist Homero Fellers) advised avoidance of Flexeril and CellCept.  He also stated that there is not much information regarding use of tramadol and CellCept and that while what is available does not indicate any significant interaction, he would still recommend against that if other options are available.  Patient is safe to use over-the-counter Tylenol - advised her to try this for pain relief.  Advised her to continue with use of warm compresses for relief of muscle tension. Also advised her to try massage for additional relief.  Advised her to return to the clinic if her pain is not improved after 14 days or if she develops new tingling, numbness, weakness in her arms, severe pain, or fevers or chills.

## 2011-07-09 NOTE — Progress Notes (Signed)
Patient ID: Alexandria White, female   DOB: 1966/03/24, 46 y.o.   MRN: [id number]  Subjective:    HPI Ms. Ferger is a 46 year old woman here today with c/o neck pain.  1. Neck pain:  Pt reports left sided neck pain.  States pain began on the right and moved to the left.  The pain extended into her occipital scalp.  She states the pain began 3 days ago; started as an ache when she turned her head.  Pain is worse with left side bending.  She denies fevers/chills.  States pain does not radiate.  She denies any new tingling, numbness, or weakness since onset of neck pain.  2: Leg swelling: pt reports swelling in bilateral feet.  This has resolved after she propped her feet up above her heart. She states now her left knee is swollen.  She states her knee feel "twisted to the left side."  She has had problems with pain in her let knee for > 46month.  She denies trauma.  No other complaints or concerns today.  Review of Systems   Constitutional: Negative for fever, chills, diaphoresis, activity change, appetite change, fatigue and unexpected weight change.  HENT: Negative for hearing loss, congestion and neck stiffness.   Eyes: Negative for photophobia, pain and visual disturbance.  Respiratory: Negative for cough, chest tightness, shortness of breath and wheezing.   Cardiovascular: Negative for chest pain and palpitations.  Gastrointestinal: Negative for abdominal pain, blood in stool and anal bleeding.  Genitourinary: Negative for dysuria, hematuria and difficulty urinating.  Musculoskeletal: Negative for joint swelling.  Neurological: Negative for dizziness, syncope, speech difficulty, weakness, numbness and headaches.       Objective:   Physical Exam  VItal signs reviewed and White. GEN: No apparent distress.  Alert and oriented x 3.  Pleasant, conversant, and cooperative to exam. HEENT: head is autraumatic and normocephalic.  Neck is supple without palpable masses or lymphadenopathy.   Vision intact.  EOMI.  PERRLA.  Sclerae anicteric.   RESP:  Lungs are clear to ascultation bilaterally with good air movement.  No wheezes, ronchi, or rubs. CARDIOVASCULAR: regular rate, normal rhythm.  Clear S1, S2, no murmurs, gallops, or rubs. ABDOMEN: soft, non-tender, non-distended.  Bowels sounds present in all quadrants and normoactive.  No palpable masses. EXT: warm and dry.  Peripheral pulses equal, intact, and +2 globally.  No clubbing or cyanosis.  No edema in bilateral lower extremities. MSK:  Passive and active range of motion intact of her bilateral knees; no ligamentous laxity noted. No joint erythema, swelling, warmth, or tenderness to palpation .Neck range of motion slightly limited with left side bending and left rotation; otherwise fully intact. Tension and tenderness to palpation over bilateral cervical paraspinal musculature as well as along the left trapezius and left supraspinatus.  No point tenderness along spinous or transverse processes throughout spine.   SKIN: warm and dry with normal turgor.  No rashes or abnormal lesions observed. NEURO: CN II-XII grossly intact.  Muscle strength +5/5 in bilateral upper and lower extremities.  Sensation is grossly intact.  No focal deficit.

## 2011-07-13 ENCOUNTER — Other Ambulatory Visit (INDEPENDENT_AMBULATORY_CARE_PROVIDER_SITE_OTHER): Payer: Medicaid Other

## 2011-07-13 DIAGNOSIS — K754 Autoimmune hepatitis: Secondary | ICD-10-CM

## 2011-07-13 LAB — HEPATIC FUNCTION PANEL
ALT: 52 U/L — ABNORMAL HIGH (ref 0–35)
Alkaline Phosphatase: 96 U/L (ref 39–117)
Bilirubin, Direct: 0.2 mg/dL (ref 0.0–0.3)
Total Protein: 7.2 g/dL (ref 6.0–8.3)

## 2011-07-20 ENCOUNTER — Other Ambulatory Visit: Payer: Self-pay | Admitting: *Deleted

## 2011-07-20 MED ORDER — CLONIDINE HCL 0.1 MG PO TABS: 0.2000 mg | ORAL_TABLET | Freq: Every day | ORAL | Status: DC

## 2011-07-22 ENCOUNTER — Encounter: Payer: Self-pay | Admitting: *Deleted

## 2011-07-22 DIAGNOSIS — M2550 Pain in unspecified joint: Secondary | ICD-10-CM

## 2011-07-22 NOTE — Progress Notes (Signed)
Pt presents at front desk wanting a referral to [facility] for pain in her knees, she was seen 3/14 for this as well as other joint problems, states dr mills told her to just call [facility] and make an appt but when she did she was told she needed a referral. Please have your nurse complete the referral

## 2011-07-27 ENCOUNTER — Other Ambulatory Visit: Payer: Self-pay | Admitting: Internal Medicine

## 2011-07-27 DIAGNOSIS — M1990 Unspecified osteoarthritis, unspecified site: Secondary | ICD-10-CM

## 2011-07-27 DIAGNOSIS — M79673 Pain in unspecified foot: Secondary | ICD-10-CM

## 2011-07-27 DIAGNOSIS — M25562 Pain in left knee: Secondary | ICD-10-CM

## 2011-07-28 ENCOUNTER — Other Ambulatory Visit: Payer: Self-pay

## 2011-07-28 ENCOUNTER — Other Ambulatory Visit (INDEPENDENT_AMBULATORY_CARE_PROVIDER_SITE_OTHER): Payer: Medicaid Other

## 2011-07-28 DIAGNOSIS — K754 Autoimmune hepatitis: Secondary | ICD-10-CM

## 2011-07-28 LAB — CBC WITH DIFFERENTIAL/PLATELET
Basophils Absolute: 0.1 10*3/uL (ref 0.0–0.1)
Eosinophils Absolute: 0 10*3/uL (ref 0.0–0.7)
Hemoglobin: 13.9 g/dL (ref 12.0–15.0)
Lymphocytes Relative: 22.7 % (ref 12.0–46.0)
MCHC: 32.9 g/dL (ref 30.0–36.0)
Neutro Abs: 5.3 10*3/uL (ref 1.4–7.7)
Neutrophils Relative %: 65.6 % (ref 43.0–77.0)
RDW: 14.9 % — ABNORMAL HIGH (ref 11.5–14.6)

## 2011-07-28 LAB — HEPATIC FUNCTION PANEL
Albumin: 3.9 g/dL (ref 3.5–5.2)
Alkaline Phosphatase: 100 U/L (ref 39–117)

## 2011-07-28 NOTE — Progress Notes (Signed)
Pt brought in an order form to have standing labs every 2 weeks for Dr Katrinka Blazing at [facility].  It is easier for her to have her labs here.  They have been entered and pt has gone  to the lab today for first set.  The results should be sent to [phone] Dr Katrinka Blazing   The labs have been entered into [employer] as standing biweekly labs with the fax information.

## 2011-08-03 ENCOUNTER — Other Ambulatory Visit: Payer: Self-pay | Admitting: Internal Medicine

## 2011-08-03 DIAGNOSIS — Z1231 Encounter for screening mammogram for malignant neoplasm of breast: Secondary | ICD-10-CM

## 2011-08-04 ENCOUNTER — Encounter (HOSPITAL_COMMUNITY): Payer: Self-pay

## 2011-08-04 ENCOUNTER — Telehealth: Payer: Self-pay | Admitting: *Deleted

## 2011-08-04 ENCOUNTER — Emergency Department (INDEPENDENT_AMBULATORY_CARE_PROVIDER_SITE_OTHER)
Admission: EM | Admit: 2011-08-04 | Discharge: 2011-08-04 | Disposition: A | Discharge status: Home / Self Care | Payer: Medicaid Other | Source: Home / Self Care | Attending: Emergency Medicine | Admitting: Emergency Medicine

## 2011-08-04 DIAGNOSIS — K625 Hemorrhage of anus and rectum: Secondary | ICD-10-CM

## 2011-08-04 DIAGNOSIS — Z1231 Encounter for screening mammogram for malignant neoplasm of breast: Secondary | ICD-10-CM | POA: Insufficient documentation

## 2011-08-04 LAB — OCCULT BLOOD, POC DEVICE: Fecal Occult Bld: NEGATIVE

## 2011-08-04 NOTE — Telephone Encounter (Signed)
Pt called and left a message; she had scheduled her mammogram appt for May 1.  But she needs an order for mammogram. Thanks

## 2011-08-04 NOTE — Telephone Encounter (Signed)
Dr Meredith Pel placed order on 4/8. Rad just needs to release that order.

## 2011-08-04 NOTE — Discharge Instructions (Signed)
   Your exam currently suggests that you're bleeding could have stopped as no microscopic blood was noted during your exam and through specific test (Hemoccult). Have suggested that if increased abdominal pain or further bleeding whether his rectally or any vomiting with blood should go immediately to the emergency department and should call your gastroenterologist. Rectal Bleeding  Rectal bleeding is when blood comes out of the opening of the butt (anus). Rectal bleeding may show up as bright red blood or really dark poop (stool). The poop may look dark red, maroon, or black. Rectal bleeding is often a sign that something is wrong. This needs to be checked by a doctor.  HOME CARE  Eat a diet high in fiber. This will help keep your poop soft.   Limit activitiy.   Drink enough fluids to keep your pee (urine) clear or pale yellow.   Take a warm bath to soothe any pain.   Follow up with your doctor as told.  GET HELP RIGHT AWAY IF:  You have more bleeding.   You have black or dark red poop.   You throw up (vomit) blood or it looks like coffee grounds.   You have belly (abdominal) pain or tenderness.   You have a fever.   You feel weak, sick to your stomach (nauseous), or you pass out (faint).   You have pain that is so bad you cannot poop (bowel movement).  MAKE SURE YOU:  Understand these instructions.   Will watch your condition.   Will get help right away if you are not doing well or get worse.  Document Released: 12/24/2010 Document Revised: 04/02/2011 Document Reviewed: 12/24/2010 [facility] Patient Information 2012 [place], [place].

## 2011-08-04 NOTE — ED Notes (Signed)
C/o rectal bleeding since Thursday.  States hx of this due to internal hemorrhoids per colonoscopy in 2008 and 2012.  No bleeding in 6 months. Also c/o intermittent sharp shooting abdominal pain for 2 years- states has been having sharp peri- umbical pain that is intermittent  for 17 days.

## 2011-08-04 NOTE — ED Provider Notes (Signed)
History     CSN: [id number]  Arrival date & time 08/04/11  1329   First MD Initiated Contact with Patient 08/04/11 1435      Chief Complaint  Patient presents with  . Rectal Bleeding    (Consider location/radiation/quality/duration/timing/severity/associated sxs/prior treatment) HPI Comments: Patient presents today to urgent care complaining of ongoing recurrent rectal bleeding is since Thursday she didn't notice that her last one early this morning was somewhat clotted as it was different from previous one that look more like bright red blood patient denies any rectal pain, any dizziness. Have been expressing some upper abdominal discomfort. Patient describes that she has not had any interaction with her gastroenterologist. She feels like she has not been eating well she was also afraid to take them will make her bleed more.  Patient denies any nausea, vomiting. Patient describes in the past as she has had some endoscopic studies she was told that she has internal hemorrhoids and that they bleed occasionally.  Patient very cumbersome looks comfortable in no distress.  Patient is a 46 y.o. female presenting with hematochezia. The history is provided by the patient.  Rectal Bleeding  The current episode started 5 to 7 days ago. The problem occurs occasionally. The problem has been gradually improving. The patient is experiencing no pain. The stool is described as bloody. There was no prior successful therapy. Associated symptoms include abdominal pain and hemorrhoids. Pertinent negatives include no fever, no diarrhea, no hematemesis, no nausea, no rectal pain, no vomiting, no vaginal bleeding, no vaginal discharge, no headaches, no coughing, no difficulty breathing and no rash.    Past Medical History  Diagnosis Date  . Cystic acne   . Fatigue   . Thumb pain   . Cirrhosis     Autoimmune, elevated IgG, followed by Dr. Christella Hartigan and Dr. Cleda Clarks ([facility])  . Hashimoto's thyroiditis     hx of     . Perimenopausal   . History of macrocytic anemia   . Tobacco abuse   . HTN (hypertension)   . GERD (gastroesophageal reflux disease)   . Headache     tension with features of migraine  . At risk for colon cancer     2008 colonoscopy negative, next due 2018    Past Surgical History  Procedure Date  . Cholecystectomy 1992    laparoscopically   . Ercp   . Colonoscopy   . Tubal fibroid removed     Family History  Problem Relation Age of Onset  . Diabetes Mother   . Migraines Mother   . Cancer Brother     History  Substance Use Topics  . Smoking status: Current Everyday Smoker -- 0.2 packs/day for 3 years    Types: Cigarettes  . Smokeless tobacco: Never Used   Comment: 3-4cigs/day.  Cutting back  . Alcohol Use: No    OB History    Grav Para Term Preterm Abortions TAB SAB Ect Mult Living                  Review of Systems  Constitutional: Positive for appetite change. Negative for fever, diaphoresis and activity change.  Respiratory: Negative for cough and shortness of breath.   Gastrointestinal: Positive for abdominal pain, blood in stool, hematochezia, anal bleeding and hemorrhoids. Negative for nausea, vomiting, diarrhea, rectal pain and hematemesis.  Genitourinary: Negative for vaginal bleeding and vaginal discharge.  Skin: Negative for pallor and rash.  Neurological: Negative for dizziness and headaches.    Allergies  Aspirin and Latex  Home Medications   Current Outpatient Rx  Name Route Sig Dispense Refill  . VITAMIN D 1000 UNITS PO TABS Oral Take 1 tablet (1,000 Units total) by mouth daily. 30 tablet 11  . HYDROCHLOROTHIAZIDE 25 MG PO TABS  TAKE 1 TABLET BY MOUTH ONCE A DAY 30 tablet 6  . LISINOPRIL 40 MG PO TABS Oral Take 1 tablet (40 mg total) by mouth daily. 30 tablet 3  . MYCOPHENOLATE MOFETIL 250 MG PO CAPS Oral Take 1,000 mg by mouth 2 (two) times daily.      Marland Kitchen OMEPRAZOLE 40 MG PO CPDR Oral Take 1 capsule (40 mg total) by mouth 2 (two) times  daily before a meal. 60 capsule 11  . PREDNISONE 10 MG PO TABS Oral Take 20 mg by mouth daily. As directed    . CLONIDINE HCL 0.1 MG PO TABS Oral Take 2 tablets (0.2 mg total) by mouth at bedtime. 30 tablet 0  . LORATADINE 10 MG PO TABS Oral Take 1 tablet (10 mg total) by mouth daily. 30 tablet 0  . TOPIRAMATE 100 MG PO TABS Oral Take 1 tablet (100 mg total) by mouth 2 (two) times daily. 60 tablet 1    BP 154/99  Pulse 84  Temp(Src) 98.4 F (36.9 C) (Oral)  Resp 18  SpO2 99%  Physical Exam  Nursing note and vitals reviewed. Constitutional: She appears well-developed and well-nourished. No distress.  Pulmonary/Chest: Effort normal.  Abdominal: Soft. Normal appearance. She exhibits no distension and no mass. There is no tenderness. There is no rebound and no guarding.    Skin: No rash noted.    ED Course  Procedures (including critical care time)   Labs Reviewed  OCCULT BLOOD, POC DEVICE   No results found.   1. Rectal bleeding       MDM  Patient presents to urgent care complaining of ongoing recurrent rectal bleeding since Thursday. Patient looks comfortable and is hemodynamically stable not tachycardic nondiaphoretic no complaints of dizziness. On further interview she reveals that this morning she did notice her had some blood minimal blood dark like clotted. Patient denies any current abdominal pain. As patient has many potential sources for GI bleed have recommended that she call her gastroenterologist and she goes to the emergency department if any further episode. Specially rectal bleeding or abdominal pain or vomiting. On rectal exam patient had minimal amount of normal color stool that tested negative Hemoccult test. No obvious source of rectal bleeding was noted on exam. Have discussed with patient need to followup with her gastroenterologist and advised her to go to the emergency department if any further bleeding patient understands and agrees with treatment plan  expected management and followup care as necessary        Jimmie Molly, MD 08/04/11 1600

## 2011-08-05 ENCOUNTER — Telehealth: Payer: Self-pay | Admitting: *Deleted

## 2011-08-05 DIAGNOSIS — Z1231 Encounter for screening mammogram for malignant neoplasm of breast: Secondary | ICD-10-CM

## 2011-08-05 NOTE — Telephone Encounter (Signed)
Not sure what you are asking me to do from a computer standpoint.  I just went into pending orders and released the mammogram order.  If that was the incorrect thing to do please let me know EXACTLY what you want me to do.  Thanks.

## 2011-08-05 NOTE — Telephone Encounter (Signed)
Radiology stated it has to be signed by the MD whose name is on the order.  And it should had went to Dr Meredith Pel' in-box per radiology.

## 2011-08-05 NOTE — Telephone Encounter (Signed)
Pt need mammogram order b/c she has Medicaid.  I called Rodman Pickle, radiology at [facility], she had put in the current order. She stated it can be signed by the doctor if he can do this or just fax over an order. Thanks

## 2011-08-06 ENCOUNTER — Other Ambulatory Visit: Payer: Self-pay | Admitting: Internal Medicine

## 2011-08-06 DIAGNOSIS — Z1231 Encounter for screening mammogram for malignant neoplasm of breast: Secondary | ICD-10-CM

## 2011-08-06 NOTE — Telephone Encounter (Signed)
New order entered by Dr Rogelia Boga; [facility] radiology Victorino Dike) made awared.

## 2011-08-17 ENCOUNTER — Telehealth: Payer: Self-pay

## 2011-08-17 DIAGNOSIS — K7460 Unspecified cirrhosis of liver: Secondary | ICD-10-CM

## 2011-08-17 NOTE — Telephone Encounter (Signed)
Labs in EPIC per Dr Christella Hartigan for 2 week f/u

## 2011-08-19 ENCOUNTER — Ambulatory Visit: Payer: Medicaid Other | Attending: Specialist

## 2011-08-25 ENCOUNTER — Other Ambulatory Visit (INDEPENDENT_AMBULATORY_CARE_PROVIDER_SITE_OTHER): Payer: Medicaid Other

## 2011-08-25 ENCOUNTER — Other Ambulatory Visit: Payer: Self-pay | Admitting: Internal Medicine

## 2011-08-25 DIAGNOSIS — K7460 Unspecified cirrhosis of liver: Secondary | ICD-10-CM

## 2011-08-25 LAB — COMPREHENSIVE METABOLIC PANEL
Alkaline Phosphatase: 109 U/L (ref 39–117)
CO2: 27 mEq/L (ref 19–32)
Creatinine, Ser: 0.7 mg/dL (ref 0.4–1.2)
GFR: 126.23 mL/min (ref >60.00)
Glucose, Bld: 86 mg/dL (ref 70–99)
Total Bilirubin: 1.4 mg/dL — ABNORMAL HIGH (ref 0.3–1.2)

## 2011-08-25 LAB — CBC WITH DIFFERENTIAL/PLATELET
Basophils Relative: 0.8 % (ref 0.0–3.0)
Eosinophils Relative: 2.5 % (ref 0.0–5.0)
HCT: 37.2 % (ref 36.0–46.0)
Hemoglobin: 12.4 g/dL (ref 12.0–15.0)
Lymphs Abs: 2.4 10*3/uL (ref 0.7–4.0)
Monocytes Relative: 9.9 % (ref 3.0–12.0)
Neutro Abs: 3.3 10*3/uL (ref 1.4–7.7)
RBC: 3.64 Mil/uL — ABNORMAL LOW (ref 3.87–5.11)
WBC: 6.6 10*3/uL (ref 4.5–10.5)

## 2011-08-26 ENCOUNTER — Ambulatory Visit (HOSPITAL_COMMUNITY): Payer: Medicaid Other

## 2011-08-26 ENCOUNTER — Ambulatory Visit (HOSPITAL_COMMUNITY): Payer: Self-pay

## 2011-08-27 ENCOUNTER — Encounter: Payer: Medicaid Other | Admitting: Internal Medicine

## 2011-08-31 ENCOUNTER — Telehealth: Payer: Self-pay

## 2011-08-31 NOTE — Telephone Encounter (Signed)
Message copied by Donata Duff on Mon Aug 31, 2011  8:13 AM ------      Message from: Donata Duff      Created: Mon Aug 17, 2011  2:35 PM       Pt to get labs

## 2011-08-31 NOTE — Telephone Encounter (Signed)
Pt has been notified that she is due for labs

## 2011-09-01 ENCOUNTER — Telehealth: Payer: Self-pay | Admitting: Gastroenterology

## 2011-09-02 NOTE — Telephone Encounter (Signed)
Pt called to let Dr Christella Hartigan know that her labs have been done.

## 2011-09-17 ENCOUNTER — Emergency Department (HOSPITAL_COMMUNITY)
Admission: EM | Admit: 2011-09-17 | Discharge: 2011-09-17 | Disposition: A | Discharge status: Home / Self Care | Payer: Medicaid Other | Attending: Emergency Medicine | Admitting: Emergency Medicine

## 2011-09-17 ENCOUNTER — Emergency Department (HOSPITAL_COMMUNITY): Payer: Medicaid Other

## 2011-09-17 ENCOUNTER — Encounter (HOSPITAL_COMMUNITY): Payer: Self-pay | Admitting: *Deleted

## 2011-09-17 DIAGNOSIS — Z981 Arthrodesis status: Secondary | ICD-10-CM | POA: Insufficient documentation

## 2011-09-17 DIAGNOSIS — M79609 Pain in unspecified limb: Secondary | ICD-10-CM | POA: Insufficient documentation

## 2011-09-17 DIAGNOSIS — M542 Cervicalgia: Secondary | ICD-10-CM | POA: Insufficient documentation

## 2011-09-17 DIAGNOSIS — I10 Essential (primary) hypertension: Secondary | ICD-10-CM | POA: Insufficient documentation

## 2011-09-17 DIAGNOSIS — S139XXA Sprain of joints and ligaments of unspecified parts of neck, initial encounter: Secondary | ICD-10-CM | POA: Insufficient documentation

## 2011-09-17 DIAGNOSIS — S161XXA Strain of muscle, fascia and tendon at neck level, initial encounter: Secondary | ICD-10-CM

## 2011-09-17 DIAGNOSIS — R51 Headache: Secondary | ICD-10-CM | POA: Insufficient documentation

## 2011-09-17 MED ORDER — ACETAMINOPHEN 325 MG PO TABS
975.0000 mg | ORAL_TABLET | Freq: Once | ORAL | Status: AC
  Administered 2011-09-17: 975 mg via ORAL

## 2011-09-17 MED ORDER — ACETAMINOPHEN 325 MG PO TABS
ORAL_TABLET | ORAL | Status: AC
  Filled 2011-09-17: qty 3
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
## 2011-09-17 NOTE — Discharge Instructions (Signed)
Take Tylenol for mild pain or you're prescribed oxycodone or bad pain. Keep your scheduled appointment with your orthopedist next week

## 2011-09-18 ENCOUNTER — Ambulatory Visit (HOSPITAL_COMMUNITY): Payer: Medicaid Other | Attending: Internal Medicine

## 2011-09-24 ENCOUNTER — Emergency Department (HOSPITAL_COMMUNITY): Payer: Medicaid Other

## 2011-09-24 ENCOUNTER — Encounter (HOSPITAL_COMMUNITY): Payer: Self-pay | Admitting: *Deleted

## 2011-09-24 ENCOUNTER — Emergency Department (HOSPITAL_COMMUNITY)
Admission: EM | Admit: 2011-09-24 | Discharge: 2011-09-24 | Disposition: A | Discharge status: Home / Self Care | Payer: Medicaid Other | Attending: Emergency Medicine | Admitting: Emergency Medicine

## 2011-09-24 ENCOUNTER — Ambulatory Visit: Payer: Medicaid Other | Attending: Specialist | Admitting: Occupational Therapy

## 2011-09-24 DIAGNOSIS — S161XXA Strain of muscle, fascia and tendon at neck level, initial encounter: Secondary | ICD-10-CM

## 2011-09-24 DIAGNOSIS — M25549 Pain in joints of unspecified hand: Secondary | ICD-10-CM | POA: Insufficient documentation

## 2011-09-24 DIAGNOSIS — S139XXA Sprain of joints and ligaments of unspecified parts of neck, initial encounter: Secondary | ICD-10-CM | POA: Insufficient documentation

## 2011-09-24 DIAGNOSIS — M542 Cervicalgia: Secondary | ICD-10-CM | POA: Insufficient documentation

## 2011-09-24 DIAGNOSIS — T1490XA Injury, unspecified, initial encounter: Secondary | ICD-10-CM | POA: Insufficient documentation

## 2011-09-24 DIAGNOSIS — IMO0001 Reserved for inherently not codable concepts without codable children: Secondary | ICD-10-CM | POA: Insufficient documentation

## 2011-09-24 DIAGNOSIS — I10 Essential (primary) hypertension: Secondary | ICD-10-CM | POA: Insufficient documentation

## 2011-09-24 DIAGNOSIS — M25649 Stiffness of unspecified hand, not elsewhere classified: Secondary | ICD-10-CM | POA: Insufficient documentation

## 2011-09-24 MED ORDER — OXYCODONE HCL 5 MG PO TABS: 5.0000 mg | ORAL_TABLET | ORAL | Status: AC | PRN

## 2011-09-24 MED ORDER — ORPHENADRINE CITRATE ER 100 MG PO TB12: 100.0000 mg | ORAL_TABLET | Freq: Two times a day (BID) | ORAL | Status: AC

## 2011-09-24 NOTE — Discharge Instructions (Signed)
Cervical Sprain A cervical sprain is an injury in the neck in which the ligaments are stretched or torn. The ligaments are the tissues that hold the bones of the neck (vertebrae) in place.Cervical sprains can range from very mild to very severe. Most cervical sprains get better in 1 to 3 weeks, but it depends on the cause and extent of the injury. Severe cervical sprains can cause the neck vertebrae to be unstable. This can lead to damage of the spinal cord and can result in serious nervous system problems. Your caregiver will determine whether your cervical sprain is mild or severe. CAUSES  Severe cervical sprains may be caused by:  Contact sport injuries (football, rugby, wrestling, hockey, auto racing, gymnastics, diving, martial arts, boxing).   Motor vehicle collisions.   Whiplash injuries. This means the neck is forcefully whipped backward and forward.   Falls.  Mild cervical sprains may be caused by:   Awkward positions, such as cradling a telephone between your ear and shoulder.   Sitting in a chair that does not offer proper support.   Working at a poorly designed computer station.   Activities that require looking up or down for long periods of time.  SYMPTOMS   Pain, soreness, stiffness, or a burning sensation in the front, back, or sides of the neck. This discomfort may develop immediately after injury or it may develop slowly and not begin for 24 hours or more after an injury.   Pain or tenderness directly in the middle of the back of the neck.   Shoulder or upper back pain.   Limited ability to move the neck.   Headache.   Dizziness.   Weakness, numbness, or tingling in the hands or arms.   Muscle spasms.   Difficulty swallowing or chewing.   Tenderness and swelling of the neck.  DIAGNOSIS  Most of the time, your caregiver can diagnose this problem by taking your history and doing a physical exam. Your caregiver will ask about any known problems, such as  arthritis in the neck or a previous neck injury. X-rays may be taken to find out if there are any other problems, such as problems with the bones of the neck. However, an X-ray often does not reveal the full extent of a cervical sprain. Other tests such as a computed tomography (CT) scan or magnetic resonance imaging (MRI) may be needed. TREATMENT  Treatment depends on the severity of the cervical sprain. Mild sprains can be treated with rest, keeping the neck in place (immobilization), and pain medicines. Severe cervical sprains need immediate immobilization and an appointment with an orthopedist or neurosurgeon. Several treatment options are available to help with pain, muscle spasms, and other symptoms. Your caregiver may prescribe:  Medicines, such as pain relievers, numbing medicines, or muscle relaxants.   Physical therapy. This can include stretching exercises, strengthening exercises, and posture training. Exercises and improved posture can help stabilize the neck, strengthen muscles, and help stop symptoms from returning.   A neck collar to be worn for short periods of time. Often, these collars are worn for comfort. However, certain collars may be worn to protect the neck and prevent further worsening of a serious cervical sprain.  HOME CARE INSTRUCTIONS   Put ice on the injured area.   Put ice in a plastic bag.   Place a towel between your skin and the bag.   Leave the ice on for 15 to 20 minutes, 3 to 4 times a day.     Only take over-the-counter or prescription medicines for pain, discomfort, or fever as directed by your caregiver.   Keep all follow-up appointments as directed by your caregiver.   Keep all physical therapy appointments as directed by your caregiver.   If a neck collar is prescribed, wear it as directed by your caregiver.   Do not drive while wearing a neck collar.   Make any needed adjustments to your work station to promote good posture.   Avoid positions  and activities that make your symptoms worse.   Warm up and stretch before being active to help prevent problems.  SEEK MEDICAL CARE IF:   Your pain is not controlled with medicine.   You are unable to decrease your pain medicine over time as planned.   Your activity level is not improving as expected.  SEEK IMMEDIATE MEDICAL CARE IF:   You develop any bleeding, stomach upset, or signs of an allergic reaction to your medicine.   Your symptoms get worse.   You develop new, unexplained symptoms.   You have numbness, tingling, weakness, or paralysis in any part of your body.  MAKE SURE YOU:   Understand these instructions.   Will watch your condition.   Will get help right away if you are not doing well or get worse.  Document Released: 02/08/2007 Document Revised: 04/02/2011 Document Reviewed: 01/14/2011 [facility] Patient Information 2012 [place], [place].  Orphenadrine tablets What is this medicine? ORPHENADRINE (or FEN a dreen) helps to relieve pain and stiffness in muscles and can treat muscle spasms. This medicine may be used for other purposes; ask your health care provider or pharmacist if you have questions. What should I tell my health care provider before I take this medicine? They need to know if you have any of these conditions: -glaucoma -heart disease -kidney disease -myasthenia gravis -peptic ulcer disease -prostate disease -stomach problems -an unusual or allergic reaction to orphenadrine, other medicines, foods, lactose, dyes, or preservatives -pregnant or trying to get pregnant -breast-feeding How should I use this medicine? Take this medicine by mouth with a full glass of water. Follow the directions on the prescription label. Take your medicine at regular intervals. Do not take your medicine more often than directed. Do not take more than you are told to take. Talk to your pediatrician regarding the use of this medicine in children. Special care may be  needed. Patients over 67 years old may have a stronger reaction and need a smaller dose. Overdosage: If you think you have taken too much of this medicine contact a poison control center or emergency room at once. NOTE: This medicine is only for you. Do not share this medicine with others. What if I miss a dose? If you miss a dose, take it as soon as you can. If it is almost time for your next dose, take only that dose. Do not take double or extra doses. What may interact with this medicine? -alcohol -antihistamines -barbiturates, like phenobarbital -benzodiazepines -cyclobenzaprine -medicines for pain -phenothiazines like chlorpromazine, mesoridazine, prochlorperazine, thioridazine This list may not describe all possible interactions. Give your health care provider a list of all the medicines, herbs, non-prescription drugs, or dietary supplements you use. Also tell them if you smoke, drink alcohol, or use illegal drugs. Some items may interact with your medicine. What should I watch for while using this medicine? Your mouth may get dry. Chewing sugarless gum or sucking hard candy, and drinking plenty of water may help. Contact your doctor if the problem  does not go away or is severe. This medicine may cause dry eyes and blurred vision. If you wear contact lenses you may feel some discomfort. Lubricating drops may help. See your eye doctor if the problem does not go away or is severe. You may get drowsy or dizzy. Do not drive, use machinery, or do anything that needs mental alertness until you know how this medicine affects you. Do not stand or sit up quickly, especially if you are an older patient. This reduces the risk of dizzy or fainting spells. Alcohol may interfere with the effect of this medicine. Avoid alcoholic drinks. What side effects may I notice from receiving this medicine? Side effects that you should report to your doctor or health care professional as soon as possible: -allergic  reactions like skin rash, itching or hives, swelling of the face, lips, or tongue -changes in vision -difficulty breathing -fast heartbeat or palpitations -hallucinations -light headedness, fainting spells -vomiting Side effects that usually do not require medical attention (report to your doctor or health care professional if they continue or are bothersome): -dizziness -drowsiness -headache -nausea This list may not describe all possible side effects. Call your doctor for medical advice about side effects. You may report side effects to FDA at 1-800-FDA-1088. Where should I keep my medicine? Keep out of the reach of children. Store at room temperature between 15 and 30 degrees C (59 and 86 degrees F). Protect from light. Keep container tightly closed. Throw away any unused medicine after the expiration date. NOTE: This sheet is a summary. It may not cover all possible information. If you have questions about this medicine, talk to your doctor, pharmacist, or health care provider.  2012, [employer]vier/[employer] Standard. (11/08/2007 5:19:12 PM)  Oxycodone tablets or capsules What is this medicine? OXYCODONE (ox i KOE done) is a pain reliever. It is used to treat moderate to severe pain. This medicine may be used for other purposes; ask your health care provider or pharmacist if you have questions. What should I tell my health care provider before I take this medicine? They need to know if you have any of these conditions: -Addison's disease -brain tumor -drug abuse or addiction -head injury -heart disease -if you frequently drink alcohol containing drinks -kidney disease or problems going to the bathroom -liver disease -lung disease, asthma, or breathing problems -mental problems -an unusual or allergic reaction to oxycodone, codeine, hydrocodone, morphine, other medicines, foods, dyes, or preservatives -pregnant or trying to get pregnant -breast-feeding How should I use this  medicine? Take this medicine by mouth with a glass of water. Follow the directions on the prescription label. You can take it with or without food. If it upsets your stomach, take it with food. Take your medicine at regular intervals. Do not take it more often than directed. Do not stop taking except on your doctor's advice. Some brands of this medicine have special instructions. Ask your doctor or pharmacist if these directions are for you: Do not cut, crush or chew this medicine. Swallow only one tablet at a time. Do not wet, soak, or lick the tablet before you take it. Talk to your pediatrician regarding the use of this medicine in children. Special care may be needed. Overdosage: If you think you have taken too much of this medicine contact a poison control center or emergency room at once. NOTE: This medicine is only for you. Do not share this medicine with others. What if I miss a dose? If you miss a  dose, take it as soon as you can. If it is almost time for your next dose, take only that dose. Do not take double or extra doses. What may interact with this medicine? -alcohol -antihistamines -certain medicines used for nausea like chlorpromazine, droperidol -erythromycin -ketoconazole -medicines for depression, anxiety, or psychotic disturbances -medicines for pain including pentazocine, buprenorphine, butorphanol, nalbuphine, tramadol, and propoxyphene -medicines for sleep -muscle relaxants -naloxone -naltrexone -narcotic medicines for pain -nilotinib -phenobarbital -phenytoin -rifampin -ritonavir -voriconazole This list may not describe all possible interactions. Give your health care provider a list of all the medicines, herbs, non-prescription drugs, or dietary supplements you use. Also tell them if you smoke, drink alcohol, or use illegal drugs. Some items may interact with your medicine. What should I watch for while using this medicine? Tell your doctor or health care  professional if your pain does not go away, if it gets worse, or if you have new or a different type of pain. You may develop tolerance to the medicine. Tolerance means that you will need a higher dose of the medicine for pain relief. Tolerance is normal and is expected if you take this medicine for a long time. Do not suddenly stop taking your medicine because you may develop a severe reaction. Your body becomes used to the medicine. This does NOT mean you are addicted. Addiction is a behavior related to getting and using a drug for a non-medical reason. If you have pain, you have a medical reason to take pain medicine. Your doctor will tell you how much medicine to take. If your doctor wants you to stop the medicine, the dose will be slowly lowered over time to avoid any side effects. You may get drowsy or dizzy when you first start taking this medicine or change doses. Do not drive, use machinery, or do anything that may be dangerous until you know how the medicine affects you. Stand or sit up slowly. This medicine will cause constipation. Try to have a bowel movement at least every 2 to 3 days. If you do not have a bowel movement for 3 days, call your doctor or health care professional. Your mouth may get dry. Drinking water, chewing sugarless gum, or sucking on hard candy may help. See your dentist every 6 months. What side effects may I notice from receiving this medicine? Side effects that you should report to your doctor or health care professional as soon as possible: -allergic reactions like skin rash, itching or hives, swelling of the face, lips, or tongue -breathing problems -confusion -feeling faint or lightheaded, falls -trouble passing urine or change in the amount of urine -unusually weak or tired Side effects that usually do not require medical attention (report to your doctor or health care professional if they continue or are bothersome): -constipation -dry mouth -itching -nausea,  vomiting -upset stomach This list may not describe all possible side effects. Call your doctor for medical advice about side effects. You may report side effects to FDA at 1-800-FDA-1088. Where should I keep my medicine? Keep out of the reach of children. This medicine can be abused. Keep your medicine in a safe place to protect it from theft. Do not share this medicine with anyone. Selling or giving away this medicine is dangerous and against the law. Store at room temperature between 15 and 30 degrees C (59 and 86 degrees F). Protect from light. Keep container tightly closed. Throw away any unused medicine after the expiration date. NOTE: This sheet is a summary.  It may not cover all possible information. If you have questions about this medicine, talk to your doctor, pharmacist, or health care provider.  2012, [employer] Standard. (10/16/2009 5:21:34 PM)
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
## 2011-09-29 ENCOUNTER — Other Ambulatory Visit: Payer: Self-pay | Admitting: Internal Medicine

## 2011-10-01 ENCOUNTER — Encounter: Payer: Medicaid Other | Admitting: Occupational Therapy

## 2011-10-07 ENCOUNTER — Ambulatory Visit: Payer: Medicaid Other | Attending: Specialist | Admitting: Occupational Therapy

## 2011-10-07 DIAGNOSIS — IMO0001 Reserved for inherently not codable concepts without codable children: Secondary | ICD-10-CM | POA: Insufficient documentation

## 2011-10-07 DIAGNOSIS — M25549 Pain in joints of unspecified hand: Secondary | ICD-10-CM | POA: Insufficient documentation

## 2011-10-07 DIAGNOSIS — M25649 Stiffness of unspecified hand, not elsewhere classified: Secondary | ICD-10-CM | POA: Insufficient documentation

## 2011-10-21 DIAGNOSIS — IMO0002 Reserved for concepts with insufficient information to code with codable children: Secondary | ICD-10-CM | POA: Insufficient documentation

## 2011-10-22 DIAGNOSIS — Z9225 Personal history of immunosupression therapy: Secondary | ICD-10-CM | POA: Insufficient documentation

## 2011-10-27 ENCOUNTER — Encounter: Payer: Medicaid Other | Admitting: Occupational Therapy

## 2011-11-02 ENCOUNTER — Telehealth: Payer: Self-pay | Admitting: *Deleted

## 2011-11-02 DIAGNOSIS — M79643 Pain in unspecified hand: Secondary | ICD-10-CM

## 2011-11-02 NOTE — Telephone Encounter (Signed)
I'm not sure what this is about.  I would suggest getting copies of office notes from Dr. Amanda Pea for review; patient may need an [facility] appointment.

## 2011-11-02 NOTE — Telephone Encounter (Signed)
Pt called - needs a new referral to Dr Amanda Pea for hands due to Medicaid. In Epic had appt 06/08/11 regarding hands and now it is time to sch FU appt.

## 2011-11-03 NOTE — Telephone Encounter (Signed)
Please schedule for appt 

## 2011-11-06 ENCOUNTER — Other Ambulatory Visit: Payer: Self-pay | Admitting: Internal Medicine

## 2011-11-06 NOTE — Telephone Encounter (Signed)
Will defer to patient's PCP; I don't see a vitamin D level.

## 2011-11-19 NOTE — Telephone Encounter (Signed)
Info and referral faxed to Dr Amanda Pea and their office will call pt with appt.

## 2011-11-25 ENCOUNTER — Ambulatory Visit (INDEPENDENT_AMBULATORY_CARE_PROVIDER_SITE_OTHER): Payer: Medicaid Other | Admitting: Internal Medicine

## 2011-11-25 VITALS — BP 129/90 | HR 80 | Temp 97.6°F | Ht 61.5 in | Wt 191.1 lb

## 2011-11-25 DIAGNOSIS — M79606 Pain in leg, unspecified: Secondary | ICD-10-CM

## 2011-11-25 DIAGNOSIS — I10 Essential (primary) hypertension: Secondary | ICD-10-CM

## 2011-11-25 DIAGNOSIS — R109 Unspecified abdominal pain: Secondary | ICD-10-CM

## 2011-11-25 DIAGNOSIS — K754 Autoimmune hepatitis: Secondary | ICD-10-CM

## 2011-11-25 DIAGNOSIS — M79609 Pain in unspecified limb: Secondary | ICD-10-CM

## 2011-11-25 DIAGNOSIS — K7460 Unspecified cirrhosis of liver: Secondary | ICD-10-CM

## 2011-11-25 DIAGNOSIS — D7589 Other specified diseases of blood and blood-forming organs: Secondary | ICD-10-CM

## 2011-11-25 DIAGNOSIS — Z23 Encounter for immunization: Secondary | ICD-10-CM

## 2011-11-25 DIAGNOSIS — K649 Unspecified hemorrhoids: Secondary | ICD-10-CM

## 2011-11-25 DIAGNOSIS — E559 Vitamin D deficiency, unspecified: Secondary | ICD-10-CM

## 2011-11-25 DIAGNOSIS — E039 Hypothyroidism, unspecified: Secondary | ICD-10-CM

## 2011-11-25 DIAGNOSIS — Z0000 Encounter for general adult medical examination without abnormal findings: Secondary | ICD-10-CM

## 2011-11-25 DIAGNOSIS — F17200 Nicotine dependence, unspecified, uncomplicated: Secondary | ICD-10-CM

## 2011-11-25 DIAGNOSIS — E063 Autoimmune thyroiditis: Secondary | ICD-10-CM

## 2011-11-25 DIAGNOSIS — Z720 Tobacco use: Secondary | ICD-10-CM

## 2011-11-25 LAB — COMPREHENSIVE METABOLIC PANEL
BUN: 12 mg/dL (ref 6–23)
CO2: 29 mEq/L (ref 19–32)
Creat: 0.87 mg/dL (ref 0.50–1.10)
Glucose, Bld: 91 mg/dL (ref 70–99)
Sodium: 141 mEq/L (ref 135–145)
Total Bilirubin: 1.4 mg/dL — ABNORMAL HIGH (ref 0.3–1.2)
Total Protein: 7.8 g/dL (ref 6.0–8.3)

## 2011-11-25 LAB — LIPID PANEL
Cholesterol: 194 mg/dL (ref 0–200)
HDL: 63 mg/dL (ref >39)
Triglycerides: 124 mg/dL (ref <150)
VLDL: 25 mg/dL (ref 0–40)

## 2011-11-25 MED ORDER — ACETAMINOPHEN 325 MG PO TABS: 325.0000 mg | ORAL_TABLET | Freq: Four times a day (QID) | ORAL | Status: DC | PRN

## 2011-11-25 NOTE — Progress Notes (Signed)
Subjective:    Patient ID: Alexandria White   Gender: female   DOB: Jan 04, 1966   Age: 46 y.o.   MRN: [id number]  HPI: Alexandria White is a 46 y.o. 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 Currently taking clonidine (Catapres), hydrochlorothiazide (HCTZ) and lisinopril (Prinivil). Patient misses doses 0 x per week on average. denies headaches, dizziness, lightheadedness, chest pain, shortness of breath.  does request refills today.  2) Abdominal pain - Patient describes a 2-3 years history of gradually worsening - worse over last 2-3 months, intermittent stabbing and cramping periumbilical pain that does not radiate and can occasionally can last for hours at a time, with occasional brash sensation, worse with laying flat at night after meals. Having bowel movements daily but sometimes strains to go. Aggravating factors include: none. Alleviating factors include: bowel movements. Associated symptoms include: nausea. The patient denies chills, diarrhea, dysuria, fever and vomiting, no blood in stools. Notably, she says that her prednisone was decreased from 8mg  daily to 4mg  daily a few months earlier, by her GI physician at [facility]. Also, she has noticed worsening yellow-discoloration of her eyes.   3) Leg / feet pain - Patient indicates recent increased bilateral lower extremity pain, symmetrical, tightness below the knees. Denies specific localized calf pain with ambulation. No swelling, numbness, paresthesias, warmth, erythema, no joint pain. No fevers, chills ago. No recent falls, trauma, MVAs, no history of injury to this area.   Review of Systems: Per HPI.    Current Outpatient Medications: Medication Sig  . cloNIDine (CATAPRES) 0.1 MG tablet Take 0.2 mg by mouth at bedtime.  . CVS VITAMIN D3 1000 UNITS capsule  TAKE 1 CAPSULE (1,000 UNITS TOTAL) BY MOUTH DAILY.  . hydrochlorothiazide (HYDRODIURIL) 25 MG tablet Take 25 mg by mouth daily.  Marland Kitchen lisinopril (PRINIVIL,ZESTRIL) 40 MG tablet TAKE 1 TABLET BY MOUTH ONCE A DAY  . loratadine (CLARITIN) 10 MG tablet Take 10 mg by mouth daily as needed. allergies  . mycophenolate (CELLCEPT) 250 MG capsule Take 1,000 mg by mouth 2 (two) times daily.   Marland Kitchen omeprazole (PRILOSEC) 40 MG capsule Take 40 mg by mouth 2 (two) times daily before a meal.  . predniSONE (DELTASONE) 1 MG tablet Take 4 mg by mouth daily.   Marland Kitchen topiramate (TOPAMAX) 100 MG tablet Take 100 mg by mouth 2 (two) times daily as needed. For headaches     Allergies  Allergen Reactions  . Aspirin     Stomach pain  . Latex Rash    Past Medical History  Diagnosis Date  . Cystic acne   . 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 // AFP 15 (03/2009)  // Was changed from azathioprine bc unable to achieve remission --> now chronically on Cellcept and prednisone since May 2011// Followed by Dr . Christella Hartigan ([facility]), and now with Dr. Larey Dresser ([facility])  . 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 Perimenopausal   . Macrocytosis without anemia     BL MCV 102-111. Unclear cause. Possibly due to hypothyroidism vs liver disease (although no  clear history of alcohol abuse)  // RBC folate and B12 are within normal limits  . Tobacco abuse   . HTN (hypertension)   . GERD (gastroesophageal reflux disease)     EGD (10/2010) - showing mild gastritis, bx negative - by Dr. Christella Hartigan  . Headache     tension with features of migraine  . History of rectal bleeding     2/2 hemorrhoids, colonoscopy 2008 --> next due 2018  . Hemorrhoids     Small internal and external hemorrhoids noted on colonoscopy (05/2006) - thought  to be the cause of the rectal bleeding that she noted.     Past Surgical History  Procedure Date  . Cholecystectomy 1992    laparoscopically   . Ercp   . Colonoscopy   . Tubal ligation     fibroid tumor removed from left fallopian tube     Objective:    Physical Exam: Filed Vitals:   11/25/11 1106  BP: 129/90  Pulse: 80  Temp: 97.6 F (36.4 C)     General: Vital signs reviewed and noted. Well-developed, well-nourished, in no acute distress; alert, appropriate and cooperative throughout examination.  Head: Normocephalic, atraumatic.  Eyes: Conjunctivae/corneas mildly icteric. PERRL, EOM's intact..  Ears: TM nonerythematous, not bulging, good light reflex bilaterally.  Nose: Mucous membranes moist, not inflammed, nonerythematous.  Throat: Oropharynx nonerythematous, no exudate appreciated.   Neck: No deformities, masses, or tenderness noted.  Lungs:  Normal respiratory effort. Clear to auscultation BL without crackles or wheezes.  Heart: RRR. S1 and S2 normal without gallop, rubs.   Abdomen:  BS normoactive. Soft, Nondistended, epigastric TTP. No masses or organomegaly.  Extremities: No pretibial edema. No swelling, dis-coloraiton, open lesions, warmth or redness of lower extremities. DP/PT pulses strong and equal bilaterally. Negative Homan sign bilaterally.     Assessment/ Plan:   Case and plan of care discussed with Dr. Doneen Poisson.

## 2011-11-25 NOTE — Patient Instructions (Addendum)
Please follow-up at the clinic in 1 month, at which time we will reevaluate your back pain, leg pain, abdominal pain - OR, please follow-up in the clinic sooner if needed.  There have been changes in your medications:  Please start over the counter tylenol as needed for your leg pain (do not use more than 2 grams daily) and always use as little as possible.  Please read below for tips on GERD diet (to help with your abdominal pain)  Please read below about back exercises to help with your back / leg pain  You are getting labs today, if they are abnormal I will give you a call.   If you have been started on new medication(s), and you develop symptoms concerning for allergic reaction, including, but not limited to, throat closing, tongue swelling, rash, please stop the medication immediately and call the clinic at [phone], and go to the ER.  If symptoms worsen, or new symptoms arise, please call the clinic or go to the ER.  Please bring all of your medications in a bag to your next visit.    Diet for GERD or PUD   Nutrition therapy can help ease the discomfort of gastroesophageal reflux disease (GERD) and peptic ulcer disease (PUD).   HOME CARE INSTRUCTIONS:  Eat your meals slowly, in a relaxed setting.  Eat 5 to 6 small meals per day.  If a food causes distress, stop eating it for a period of time.   FOODS TO AVOID:  Coffee, regular or decaffeinated.  Cola beverages, regular or low calorie.  Tea, regular or decaffeinated.  Pepper.  Cocoa.  High fat foods including meats.  Butter, margarine, hydrogenated oil (trans fats).  Peppermint or spearmint (if you have GERD).  Fruits and vegetables as tolerated.  Alcoholic beverages.  Nicotine (smoking or chewing). This is one of the most potent stimulants to acid production in the gastrointestinal tract.  Any food that seems to aggravate your condition.    If you have questions regarding your diet, call your caregiver's office or a  registered dietitian.   OTHER TIPS IF YOU HAVE GERD:  Lying flat may make symptoms worse. Keep the head of your bed raised 6 to 9 inches by using a foam wedge or blocks under the legs of the bed.  Do not lay down until 3 hours after eating a meal.  Daily physical activity may help reduce symptoms.   MAKE SURE YOU:  Understand these instructions.  Will watch your condition.  Will get help right away if you are not doing well or get worse.   Document Released: 04/13/2005 Document Re-Released: 08/30/2008  [facility] Patient Information 2011 [place], [place].  Back Exercises Back exercises help treat and prevent back injuries. The goal of back exercises is to increase the strength of your abdominal and back muscles and the flexibility of your back. These exercises should be started when you no longer have back pain. Back exercises include:  Pelvic Tilt. Lie on your back with your knees bent. Tilt your pelvis until the lower part of your back is against the floor. Hold this position 5 to 10 sec and repeat 5 to 10 times.   Knee to Chest. Pull first 1 knee up against your chest and hold for 20 to 30 seconds, repeat this with the other knee, and then both knees. This may be done with the other leg straight or bent, whichever feels better.   Sit-Ups or Curl-Ups. Bend your knees 90 degrees.  Start with tilting your pelvis, and do a partial, slow sit-up, lifting your trunk only 30 to 45 degrees off the floor. Take at least 2 to 3 seconds for each sit-up. Do not do sit-ups with your knees out straight. If partial sit-ups are difficult, simply do the above but with only tightening your abdominal muscles and holding it as directed.   Hip-Lift. Lie on your back with your knees flexed 90 degrees. Push down with your feet and shoulders as you raise your hips a couple inches off the floor; hold for 10 seconds, repeat 5 to 10 times.   Back arches. Lie on your stomach, propping yourself up on bent elbows. Slowly  press on your hands, causing an arch in your low back. Repeat 3 to 5 times. Any initial stiffness and discomfort should lessen with repetition over time.   Shoulder-Lifts. Lie face down with arms beside your body. Keep hips and torso pressed to floor as you slowly lift your head and shoulders off the floor.  Do not overdo your exercises, especially in the beginning. Exercises may cause you some mild back discomfort which lasts for a few minutes; however, if the pain is more severe, or lasts for more than 15 minutes, do not continue exercises until you see your caregiver. Improvement with exercise therapy for back problems is slow.   See your caregivers for assistance with developing a proper back exercise program. Document Released: 05/21/2004 Document Revised: 04/02/2011 Document Reviewed: 04/13/2005 [facility] Patient Information 2012 [place], [place].

## 2011-11-26 ENCOUNTER — Encounter: Payer: Medicaid Other | Admitting: Internal Medicine

## 2011-11-26 ENCOUNTER — Telehealth: Payer: Self-pay | Admitting: *Deleted

## 2011-11-26 LAB — VITAMIN D 25 HYDROXY (VIT D DEFICIENCY, FRACTURES): Vit D, 25-Hydroxy: 35 ng/mL (ref 30–89)

## 2011-11-26 NOTE — Telephone Encounter (Signed)
Message copied by Rocco Pauls on Thu Nov 26, 2011 11:59 AM ------      Message from: Priscella Mann      Created: Thu Nov 26, 2011  9:05 AM       Please call pt in for an appt today at 10:45AM. She had just seen me yesterday, however, her liver function tests were higher than last values that we have. I would like to get additional labs and possibly imaging (after discussion with the pt).            I have called and left a message to call me (without the above mentioned details), however, have been unsuccessful in reaching the patient.            Thank you!             - Johnette Abraham, D.O., 11/26/2011, 9:06 AM

## 2011-11-26 NOTE — Telephone Encounter (Signed)
Would you like to see pt tomorrow, i will be in touch with her first thing tomorrow morning

## 2011-11-27 ENCOUNTER — Ambulatory Visit (INDEPENDENT_AMBULATORY_CARE_PROVIDER_SITE_OTHER): Payer: Medicaid Other | Admitting: Internal Medicine

## 2011-11-27 ENCOUNTER — Encounter: Payer: Self-pay | Admitting: Internal Medicine

## 2011-11-27 VITALS — BP 133/89 | HR 90 | Temp 97.1°F | Ht 61.5 in | Wt 191.4 lb

## 2011-11-27 DIAGNOSIS — D7589 Other specified diseases of blood and blood-forming organs: Secondary | ICD-10-CM | POA: Insufficient documentation

## 2011-11-27 DIAGNOSIS — Z23 Encounter for immunization: Secondary | ICD-10-CM

## 2011-11-27 DIAGNOSIS — Z720 Tobacco use: Secondary | ICD-10-CM | POA: Insufficient documentation

## 2011-11-27 DIAGNOSIS — K649 Unspecified hemorrhoids: Secondary | ICD-10-CM | POA: Insufficient documentation

## 2011-11-27 DIAGNOSIS — K754 Autoimmune hepatitis: Secondary | ICD-10-CM | POA: Insufficient documentation

## 2011-11-27 DIAGNOSIS — M79606 Pain in leg, unspecified: Secondary | ICD-10-CM | POA: Insufficient documentation

## 2011-11-27 DIAGNOSIS — R109 Unspecified abdominal pain: Secondary | ICD-10-CM | POA: Insufficient documentation

## 2011-11-27 DIAGNOSIS — I10 Essential (primary) hypertension: Secondary | ICD-10-CM

## 2011-11-27 MED ORDER — LISINOPRIL 40 MG PO TABS: 40.0000 mg | ORAL_TABLET | Freq: Every day | ORAL | Status: DC

## 2011-11-27 MED ORDER — HYDROCHLOROTHIAZIDE 25 MG PO TABS: 25.0000 mg | ORAL_TABLET | Freq: Every day | ORAL | Status: DC

## 2011-11-27 MED ORDER — PREDNISONE 10 MG PO TABS: 10.0000 mg | ORAL_TABLET | Freq: Every day | ORAL | Status: AC

## 2011-11-27 NOTE — Assessment & Plan Note (Signed)
Assessment: No longer having the significant fatigue as with prior.  Plan:      Check TSH/ free T4

## 2011-11-27 NOTE — Patient Instructions (Signed)
-  Please increase your Prednisone to 10mg  daily  -Return in 1 week so we may recheck your liver enzymes.  -Be sure to avoid tylenol, as this medication is processed by your liver.  Please be sure to bring all of your medications with you to every visit.  Should you have any new or worsening symptoms, please be sure to call the clinic at [phone].

## 2011-11-27 NOTE — Assessment & Plan Note (Signed)
TdaP given today. 

## 2011-11-27 NOTE — Assessment & Plan Note (Signed)
Health Maintenance  Topic Date Due  . Tetanus/tdap  09/14/1984  . Influenza Vaccine  01/26/2012  . Pap Smear  09/10/2013    Assessment:  Due for Tdap  Plan:  Tdap next visit.

## 2011-11-27 NOTE — Assessment & Plan Note (Addendum)
Liver enzymes elevated   Ref. Range 08/25/2011 10:57 11/25/2011 11:56  Alkaline Phosphatase Latest Range: 39-117 U/L 109 194 (H)  Albumin Latest Range: 3.5-5.2 g/dL 3.5 4.0  AST Latest Range: 0-37 U/L 56 (H) 173 (H)  ALT Latest Range: 0-35 U/L 56 (H) 152 (H)  Total Protein Latest Range: 6.0-8.3 g/dL 6.9 7.8  Total Bilirubin Latest Range: 0.3-1.2 mg/dL 1.4 (H) 1.4 (H)   Apparently patient was supposed to have increased prednisone dose to 10 mg daily, but she had decreased dose to 4 mg daily. Abdominal pain is likely a flare brought him in hepatitis. We will refill prednisone 10 mg daily for 30 days. Patient to return in one week for repeat blood work. Also, immunization records from [facility] been requested, if she has not been completely immunized against hepatitis A & B, we will need to vaccinate her at the followup appointment. She is also been instructed to discontinue Tylenol, though she tells me that she has not taken this medication anyway.

## 2011-11-27 NOTE — Assessment & Plan Note (Signed)
1. The patient was counseled on the dangers of tobacco use, and was advised to quit, referred to a tobacco cessation program and reluctant to quit.   2. Reviewed strategies to maximize success, including:  Removing cigarettes and smoking materials from environment  Stress management  Substitution of other forms of reinforcement Support of family/friends.  Selecting a quit date.  Patient provided contact information for 1-800-QUIT-NOW    

## 2011-11-27 NOTE — Assessment & Plan Note (Addendum)
Assessment: The cause of the patient's abdominal pain is not completely clear. Given her description of brash sensations, worse with laying flat after meal, perumbilical/ epigatric pain - simple exacerbation of her GERD could be occuring. However, she is already on PPI therapy that she states she is compliant with. We therefore discussed lifestyle modification techniques that she can use towards improved control. Alternatively, also considered is possible exacerbation of her hepatitis (which could potentially lead to inflammation with resultant irritation to the capsule - contributing towards pain, nausea, pt reported worsening scleral icterus)  given that she recently has had reduction of her prednisone dosage from prior 8mg  to 4mg  daily per her hepatologist at [facility]. Otherwise, the patient is not having fevers, vomiting, or other signs concerning for sclerosing cholangitis. She additionally is statu-post cholecystectomy (for unclear reasons). She further denies any dysuria, change in frequency of urination to suggest a UTI. Lastly, although she has mild constipation, she is not having significant straining, blood in stools, diarrhea - therefore, gastroenteritis less likely.   Plan:      We discussed GERD lifestyle modification techniques, and handout provided.  She was reeducated on appropriate use of PPI.  Will get CMET today - to evaluate her liver function tests  Continue current hepatitis regimen as prescribed by her hepatologist.   ADDENDUM TO ABOVE AFTER LAB RESULTS: Pertinent labs:  Comprehensive Metabolic Panel:    Component Value Date/Time   NA 141 11/25/2011 1156   K 3.7 11/25/2011 1156   CL 100 11/25/2011 1156   CO2 29 11/25/2011 1156   BUN 12 11/25/2011 1156   CREATININE 0.87 11/25/2011 1156   CREATININE 0.7 08/25/2011 1057   GLUCOSE 91 11/25/2011 1156   GLUCOSE 96 05/10/2006 0936   CALCIUM 9.9 11/25/2011 1156   AST 173* 11/25/2011 1156   ALT 152* 11/25/2011 1156   ALKPHOS 194*  11/25/2011 1156   BILITOT 1.4* 11/25/2011 1156   PROT 7.8 11/25/2011 1156   ALBUMIN 4.0 11/25/2011 1156    Assessment: I discussed these results with Dr. Josem Kaufmann. Given worsening of her hepatic function, will need to further investigate this hepatic pathology. I called the patient's hepatology clinic at [facility] and smoke with Dr. Lucile Shutters nurse, Darel Hong. She indicated that the patient was decreased to prednisone 7mg  daily in 08/2011 in an attempt to start to wean her previously high dose steroids. The patient then no-showed to an appt in 06/18, and was rescheduled for an appt on 06/28. At this follow-up appt, patient was noted to have worsening liver function tests (slightly below those evidenced on our visit together), at which time the patient was instructed to increase her dosage to 10mg  prednisone daily. Somehow the patient seems to be confused about the doses, because the patient insisted to me that she was supposed to be reducing her prednisone to 4mg  daily. Therefore, the patient effectively decreased her prednisone from 8mg  --> 4mg  instead of increasing it to 10mg  daily. This likely accounts for why she is having worsening liver function tests.  Plan:      Increase prednisone to 10mg  daily - as per recommendation of [facility] hepatology clinic.  Dr. Michaelle Copas nurse, Darel Hong, will call the patient to inform her of this change.  Darel Hong is faxing over the last clinic note and she is kindly going to review their records for if hepatitis panel has been recently checked and if hepatitis A/B vaccinations have been provided.  Check hepatic function panel in 1 week, if per [facility] record review, hepatitis  panel not checked recently --> check hepatitis panel too  If LFTs continue to worsen --> inform [facility] hepatology clinic, Dr. Michaelle Copas nurse Darel Hong (clinic number is [phone], and they will contact the patient and adjust her prednisone as needed.

## 2011-11-27 NOTE — Assessment & Plan Note (Signed)
Assessment: Unclear etiology, possibly secondary to tight musculature. She has no indication of pretibial edema, lesions, warmth, redness. No asymmetrical swelling.  Plan:      Leg stretching exercises.  Initially she was recommended to trial tylenol (no more than 2g daily) --> however, after receipt of labs, she was instructed to stop tylenol.

## 2011-11-27 NOTE — Assessment & Plan Note (Signed)
Pertinent Data: BP Readings from Last 3 Encounters:  11/25/11 129/90  09/24/11 151/92  09/17/11 151/94    Basic Metabolic Panel:    Component Value Date/Time   NA 141 11/25/2011 1156   K 3.7 11/25/2011 1156   CL 100 11/25/2011 1156   CO2 29 11/25/2011 1156   BUN 12 11/25/2011 1156   CREATININE 0.87 11/25/2011 1156   CREATININE 0.7 08/25/2011 1057   GLUCOSE 91 11/25/2011 1156   GLUCOSE 96 05/10/2006 0936   CALCIUM 9.9 11/25/2011 1156    Assessment: Disease Control: controlled  Progress toward goals: at goal  Barriers to meeting goals: no barriers identified    Prior history of medication noncompliance, but states that she is now regularly taking her medications.     Plan:  continue current medications  Check CMET today.

## 2011-11-27 NOTE — Progress Notes (Signed)
  Subjective:   Patient ID: Alexandria White female   DOB: 10-15-1965 46 y.o.   MRN: [id number]  HPI: [name] is a 46 y.o. with history of autoimmune hepatitis, who presents today after being notified of abnormal lab results yesterday. She was seen by her PCP on 11/25/2011 with complaints of abdominal pain and nausea. At that time off work was done, and on that liver enzymes were elevated.  Patient reports that she was up all night worrying about laparoscope, but no significant abdominal pain nausea or vomiting. This morning she was able to tolerate a [place] sandwich, after which he felt a little nauseated, but felt better after drinking some water. She denies abdominal pain at this time. No vomiting today.  I spoke with Dr. Thad Ranger, who reports the patient was supposed to have increased prednisone dose to 10 mg daily per her physician at [facility], but instead she decrease dose to 4 mg daily.  Review of Systems: General: no fevers, chills, changes in weight Skin: no rash HEENT: no blurry vision, hearing changes, sore throat Pulm: no dyspnea, coughing, wheezing CV: no chest pain, palpitations, shortness of breath GU: no dysuria, hematuria, polyuria Neuro: no weakness, numbness, or tingling   Objective:  Physical Exam: Filed Vitals:   11/27/11 1133  BP: 133/89  Pulse: 90  Temp: 97.1 F (36.2 C)  TempSrc: Oral  Height: 5' 1.5" (1.562 m)  Weight: 191 lb 6.4 oz (86.818 kg)   Constitutional: Vital signs reviewed.  Patient is a well-developed and well-nourished woman in no acute distress and cooperative with exam.  Mouth: no erythema or exudates, MMM Eyes: Mild scleral icterus Cardiovascular: RRR, S1 normal, S2 normal, no MRG, pulses symmetric and intact bilaterally Pulmonary/Chest: CTAB, no wheezes, rales, or rhonchi Abdominal: Soft. Non-tender, non-distended, bowel sounds are normal, no masses, organomegaly, or guarding present.  Neurological: A&O x3 Skin: Warm, dry and  intact. No rash, cyanosis, or clubbing.  Psychiatric: Normal mood and affect. speech and behavior is normal. Judgment and thought content normal. Cognition and memory are normal.   Assessment & Plan:  Case and care discussed with Dr. Josem Kaufmann. Patient to return in 1 week for lab followup. Please see problem-oriented charting for further details.

## 2011-11-27 NOTE — Assessment & Plan Note (Signed)
Hydrochlorothiazide and lisinopril refilled today.

## 2011-12-02 ENCOUNTER — Other Ambulatory Visit: Payer: Self-pay | Admitting: *Deleted

## 2011-12-02 DIAGNOSIS — K219 Gastro-esophageal reflux disease without esophagitis: Secondary | ICD-10-CM

## 2011-12-02 MED ORDER — OMEPRAZOLE 40 MG PO CPDR: 40.0000 mg | DELAYED_RELEASE_CAPSULE | Freq: Two times a day (BID) | ORAL | Status: DC

## 2011-12-04 ENCOUNTER — Encounter: Payer: Medicaid Other | Admitting: Internal Medicine

## 2011-12-07 ENCOUNTER — Encounter: Payer: Medicaid Other | Admitting: Internal Medicine

## 2011-12-10 ENCOUNTER — Ambulatory Visit (INDEPENDENT_AMBULATORY_CARE_PROVIDER_SITE_OTHER): Payer: Medicaid Other | Admitting: Internal Medicine

## 2011-12-10 ENCOUNTER — Encounter: Payer: Self-pay | Admitting: Internal Medicine

## 2011-12-10 VITALS — BP 121/78 | HR 85 | Temp 97.1°F | Ht 61.5 in | Wt 190.5 lb

## 2011-12-10 DIAGNOSIS — K754 Autoimmune hepatitis: Secondary | ICD-10-CM

## 2011-12-10 DIAGNOSIS — Z23 Encounter for immunization: Secondary | ICD-10-CM

## 2011-12-10 LAB — COMPREHENSIVE METABOLIC PANEL
Albumin: 3.8 g/dL (ref 3.5–5.2)
Alkaline Phosphatase: 150 U/L — ABNORMAL HIGH (ref 39–117)
BUN: 14 mg/dL (ref 6–23)
CO2: 28 mEq/L (ref 19–32)
Calcium: 9.1 mg/dL (ref 8.4–10.5)
Glucose, Bld: 130 mg/dL — ABNORMAL HIGH (ref 70–99)
Potassium: 3.3 mEq/L — ABNORMAL LOW (ref 3.5–5.3)

## 2011-12-10 LAB — HEPATITIS C ANTIBODY: HCV Ab: NEGATIVE

## 2011-12-10 LAB — HEPATITIS B SURFACE ANTIBODY,QUALITATIVE: Hep B S Ab: NEGATIVE

## 2011-12-10 NOTE — Progress Notes (Signed)
Subjective:   Patient ID: Alexandria White female   DOB: [birth year] 46 y.o.   MRN: [id number]  HPI: [name] is a 46 y.o. yo woman with history of autoimmune hepatitis.  I saw pt on 11/27/11 for c/o abdominal pain and nausea.  Abdominal pain has improved, some nausea still persists.  She felt a bit nauseated this morning, but better after having crackers in clinic.  She notes 1-2 episodes of green vomitus over the last week.  No diarrhea. Good BMs.   No abdominal pain or nausea at present.   Past Medical History  Diagnosis Date  . Cystic acne   . 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 // AFP 15 (03/2009)  // Was changed from azathioprine bc unable to achieve remission --> now chronically on Cellcept and prednisone since May 2011// Followed by Dr . Christella Hartigan ([facility]), and now with Dr. Larey Dresser ([facility])  . 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 Perimenopausal   . Macrocytosis without anemia     BL MCV 102-111. Unclear cause. Possibly due to hypothyroidism vs liver disease (although no clear history of alcohol abuse)  // RBC folate and B12 are within normal limits  . Tobacco abuse   . HTN (hypertension)   . GERD (gastroesophageal reflux disease)     EGD (10/2010) - showing mild gastritis, bx negative - by Dr. Christella Hartigan  . Headache     tension with features of migraine  . History of rectal bleeding     2/2 hemorrhoids, colonoscopy 2008 --> next due 2018  . Hemorrhoids     Small internal and external hemorrhoids noted on colonoscopy (05/2006) - thought to be the cause of the rectal bleeding that she noted.    Current Outpatient Prescriptions  Medication Sig Dispense Refill  . cloNIDine (CATAPRES) 0.1 MG tablet Take 0.2 mg by mouth at bedtime.      . [employer] VITAMIN D3  1000 UNITS capsule TAKE 1 CAPSULE (1,000 UNITS TOTAL) BY MOUTH DAILY.  30 capsule  11  . hydrochlorothiazide (HYDRODIURIL) 25 MG tablet Take 1 tablet (25 mg total) by mouth daily.  31 tablet  3  . lisinopril (PRINIVIL,ZESTRIL) 40 MG tablet Take 1 tablet (40 mg total) by mouth daily.  31 tablet  3  . loratadine (CLARITIN) 10 MG tablet Take 10 mg by mouth daily as needed. allergies      . mycophenolate (CELLCEPT) 250 MG capsule Take 1,000 mg by mouth 2 (two) times daily.       Marland Kitchen omeprazole (PRILOSEC) 40 MG capsule Take 1 capsule (40 mg total) by mouth 2 (two) times daily before a meal.  30 capsule  3  . topiramate (TOPAMAX) 100 MG tablet Take 100 mg by mouth 2 (two) times daily as needed. For headaches       Family History  Problem Relation Age of Onset  . Diabetes Mother   . Migraines Mother   . Cancer Brother    History   Social History  . Marital Status: Single    Spouse Name: [name]    Number of Children: 6  . Years of Education: [name]   Social History Main Topics  . Smoking status: Current Everyday Smoker -- 5.0 packs/day  for 3 years    Types: Cigarettes  . Smokeless tobacco: Never Used   Comment: 3-4cigs/day.  Cutting back  . Alcohol Use: No  . Drug Use: No  . Sexually Active: None   Other Topics Concern  . 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 + Decreased appetite  HEENT: Denies photophobia, eye pain, redness, hearing loss, ear pain, congestion, sore throat, rhinorrhea, sneezing, mouth sores, trouble swallowing, neck pain, neck stiffness and tinnitus.  +  intermittent blurry vision-hasn't been to the eye doctor in 2 years but has history of bifocal Respiratory: Denies SOB, DOE, cough, chest tightness,  and wheezing.   Cardiovascular: Denies chest pain, palpitations and leg swelling.  Gastrointestinal: Denies diarrhea, constipation, blood in stool  Genitourinary: Denies dysuria, urgency, frequency, hematuria, flank pain and difficulty urinating.  Skin: Denies pallor, rash and wound.  Neurological: Denies dizziness, seizures, syncope, weakness, light-headedness, numbness and headaches.  Psychiatric/Behavioral: Denies suicidal ideation, mood changes, confusion, nervousness, sleep disturbance and agitation  Objective:  Physical Exam: Filed Vitals:   12/10/11 0846  BP: 121/78  Pulse: 85  Temp: 97.1 F (36.2 C)  TempSrc: Oral  Height: 5' 1.5" (1.562 m)  Weight: 190 lb 8 oz (86.41 kg)   Constitutional: Vital signs reviewed.  Patient is a well-developed and well-nourished woman in no acute distress and cooperative with exam.  Eyes: PERRL, EOMI, conjunctivae normal, No scleral icterus.  Cardiovascular: RRR, S1 normal, S2 normal, no MRG, pulses symmetric and intact bilaterally Pulmonary/Chest: CTAB, no wheezes, rales, or rhonchi Abdominal: Soft. Non-tender, non-distended, bowel sounds are normal, no masses, organomegaly, or guarding present.  Musculoskeletal: No joint deformities, erythema, or stiffness, ROM full and no nontender Neurological: A&O x3, Strength is normal and symmetric bilaterally, cranial nerve II-XII are grossly intact, no focal motor deficit, sensory intact to light touch bilaterally.  Skin: Warm, dry and intact. No rash, cyanosis, or clubbing.   Assessment & Plan:   Case and care discussed with Dr. Eben Burow. Patient to return to followup with her primary care physician in one month. Please see problem-oriented charting for further details.

## 2011-12-10 NOTE — Assessment & Plan Note (Addendum)
Symptoms of nausea persist, but somewhat improved; Pain improved.    -Continue Prednisone 10mg  daily.   -Patient to follow up with hepatologist in September 2013.  Also follow with PCP in one month.   -Repeat CMET and hepatitis panel for B. and C. today.  (Records from [facility] do not suggest that she has not been vaccinated and as of 2010, our labs suggest she has not been vaccinated; since she is still feeling somewhat ill, I will check hepatitis panel to avoid returning for 3 hepatitis B shots; since hepatitis A vaccine would need to be given every year anyway, I will vaccinate her today. I instructed her that it is especially important for her to get this vaccination if she plans on traveling outside the country in the future

## 2011-12-10 NOTE — Patient Instructions (Signed)
Please stop at the lab to have your blood drawn.  I will call you with results of your lab work, and let you know if you need to return for vaccinations.  Continue taking prednisone 10mg  daily.  Please be sure to bring all of your medications with you to every visit.  Should you have any new or worsening symptoms, please be sure to call the clinic at [phone].

## 2011-12-11 ENCOUNTER — Other Ambulatory Visit: Payer: Self-pay | Admitting: Internal Medicine

## 2011-12-11 DIAGNOSIS — K754 Autoimmune hepatitis: Secondary | ICD-10-CM

## 2011-12-11 NOTE — Progress Notes (Signed)
Called patient to inform her that she needs hep B vaccine course.  No answer.  Order placed.  Will flag front desk as well.  Labs reveal mild hypokalemia, which patient has exhibited in the past.  She is on HCTZ & Lisinopril.  Consider repeat when she f/u with PCP in 1 month.

## 2011-12-12 ENCOUNTER — Encounter (HOSPITAL_COMMUNITY): Payer: Self-pay | Admitting: Emergency Medicine

## 2011-12-12 ENCOUNTER — Emergency Department (HOSPITAL_COMMUNITY)
Admission: EM | Admit: 2011-12-12 | Discharge: 2011-12-12 | Disposition: A | Discharge status: Home / Self Care | Payer: No Typology Code available for payment source | Attending: Emergency Medicine | Admitting: Emergency Medicine

## 2011-12-12 ENCOUNTER — Emergency Department (HOSPITAL_COMMUNITY): Payer: No Typology Code available for payment source

## 2011-12-12 DIAGNOSIS — R29898 Other symptoms and signs involving the musculoskeletal system: Secondary | ICD-10-CM | POA: Insufficient documentation

## 2011-12-12 DIAGNOSIS — M545 Low back pain, unspecified: Secondary | ICD-10-CM | POA: Insufficient documentation

## 2011-12-12 DIAGNOSIS — Y9229 Other specified public building as the place of occurrence of the external cause: Secondary | ICD-10-CM | POA: Insufficient documentation

## 2011-12-12 DIAGNOSIS — W19XXXA Unspecified fall, initial encounter: Secondary | ICD-10-CM

## 2011-12-12 DIAGNOSIS — M25569 Pain in unspecified knee: Secondary | ICD-10-CM | POA: Insufficient documentation

## 2011-12-12 DIAGNOSIS — W010XXA Fall on same level from slipping, tripping and stumbling without subsequent striking against object, initial encounter: Secondary | ICD-10-CM | POA: Insufficient documentation

## 2011-12-12 MED ORDER — DIAZEPAM 5 MG PO TABS: 5.0000 mg | ORAL_TABLET | Freq: Three times a day (TID) | ORAL | Status: DC | PRN

## 2011-12-12 NOTE — ED Notes (Signed)
Pt reports slip and fall at grocery store  last night. Pt reports r/knee pain and swelling. Reports pain in "tip of tailbone"

## 2011-12-12 NOTE — ED Provider Notes (Signed)
Medical screening examination/treatment/procedure(s) were performed by non-physician practitioner and as supervising physician I was immediately available for consultation/collaboration.  Devoria Albe, MD, Armando Gang   Ward Givens, MD 12/12/11 [phone]

## 2011-12-12 NOTE — ED Provider Notes (Signed)
History     CSN: [id number]  Arrival date & time 12/12/11  1012   First MD Initiated Contact with Patient 12/12/11 1019      Chief Complaint  Patient presents with  . Knee Injury    r/knee pain after slip and fall 12 hrs ago  . Back Pain    (Consider location/radiation/quality/duration/timing/severity/associated sxs/prior treatment) HPI Comments: Pt presents with right knee pain. Pt states that yesterday she was at the grocery store and slipped on some water, causing her to fall forward onto her right knee. Since then, she has had increased pain and swelling in the right knee. The pain is constant and burning in nature and increases with weight-bearing. She can walk but states she has been trying not to move it or put pressure on her right leg. Pt rates the pain as 5/10 in severity. Pt reports that also since her fall yesterday she has had some mild pain in her right lower back. This pain is "prickly" and does not radiate, and is present with movement. Pt has not taken anything for pain. Pt denies weakness, numbness or tingling in the extremities, loss of control of bowel or bladder, fever, chills, abdominal pain, shortness of breath.  Patient is a 46 y.o. female presenting with back pain. The history is provided by the patient.  Back Pain  Pertinent negatives include no fever, no numbness, no abdominal pain and no weakness.    Past Medical History  Diagnosis Date  . Cystic acne   . 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 // AFP 15 (03/2009)  // Was changed from azathioprine bc unable to achieve remission --> now chronically on Cellcept and prednisone since May 2011// Followed by Dr . Christella Hartigan ([facility]), and now with Dr. Larey Dresser ([facility])  . Hashimoto's thyroiditis     hx of  // Thyroperoxidase antibody positive / high  (07/2006)  . Perimenopausal   . Macrocytosis without anemia     BL MCV 102-111. Unclear cause. Possibly due to hypothyroidism vs liver disease (although no clear history of alcohol abuse)  // RBC folate and B12 are within normal limits  . Tobacco abuse   . HTN (hypertension)   . GERD (gastroesophageal reflux disease)     EGD (10/2010) - showing mild gastritis, bx negative - by Dr. Christella Hartigan  . Headache     tension with features of migraine  . History of rectal bleeding     2/2 hemorrhoids, colonoscopy 2008 --> next due 2018  . Hemorrhoids     Small internal and external hemorrhoids noted on colonoscopy (05/2006) - thought to be the cause of the rectal bleeding that she noted.     Past Surgical History  Procedure Date  . Cholecystectomy 1992    laparoscopically   . Ercp   . Colonoscopy   . Tubal ligation     fibroid tumor removed from left fallopian tube    Family History  Problem Relation Age of Onset  . Diabetes Mother   . Migraines Mother   . Cancer Brother     History  Substance Use Topics  . Smoking status: Current Everyday Smoker -- 5.0 packs/day for 3 years    Types: Cigarettes  . Smokeless tobacco: Never Used   Comment: 3-4cigs/day.  Cutting back  .  Alcohol Use: No    OB History    Grav Para Term Preterm Abortions TAB SAB Ect Mult Living                  Review of Systems  Constitutional: Negative for fever and chills.  Respiratory: Negative for shortness of breath.   Gastrointestinal: Negative for abdominal pain.  Musculoskeletal: Positive for back pain and joint swelling.  Neurological: Negative for weakness and numbness.    Allergies  Aspirin and Latex  Home Medications   Current Outpatient Rx  Name Route Sig Dispense Refill  . VITAMIN D 1000 UNITS PO TABS Oral Take 1,000 Units by mouth daily.    Marland Kitchen CLONIDINE HCL 0.1 MG PO TABS Oral Take 0.2 mg by mouth at bedtime.    Marland Kitchen HYDROCHLOROTHIAZIDE 25 MG PO TABS Oral Take 25 mg by mouth daily.    Marland Kitchen  LISINOPRIL 40 MG PO TABS Oral Take 40 mg by mouth daily.    Marland Kitchen MYCOPHENOLATE MOFETIL 250 MG PO CAPS Oral Take 1,000 mg by mouth 2 (two) times daily.     Marland Kitchen OMEPRAZOLE 40 MG PO CPDR Oral Take 40 mg by mouth 2 (two) times daily before a meal.    . PREDNISONE 10 MG PO TABS Oral Take 10 mg by mouth daily.      BP 135/84  Pulse 70  Temp 98.7 F (37.1 C) (Oral)  Resp 16  Ht 5' 1.5" (1.562 m)  Wt 191 lb (86.637 kg)  BMI 35.50 kg/m2  SpO2 99%  Physical Exam  Nursing note and vitals reviewed. Constitutional: She appears well-developed and well-nourished. No distress.  HENT:  Head: Normocephalic and atraumatic.  Neck: Neck supple.  Pulmonary/Chest: Effort normal.  Musculoskeletal:       Right knee: She exhibits normal range of motion, no swelling, no effusion, no ecchymosis, no deformity, no laceration, no erythema, normal alignment, no LCL laxity and no MCL laxity.       Cervical back: She exhibits no bony tenderness.       Thoracic back: She exhibits no bony tenderness.       Lumbar back: She exhibits no bony tenderness.       Back:       Legs:      Diffuse tenderness over right knee and over patella.  Normal AROM, no discoloration.    Lower extremities:  Strength 5/5, sensation intact, distal pulses intact.     Neurological: She is alert.  Skin: She is not diaphoretic.    ED Course  Procedures (including critical care time)  Labs Reviewed - No data to display Dg Knee Complete 4 Views Right  12/12/2011  *RADIOLOGY REPORT*  Clinical Data: Knee pain and weakness after fall.  RIGHT KNEE - COMPLETE 4+ VIEW  Comparison: None.  Findings: Four views of the right knee were obtained.  No evidence for acute fracture or dislocation.  No definite suprapatellar joint effusion.  Normal alignment of the right knee.  IMPRESSION: No acute bony abnormality.  Original Report Authenticated By: Richarda Overlie, M.D.     1. Fall   2. Knee pain   3. Low back pain       MDM  Pt with slip and fall  last night now with right lower back pain, likely strain, and right knee pain.  Spine is nontender.  No red flags for back pain.  Xray of knee is negative.  Likely light contusion.  Exam not concerning for ligamentous injury.  Neurovascularly  intact.   Pt placed in knee sleeve.  Pt declines crutches.  D/C home with valium for muscle strain.  Discussed all results with patient.  Pt given return precautions.  Pt verbalizes understanding and agrees with plan.             [place], [place] 12/12/11 1200

## 2011-12-14 ENCOUNTER — Encounter: Payer: Self-pay | Admitting: Internal Medicine

## 2011-12-14 ENCOUNTER — Ambulatory Visit (INDEPENDENT_AMBULATORY_CARE_PROVIDER_SITE_OTHER): Payer: Medicaid Other | Admitting: *Deleted

## 2011-12-14 ENCOUNTER — Ambulatory Visit (INDEPENDENT_AMBULATORY_CARE_PROVIDER_SITE_OTHER): Payer: Medicaid Other | Admitting: Internal Medicine

## 2011-12-14 VITALS — BP 135/88 | HR 77 | Temp 98.0°F | Ht 61.0 in | Wt 191.8 lb

## 2011-12-14 DIAGNOSIS — I10 Essential (primary) hypertension: Secondary | ICD-10-CM

## 2011-12-14 DIAGNOSIS — K754 Autoimmune hepatitis: Secondary | ICD-10-CM

## 2011-12-14 DIAGNOSIS — Z23 Encounter for immunization: Secondary | ICD-10-CM

## 2011-12-14 DIAGNOSIS — M25569 Pain in unspecified knee: Secondary | ICD-10-CM

## 2011-12-14 LAB — HEPATITIS B E ANTIBODY: Hepatitis Be Antibody: NEGATIVE

## 2011-12-14 NOTE — Patient Instructions (Signed)
We will have you take ibuprofen for your pain. Take 2 of the 200 mg tabs three times per day for 3-4 days. Then just take it as needed for pain. We will give you a prescription for a cane while it's getting better. Hold the cane in your good (left) hand to walk. If you have any problems or questions please call us at [phone]. Come back with your doctor as previously scheduled.

## 2011-12-14 NOTE — Progress Notes (Signed)
Subjective:     Patient ID: Alexandria White, female   DOB: [birth year], 46 y.o.   MRN: [id number]  HPI The patient is a 46 year old female who comes in today for a followup of an ER visit for a slip and fall with right knee pain. She did have x-ray at the ED on Saturday which showed no fracture or dislocation. She was at the grocery store there was water on the floor and she did slip and fall. She did land on the top of her knee cap. She has not had any problems with her knee giving out before or since then. She states that she was given prescription for Valium which she did not fill. She has taken nothing for pain since then and would like advice on what to take. She does have history of autoimmune hepatitis and this is why she is reluctant to take medicines without her doctor's approval. She did get hepatitis B vaccine at today's visit. No falls since returning home.  Review of Systems  Constitutional: Positive for activity change. Negative for fever, chills, diaphoresis, appetite change, fatigue and unexpected weight change.  HENT: Negative.   Eyes: Negative.   Respiratory: Negative.  Negative for cough, choking and wheezing.   Cardiovascular: Negative.  Negative for chest pain, palpitations and leg swelling.  Gastrointestinal: Negative for nausea, vomiting, abdominal pain and diarrhea.  Musculoskeletal: Positive for myalgias and arthralgias. Negative for back pain, joint swelling and gait problem.  Skin: Negative.   Neurological: Negative.   Psychiatric/Behavioral: Negative.        Objective:   Physical Exam  Constitutional: She is oriented to person, place, and time. She appears well-developed and well-nourished. No distress.  HENT:  Head: Normocephalic and atraumatic.  Eyes: EOM are normal. Pupils are equal, round, and reactive to light.  Neck: Normal range of motion. Neck supple.  Cardiovascular: Normal rate.   Pulmonary/Chest: Effort normal and breath sounds normal.  Abdominal:  Soft. Bowel sounds are normal.  Musculoskeletal: Normal range of motion. She exhibits tenderness. She exhibits no edema.       No effusion on right knee, no medial or lateral tenderness and no tibial or fibular head tenderness.   Neurological: She is alert and oriented to person, place, and time.  Skin: She is not diaphoretic.       Assessment/Plan:   1. Right knee pain-pain resultant from mechanical slip and fall with wet grocery store floor. Will advise her to take ibuprofen 400 mg 3 times a day for 3-4 days then as needed only for pain. She will be seen back on 9/19 with her PCP. Advised her if the pain is not resolved in one to 2 weeks to call our clinic to be seen again. Also given prescription for cane.  2. Autoimmune hepatitis-the patient was given hepatitis B vaccine at today's visit.  3. Hypertension-the patient is on HCTZ and does have chronically slightly low potassium. Will need to have repeat basic metabolic panel at next visit with PCP and may need to be on potassium chronically.  4. Disposition-the patient be seen back on 9/19 with her PCP. Advised to take ibuprofen 400 mg 3 times a day for 3-4 days for her knee pain. Also given prescription for cane in case she has any instability during this acute episode of pain.

## 2011-12-14 NOTE — Assessment & Plan Note (Signed)
Patient did get hepatitis B vaccine at today's visit.

## 2011-12-14 NOTE — Assessment & Plan Note (Signed)
The patient does have slightly low potassium at last visit and will need repeat basic metabolic panel at next visit. She may need to be on small dose of potassium chronically.

## 2011-12-18 ENCOUNTER — Other Ambulatory Visit: Payer: Self-pay | Admitting: *Deleted

## 2011-12-18 MED ORDER — LORATADINE 10 MG PO TABS: 10.0000 mg | ORAL_TABLET | Freq: Every day | ORAL | Status: DC | PRN

## 2011-12-18 NOTE — Telephone Encounter (Signed)
Pt states she has scratchy, itchy throat, dry cough, constantly clearing throat. She would like a refill.

## 2012-01-13 ENCOUNTER — Other Ambulatory Visit: Payer: Self-pay | Admitting: *Deleted

## 2012-01-13 ENCOUNTER — Other Ambulatory Visit: Payer: Self-pay | Admitting: Internal Medicine

## 2012-01-13 NOTE — Telephone Encounter (Signed)
Pt was called; message left to call the clinic. 

## 2012-01-13 NOTE — Telephone Encounter (Signed)
Pt stated in July, you talked to Dr Katrinka Blazing at [facility] and had Dr Everardo Beals to increase Prednisone to 10mg  (in August). Her liver enzyme level was elevated. She is currently taking 10mg .

## 2012-01-13 NOTE — Telephone Encounter (Signed)
Please call the patient and inform her that she needs to request this refill from her hepatologist at [facility] because they have been the ones adjusting her Prednisone. Therefore, I do not know what dose of medication she should be prescribed at this point.  I am therefore refusing the refill, unless the patient clarify why specifically I am being requested to fill it (Ie - if hepatologist has specific dose in mind but wants me to prescribe it).  Thank you! - Johnette Abraham, D.O., 01/13/2012, 1:28 PM

## 2012-01-14 ENCOUNTER — Ambulatory Visit (INDEPENDENT_AMBULATORY_CARE_PROVIDER_SITE_OTHER): Payer: Medicaid Other | Admitting: Internal Medicine

## 2012-01-14 ENCOUNTER — Encounter: Payer: Self-pay | Admitting: Internal Medicine

## 2012-01-14 VITALS — BP 134/90 | HR 70 | Temp 98.2°F | Ht 61.0 in | Wt 141.0 lb

## 2012-01-14 DIAGNOSIS — Z720 Tobacco use: Secondary | ICD-10-CM

## 2012-01-14 DIAGNOSIS — F17200 Nicotine dependence, unspecified, uncomplicated: Secondary | ICD-10-CM

## 2012-01-14 DIAGNOSIS — I10 Essential (primary) hypertension: Secondary | ICD-10-CM

## 2012-01-14 DIAGNOSIS — N951 Menopausal and female climacteric states: Secondary | ICD-10-CM

## 2012-01-14 DIAGNOSIS — Z23 Encounter for immunization: Secondary | ICD-10-CM

## 2012-01-14 DIAGNOSIS — N949 Unspecified condition associated with female genital organs and menstrual cycle: Secondary | ICD-10-CM

## 2012-01-14 DIAGNOSIS — E876 Hypokalemia: Secondary | ICD-10-CM

## 2012-01-14 DIAGNOSIS — R5383 Other fatigue: Secondary | ICD-10-CM

## 2012-01-14 DIAGNOSIS — K754 Autoimmune hepatitis: Secondary | ICD-10-CM

## 2012-01-14 DIAGNOSIS — Z0000 Encounter for general adult medical examination without abnormal findings: Secondary | ICD-10-CM

## 2012-01-14 MED ORDER — LISINOPRIL-HYDROCHLOROTHIAZIDE 20-12.5 MG PO TABS: 2.0000 | ORAL_TABLET | Freq: Every day | ORAL | Status: DC

## 2012-01-14 MED ORDER — PREDNISONE 10 MG PO TABS: 10.0000 mg | ORAL_TABLET | Freq: Every day | ORAL | Status: DC

## 2012-01-14 NOTE — Telephone Encounter (Signed)
Pt was seen by MD today

## 2012-01-14 NOTE — Assessment & Plan Note (Signed)
Pertinent Data: BP Readings from Last 3 Encounters:  01/14/12 134/90  12/14/11 135/88  12/12/11 138/98    Basic Metabolic Panel:    Component Value Date/Time   NA 141 12/10/2011 0945   K 3.3* 12/10/2011 0945   CL 103 12/10/2011 0945   CO2 28 12/10/2011 0945   BUN 14 12/10/2011 0945   CREATININE 0.82 12/10/2011 0945   CREATININE 0.7 08/25/2011 1057   GLUCOSE 130* 12/10/2011 0945   GLUCOSE 96 05/10/2006 0936   CALCIUM 9.1 12/10/2011 0945    Assessment: Disease Control: controlled  Progress toward goals: at goal  Barriers to meeting goals: nonadherence to medications - usually compliant, however, will be out for weeks at a time if she cannot afford.    Has been out for 1 week.     Plan:  Continue clonidine - will consider to discontinue slowly in future in setting of her inconsistency of taking medications.  Change Lisinopril and HCTZ (separate pills) to combo pill of Lisinopril-HCTZ 20-12.5mg  x2 tabs daily.  Check CMET today.

## 2012-01-14 NOTE — Patient Instructions (Addendum)
Please follow-up at the clinic in 3 month, at which time we will reevaluate blood pressure, smoking, cholesterol - OR, please follow-up in the clinic sooner if needed.  There have been changes in your medications:  STOP Lisinopril   STOP Hydrochlorothiazide  START Lisinopril - Hydrochlorothiazide (combo pill) - you will take 2 pills once a day.    You are getting labs today, if they are abnormal I will give you a call.   Please talk to your doctors at [facility] about taking prednisone refills (as they know the amount they are recommending for you).  If you have been started on new medication(s), and you develop symptoms concerning for allergic reaction, including, but not limited to, throat closing, tongue swelling, rash, please stop the medication immediately and call the clinic at [phone], and go to the ER.  If symptoms worsen, or new symptoms arise, please call the clinic or go to the ER.  Please bring all of your medications in a bag to your next visit.   Smoking Cessation Tips 1-800-QUIT-NOW  This document explains the best ways for you to quit smoking and new treatments to help. It lists new medicines that can double or triple your chances of quitting and quitting for good. It also considers ways to avoid relapses and concerns you may have about quitting, including weight gain.   NICOTINE: A POWERFUL ADDICTION:  If you have tried to quit smoking, you know how hard it can be. It is hard because nicotine is a very addictive drug. For some people, it can be as addictive as heroin or cocaine. Usually, people make 2 or 3 tries, or more, before finally being able to quit. Each time you try to quit, you can learn about what helps and what hurts. Quitting takes hard work and a lot of effort, but you can quit smoking.   QUITTING SMOKING IS ONE OF THE MOST IMPORTANT THINGS YOU WILL EVER DO:  You will live longer, feel better, and live better.  The impact on your body of quitting smoking is  felt almost immediately:  Within 20 minutes, blood pressure decreases. Pulse returns to its normal level.  After 8 hours, carbon monoxide levels in the blood return to normal. Oxygen level increases.  After 24 hours, chance of heart attack starts to decrease. Breath, hair, and body stop smelling like smoke.  After 48 hours, damaged nerve endings begin to recover. Sense of taste and smell improve.  After 72 hours, the body is virtually free of nicotine. Bronchial tubes relax and breathing becomes easier.  After 2 to 12 weeks, lungs can hold more air. Exercise becomes easier and circulation improves.  Quitting will lower your chance of having a heart attack, stroke, cancer, or lung disease:  After 1 year, the risk of coronary heart disease is cut in half.  After 5 years, the risk of stroke falls to the same as a nonsmoker.  After 10 years, the risk of lung cancer is cut in half and the risk of other cancers decreases significantly.  After 15 years, the risk of coronary heart disease drops, usually to the level of a nonsmoker.  If you are pregnant, quitting smoking will improve your chances of having a healthy baby.  The people you live with, especially your children, will be healthier.  You will have extra money to spend on things other than cigarettes.   FIVE KEYS TO QUITTING:  Studies have shown that these 5 steps will help you quit  smoking and quit for good. You have the best chances of quitting if you use them together:   1. GET READY  Set a quit date.  Change your environment.  Get rid of ALL cigarettes, ashtrays, matches, and lighters in your home, car, and place of work.  Do not let people smoke in your home.  Review your past attempts to quit. Think about what worked and what did not.  Once you quit, do not smoke. NOT EVEN A PUFF!   2. GET SUPPORT AND ENCOURAGEMENT  Studies have shown that you have a better chance of being successful if you have help. You can get support in many  ways. Tell your family, friends, and coworkers that you are going to quit and need their support. Ask them not to smoke around you.  Talk to your caregivers (doctor, dentist, nurse, pharmacist, psychologist, and/or smoking counselor).  Get individual, group, or telephone counseling and support. The more counseling you have, the better your chances are of quitting. Programs are available at [employer] and health centers. Call your local health department for information about programs in your area.  Spiritual beliefs and practices may help some smokers quit.  Quit meters are Photographer that keep track of quit statistics, such as amount of "quit-time," cigarettes not smoked, and money saved.  Many smokers find one or more of the many self-help books available useful in helping them quit and stay off tobacco.   3. LEARN NEW SKILLS AND BEHAVIORS  Try to distract yourself from urges to smoke. Talk to someone, go for a walk, or occupy your time with a task.  When you first try to quit, change your routine. Take a different route to work. Drink tea instead of coffee. Eat breakfast in a different place.  Do something to reduce your stress. Take a hot bath, exercise, or read a book.  Plan something enjoyable to do every day. Reward yourself for not smoking.  Explore interactive web-based programs that specialize in helping you quit.   4. GET MEDICINE AND USE IT CORRECTLY .  Medicines can help you stop smoking and decrease the urge to smoke. Combining medicine with the above behavioral methods and support can quadruple your chances of successfully quitting smoking.  Talk with your doctor about these options.  5. BE PREPARED FOR RELAPSE OR DIFFICULT SITUATIONS  Most relapses occur within the first 3 months after quitting. Do not be discouraged if you start smoking again. Remember, most people try several times before they finally quit.  You may have symptoms of  withdrawal because your body is used to nicotine. You may crave cigarettes, be irritable, feel very hungry, cough often, get headaches, or have difficulty concentrating.  The withdrawal symptoms are only temporary. They are strongest when you first quit, but they will go away within 10 to 14 days.    Here are some difficult situations to watch for:  Alcohol. Avoid drinking alcohol. Drinking lowers your chances of successfully quitting.  Caffeine. Try to reduce the amount of caffeine you consume. It also lowers your chances of successfully quitting.  Other smokers. Being around smoking can make you want to smoke. Avoid smokers.  Weight gain. Many smokers will gain weight when they quit, usually less than 10 pounds. Eat a healthy diet and stay active. Do not let weight gain distract you from your main goal, quitting smoking. Some medicines that help you quit smoking may also help delay weight gain. You  can always lose the weight gained after you quit.  Bad mood or depression. There are a lot of ways to improve your mood other than smoking.   If you are having problems with any of these situations, talk to your caregiver.   SPECIAL SITUATIONS OR CONDITIONS:  Studies suggest that everyone can quit smoking. Your situation or condition can give you a special reason to quit. Pregnant women/New mothers: By quitting, you protect your baby's health and your own.  Hospitalized patients: By quitting, you reduce health problems and help healing.  Heart attack patients: By quitting, you reduce your risk of a second heart attack.  Lung, head, and neck cancer patients: By quitting, you reduce your chance of a second cancer.  Parents of children and adolescents: By quitting, you protect your children from illnesses caused by secondhand smoke.   QUESTIONS TO THINK ABOUT: Think about the following questions before you try to stop smoking. You may want to talk about your answers with your caregiver.  Why do you  want to quit?  If you tried to quit in the past, what helped and what did not?  What will be the most difficult situations for you after you quit? How will you plan to handle them?  Who can help you through the tough times? Your family? Friends? Caregiver?  What pleasures do you get from smoking? What ways can you still get pleasure if you quit?   Here are some questions to ask your caregiver:  How can you help me to be successful at quitting?  What medicine do you think would be best for me and how should I take it?  What should I do if I need more help?  What is smoking withdrawal like? How can I get information on withdrawal?   Quitting takes hard work and a lot of effort, but you can quit smoking.   FOR MORE INFORMATION  Smokefree.gov (http://www.davis-sullivan.com/) provides free, accurate, evidence-based information and professional assistance to help support the immediate and long-term needs of people trying to quit smoking.  Document Released: 04/07/2001 Document Re-Released: 10/01/2009  [facility] Patient Information 2011 [place], [place].

## 2012-01-14 NOTE — Progress Notes (Signed)
Subjective:    Patient: Alexandria White   Age: 46 y.o.   Gender: female   MRN: [id number]  DOB: 1965-10-11     HPI: [name] is a 46 y.o. 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 Currently taking clonidine (Catapres), hydrochlorothiazide (HCTZ) and lisinopril (Prinivil). Patient misses doses 0 x per week on average. Had lighteadedness/ dizziness last week, when off of medications- has resolved. denies headaches, dizziness, lightheadedness, chest pain, shortness of breath.  does request refills today.  2) Tobacco abuse - patient continues to smoke 4 cigarettes per day. There have been prior attempts to quit - with successes but only when she was pregnant. Barriers to quit (if any) include: none   Review of Systems: Per HPI.   Current Outpatient Medications: Medication Sig  . cholecalciferol (VITAMIN D) 1000 UNITS tablet Take 1,000 Units by mouth daily.  . cloNIDine (CATAPRES) 0.1 MG tablet Take 0.2 mg by mouth at bedtime.  . hydrochlorothiazide (HYDRODIURIL) 25 MG tablet Take 25 mg by mouth daily.  Marland Kitchen lisinopril (PRINIVIL,ZESTRIL) 40 MG tablet Take 40 mg by mouth daily.  Marland Kitchen loratadine (CLARITIN) 10 MG tablet Take 1 tablet (10 mg total) by mouth daily as needed. allergies  . mycophenolate (CELLCEPT) 250 MG capsule Take 1,000 mg by mouth 2 (two) times daily.   Marland Kitchen omeprazole (PRILOSEC) 40 MG capsule Take 40 mg by mouth 2 (two) times daily before a meal.  . predniSONE (DELTASONE) 10 MG tablet Take 10 mg by mouth daily.     Allergies  Allergen Reactions  . Aspirin     Stomach pain  . Latex Rash    Past Medical History  Diagnosis Date  . Cystic acne   . Autoimmune hepatitis DX: Sept 2010    Initial labs (12/2008) - ANA Alexandria White neg, F actin / IgG Ab high at 53 //  Cirrhosis on index liver bx (03/2009) and at least bridging fibrosis on 2nd biopsy in 01/2010. // AFP 15 (03/2009)  // Was changed from azathioprine bc unable to achieve remission --> now chronically on Cellcept and prednisone since May 2011// Followed by Dr . Alexandria White ([facility]), and now with Dr. Larey White ([facility])  . 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 Perimenopausal   . Macrocytosis without anemia     BL MCV 102-111. Unclear cause. Possibly due to hypothyroidism vs liver disease (although no clear history of alcohol abuse)  // RBC folate and B12 are within normal limits  . Tobacco abuse   . HTN (hypertension)   . GERD (gastroesophageal reflux disease)     EGD (10/2010) - showing mild gastritis, bx negative - by Dr. Christella White  . Headache     tension with features of migraine  . History of rectal bleeding     2/2 hemorrhoids, colonoscopy 2008 --> next due 2018  . Hemorrhoids     Small internal and external hemorrhoids noted on colonoscopy (05/2006) - thought to be the cause of the rectal bleeding that she noted.     Past Surgical History  Procedure Date  . Cholecystectomy 1992    laparoscopically   . Ercp   . Colonoscopy   . Tubal ligation  fibroid tumor removed from left fallopian tube     Objective:    Physical Exam: Filed Vitals:   01/14/12 1354  BP: 148/97  Pulse: 76  Temp: 98.2 F (36.8 C)      General: Vital signs reviewed and noted. Well-developed, well-nourished, in no acute distress; alert, appropriate and cooperative throughout examination.  Head: Normocephalic, atraumatic.  Eyes: Conjunctivae/corneas mildly icteric. PERRL, EOM's intact..  Nose: Mucous membranes moist, not inflammed, nonerythematous.  Throat: Oropharynx nonerythematous, no exudate appreciated.   Neck: No deformities, masses, or tenderness noted.  Lungs:  Normal respiratory effort. Clear to auscultation BL without crackles or wheezes.    Heart: RRR. S1 and S2 normal without gallop, rubs.   Abdomen:  BS normoactive. Soft, Nondistended, epigastric TTP. No masses or organomegaly.  Extremities: No pretibial edema.     Assessment/ Plan:   Case and plan of care discussed with Dr. Blanch Media.

## 2012-01-14 NOTE — Telephone Encounter (Signed)
But then she was supposed to follow-up at [facility] so that they could continue to adjust this. Did she follow up?

## 2012-01-14 NOTE — Assessment & Plan Note (Signed)
1. The patient was counseled on the dangers of tobacco use, which include, but are not limited to cardiovascular disease, increased cancer risk of multiple types of cancer, COPD, peripheral vascular disease, strokes. 2. She was also counseled on the benefits of smoking cessation. 3. The patient was firmly advised to quit and referred to a tobacco cessation program.   4. We also reviewed strategies to maximize success, including:  Removing cigarettes and smoking materials from environment  Stress management  Substitution of other forms of reinforcement Support of family/friends.  Selecting a quit date.  Patient provided contact information for 1-800-QUIT-NOW   

## 2012-01-14 NOTE — Assessment & Plan Note (Signed)
Assessment: Followed at [facility] and was most recently recommended to start on prednisone 10mg  daily. She is scheduled to follow-up in next few weeks. See my note from 10/2011.   Plan:      I will refill her prednisone this one time.   However, pt was advised that I will not continue to refill her prednisone as I am not the one that is adjusting her regimen (and do not want to inappropriately be prescribing the wrong dosing for her).  Patient was instructed to ask the [facility] clinic to resume prednisone refills from now on, as they are guiding adjustment of her dosage.

## 2012-01-14 NOTE — Assessment & Plan Note (Signed)
Health Maintenance  Topic Date Due  . Influenza Vaccine  12/26/2012  . Pap Smear  09/10/2013  . Tetanus/tdap  11/26/2021    Assessment:  Procedures due: None  Labs due: None  Immunizations due: Flu shot  Plan:  Flu shot today.

## 2012-01-15 LAB — COMPREHENSIVE METABOLIC PANEL
ALT: 82 U/L — ABNORMAL HIGH (ref 0–35)
AST: 109 U/L — ABNORMAL HIGH (ref 0–37)
Albumin: 3.8 g/dL (ref 3.5–5.2)
Alkaline Phosphatase: 136 U/L — ABNORMAL HIGH (ref 39–117)
BUN: 9 mg/dL (ref 6–23)
Calcium: 9.2 mg/dL (ref 8.4–10.5)
Creat: 0.84 mg/dL (ref 0.50–1.10)
Glucose, Bld: 102 mg/dL — ABNORMAL HIGH (ref 70–99)
Potassium: 3.9 mEq/L (ref 3.5–5.3)
Total Bilirubin: 1.2 mg/dL (ref 0.3–1.2)

## 2012-01-15 NOTE — Progress Notes (Signed)
Quick Note:  Liver function tests still elevated, but are trending down from prior. She will need to continue to follow-up at [facility] for continued surveillance of her autoimmune hepatitis and adjustment of her prednisone as appropriate. ______

## 2012-01-15 NOTE — Progress Notes (Signed)
Quick Note:  Vitamin D level looks good, can continue regular over the counter supplementation. ______

## 2012-01-29 ENCOUNTER — Other Ambulatory Visit: Payer: Self-pay | Admitting: Internal Medicine

## 2012-02-10 ENCOUNTER — Telehealth: Payer: Self-pay

## 2012-02-10 NOTE — Telephone Encounter (Signed)
Pt needs ROV to discuss EGD for dysphagia

## 2012-02-10 NOTE — Telephone Encounter (Signed)
Pt has been scheduled.  °

## 2012-02-15 ENCOUNTER — Other Ambulatory Visit: Payer: Self-pay | Admitting: Internal Medicine

## 2012-02-16 NOTE — Telephone Encounter (Signed)
Pt called and explained the need to call md office at [facility], she is agreeable

## 2012-02-16 NOTE — Telephone Encounter (Signed)
Please call the patient and inform her that I will not be refilling her prednisone. We discussed this in length and in detail during her last visit. She needs to request these refills from [facility] because they are the ones that are adjusting her regimen.  I spoke with her very specifically about this during our last visit.  Thank you! - Johnette Abraham, D.O., 02/16/2012, 9:49 AM

## 2012-02-17 NOTE — Addendum Note (Signed)
Addended by: Dorie Rank E on: 02/17/2012 02:00 PM   Modules accepted: Orders

## 2012-02-19 ENCOUNTER — Ambulatory Visit: Payer: Medicaid Other | Admitting: Internal Medicine

## 2012-03-07 ENCOUNTER — Ambulatory Visit (INDEPENDENT_AMBULATORY_CARE_PROVIDER_SITE_OTHER): Payer: Medicaid Other | Admitting: Gastroenterology

## 2012-03-07 ENCOUNTER — Ambulatory Visit: Payer: Medicaid Other | Admitting: Gastroenterology

## 2012-03-07 ENCOUNTER — Encounter: Payer: Self-pay | Admitting: Gastroenterology

## 2012-03-07 VITALS — BP 122/80 | HR 80 | Ht 61.5 in | Wt 191.4 lb

## 2012-03-07 DIAGNOSIS — R1310 Dysphagia, unspecified: Secondary | ICD-10-CM

## 2012-03-07 NOTE — Patient Instructions (Addendum)
You have been given a separate informational sheet regarding your tobacco use, the importance of quitting and local resources to help you quit. Start one OTC pepcid/zantac or generic equivalent.  One pill at bedtime every night. This is very good for nighttime GERD. You will be set up for an upper endoscopy for dysphagia (MAC sedation, LEC). A copy of this information will be made available to Dr. Iva Boop.  You have been scheduled for an endoscopy with propofol. Please follow written instructions given to you at your visit today. If you use inhalers (even only as needed) or a CPAP machine, please bring them with you on the day of your procedure.

## 2012-03-07 NOTE — Progress Notes (Signed)
1. routine risk for colon cancer: no polyps on colonoscopy 05/2006; next colonoscopy 2018  2. Gerd: normal EGD 05/2006; symptoms well controlled on PPI; EGD 10/2010 mild H. Pylori negative gastritis. 3. 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 ANA negative, AMA negative, F actin Antibody IgG Highly positive ( F actin antibody Reference Range:This ELISA assay is based on purified F-Actin IgGantibodies. IgG antibodies to F-Actin are presentin approximately: 75% of patients with autoimmune hepatitis type 1, 65% with autoimmune cholangitis, 30% with primary biliary cirrhosis, and 2% of healthy population.) . ferritin normal, hepatitis A, B, C were all negative. Monospot negative, EBV IgG positive. CT Scan showed no biliary dilation, masses. Essentially normal liver. MRCP/MRI October, 2010: cirrhosis noted, micronodular liver, no CBD stones, mild portal hypertensive changes. November, 2010 and liver biopsy showed "Active cirrhosis" I spoke with the pathologist and he said the findings were consistent with autoimmune disease. December, 2010 started on prednisone and azathioprine at 50 mg a day. Total bili decreased from 7.4-1.9 in 4 weeks' time. January 2011 azathioprine, mono therapy, liver tests seemed to increase slightly. February, 2011: liver tests increased while decreasing prednisone and starting to rely on azathioprine therapy alone. She felt poorly. Azathioprine stopped and prednisone resumed at 20 mg a day. Liver tests improved quickly. [facility] visit April, 2011: Dr. Katrinka Blazing agreed with the diagnosis and recommended that we try CellCept 1 g twice a day and taper steroids. May, 2011 she started CellCept 1 g twice daily. October, 2011: liver test elevated again off prednisone, put back on 30 mg per day prednisone, continue CellCept 2 g per day (Dr. Katrinka Blazing). November 2000 and 11 repeat liver biopsy confirmed active autoimmune hepatitis. January 2012  poorly compliant to [facility] recommendations as well as my own total bilirubin 2.5, AST 900, ALT 600.  02/2012 under care of Dr. Katrinka Blazing at [facility], most recent lfts show normal INR, normal Plts, AST and ALT 50-60 range.   HPI: This is a    very pleasant 46 year old woman whom I last saw 1-2 years ago. She has primarily been getting her care at [facility] with Dr. Katrinka Blazing for her autoimmune hepatitis. She is on prednisone and CellCept, most recent LFTs that I see where from 2 or 3 weeks ago at [facility] in the showing very mildly elevated transaminases normal platelets, normal coags, normal bilirubin.  Has been having dysphagia for about a year.  Indigestions, vomiting.  Vomiting is not very common.  Epigastric abd pains.  Dysphagia to solids usually.  Has been mashing up her foods to adapt.  She has BAD pyrosis.  Takes 1-2 hours before breakfast.  Takes PPI also before dinner.  6 months stable weight.  GB removed many years ago.   Past Medical History  Diagnosis Date  . Cystic acne   . 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 // AFP 15 (03/2009)  // Was changed from azathioprine bc unable to achieve remission --> now chronically on Cellcept and prednisone since May 2011// Followed by Dr . Christella Hartigan ([facility]), and now with Dr. Larey Dresser ([facility])  . 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 Perimenopausal   . Macrocytosis without anemia     BL MCV  102-111. Unclear cause. Possibly due to hypothyroidism vs liver disease (although no clear history of alcohol abuse)  // RBC folate and B12 are within normal limits  . Tobacco abuse   . HTN (hypertension)   . GERD (gastroesophageal reflux disease)     EGD (10/2010) - showing mild gastritis, bx negative - by Dr. Christella Hartigan  . Headache     tension with features  of migraine  . History of rectal bleeding     2/2 hemorrhoids, colonoscopy 2008 --> next due 2018  . Hemorrhoids     Small internal and external hemorrhoids noted on colonoscopy (05/2006) - thought to be the cause of the rectal bleeding that she noted.     Past Surgical History  Procedure Date  . Cholecystectomy 1992    laparoscopically   . Ercp   . Colonoscopy   . Tubal ligation     fibroid tumor removed from left fallopian tube    Current Outpatient Prescriptions  Medication Sig Dispense Refill  . ALLERGY 10 MG tablet TAKE 1 TABLET (10 MG TOTAL) BY MOUTH DAILY AS NEEDED(ALLERGIES)  30 tablet  0  . cholecalciferol (VITAMIN D) 1000 UNITS tablet Take 1,000 Units by mouth daily.      . cloNIDine (CATAPRES) 0.1 MG tablet Take 0.2 mg by mouth at bedtime.      Marland Kitchen lisinopril-hydrochlorothiazide (ZESTORETIC) 20-12.5 MG per tablet Take 2 tablets by mouth daily. FOR BLOOD PRESSURE  60 tablet  3  . mycophenolate (CELLCEPT) 250 MG capsule Take 1,000 mg by mouth 2 (two) times daily.       Marland Kitchen omeprazole (PRILOSEC) 40 MG capsule Take 40 mg by mouth 2 (two) times daily before a meal.      . predniSONE (DELTASONE) 10 MG tablet Take 1 tablet (10 mg total) by mouth daily.  30 tablet  0  . topiramate (TOPAMAX) 15 MG capsule Take 15 mg by mouth as needed.        Allergies as of 03/07/2012 - Review Complete 03/07/2012  Allergen Reaction Noted  . Aspirin  09/30/2006  . Latex Rash 09/30/2006    Family History  Problem Relation Age of Onset  . Diabetes Mother   . Migraines Mother   . Cancer Brother     History   Social History  . Marital Status: Single    Spouse Name: [name]    Number of Children: 6  . Years of Education: [name]   Occupational History  . Not on file.   Social History Main Topics  . Smoking status: Current Every Day Smoker -- 1.0 packs/day for 3 years    Types: Cigarettes  . Smokeless tobacco: Never Used     Comment: 3-4cigs/day.  Cutting back  . Alcohol Use: No  . Drug  Use: No  . Sexually Active: Not on file   Other Topics Concern  . 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 female with long-standing autoimmune hepatitis which seems to be under good control, intermittent solid food dysphagia. GERD.  First she is going to add H2 blocker at bedtime every night to help with her GERD which does seem to be worse while she is laying down in bed. She will continue proton pump inhibitor twice daily. We will proceed with EGD at her soonest convenience to evaluate her chronic intermittent dysphagia.

## 2012-03-20 ENCOUNTER — Emergency Department (INDEPENDENT_AMBULATORY_CARE_PROVIDER_SITE_OTHER)
Admission: EM | Admit: 2012-03-20 | Discharge: 2012-03-20 | Disposition: A | Discharge status: Home / Self Care | Payer: Medicaid Other | Source: Home / Self Care

## 2012-03-20 ENCOUNTER — Other Ambulatory Visit (HOSPITAL_COMMUNITY)
Admission: RE | Admit: 2012-03-20 | Discharge: 2012-03-20 | Disposition: A | Discharge status: Home / Self Care | Payer: Medicaid Other | Source: Ambulatory Visit | Attending: Emergency Medicine | Admitting: Emergency Medicine

## 2012-03-20 ENCOUNTER — Encounter (HOSPITAL_COMMUNITY): Payer: Self-pay | Admitting: *Deleted

## 2012-03-20 DIAGNOSIS — N760 Acute vaginitis: Secondary | ICD-10-CM | POA: Insufficient documentation

## 2012-03-20 DIAGNOSIS — R51 Headache: Secondary | ICD-10-CM

## 2012-03-20 DIAGNOSIS — K130 Diseases of lips: Secondary | ICD-10-CM

## 2012-03-20 DIAGNOSIS — B9689 Other specified bacterial agents as the cause of diseases classified elsewhere: Secondary | ICD-10-CM

## 2012-03-20 DIAGNOSIS — Z113 Encounter for screening for infections with a predominantly sexual mode of transmission: Secondary | ICD-10-CM | POA: Insufficient documentation

## 2012-03-20 DIAGNOSIS — A499 Bacterial infection, unspecified: Secondary | ICD-10-CM

## 2012-03-20 DIAGNOSIS — N898 Other specified noninflammatory disorders of vagina: Secondary | ICD-10-CM

## 2012-03-20 HISTORY — DX: Migraine, unspecified, not intractable, without status migrainosus: G43.909

## 2012-03-20 HISTORY — DX: Nonalcoholic steatohepatitis (NASH): K75.81

## 2012-03-20 HISTORY — DX: Unspecified cirrhosis of liver: K74.60

## 2012-03-20 LAB — POCT URINALYSIS DIP (DEVICE)
Bilirubin Urine: NEGATIVE
Glucose, UA: NEGATIVE mg/dL
Hgb urine dipstick: NEGATIVE
Ketones, ur: NEGATIVE mg/dL
Nitrite: NEGATIVE

## 2012-03-20 MED ORDER — KETOROLAC TROMETHAMINE 30 MG/ML IJ SOLN
INTRAMUSCULAR | Status: AC
  Filled 2012-03-20: qty 1

## 2012-03-20 MED ORDER — FLUCONAZOLE 150 MG PO TABS: ORAL_TABLET | ORAL | Status: DC

## 2012-03-20 MED ORDER — KETOROLAC TROMETHAMINE 30 MG/ML IJ SOLN
30.0000 mg | Freq: Once | INTRAMUSCULAR | Status: AC
  Administered 2012-03-20: 30 mg via INTRAMUSCULAR

## 2012-03-20 MED ORDER — METRONIDAZOLE 500 MG PO TABS: 500.0000 mg | ORAL_TABLET | Freq: Two times a day (BID) | ORAL | Status: DC

## 2012-03-20 NOTE — ED Notes (Signed)
States ran out of all meds 9 days ago (except clonidine - has been out x 6 months).  3 days ago started with HA, body aches, vomiting (able to keep down PO fluids), and "just not feeling right".  Has chronic stomach pain - states it is no different than usual.  Noticed malodorous vaginal discharge 3 days ago as well.

## 2012-03-20 NOTE — ED Provider Notes (Signed)
Medical screening examination/treatment/procedure(s) were performed by non-physician practitioner and as supervising physician I was immediately available for consultation/collaboration.  Leslee Home, M.D.   Reuben Likes, MD 03/20/12 [phone]

## 2012-03-20 NOTE — ED Notes (Signed)
Updated on wait.  Patient verbalized understanding.

## 2012-03-20 NOTE — ED Provider Notes (Signed)
History     CSN: [id number]  Arrival date & time 03/20/12  1146   None     Chief Complaint  Patient presents with  . Headache  . Generalized Body Aches  . Emesis  . Vaginal Discharge    (Consider location/radiation/quality/duration/timing/severity/associated sxs/prior treatment) HPI Comments: This 46 year old female presents with a chief complaint of right-sided headache for 3 days. Is also associated with muscular pain in the shoulders and paracervical musculature. This morning she vomited once but no vomiting since. She also has a vaginal discharge with an odor. She has a history of chronic headaches and takes Topamax. She has been out of all of her medicines for several weeks except for the clonidine for which she has been out one week. This is due to a problem with Medicaid. She denies problems with vision speech or hearing but has a chronic problem with swallowing for which she is in the next of a workup. She denies focal paresthesias or weakness, incontinence or other focal neurologic signs or symptoms.   Past Medical History  Diagnosis Date  . Cystic acne   . 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 // AFP 15 (03/2009)  // Was changed from azathioprine bc unable to achieve remission --> now chronically on Cellcept and prednisone since May 2011// Followed by Dr . Christella Hartigan ([facility]), and now with Dr. Larey Dresser ([facility])  . 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 Perimenopausal   . Macrocytosis without anemia     BL MCV 102-111. Unclear cause. Possibly due to hypothyroidism vs liver disease (although no clear history of alcohol abuse)  // RBC folate and B12 are within normal limits  . Tobacco abuse   . HTN (hypertension)   . GERD (gastroesophageal reflux disease)    EGD (10/2010) - showing mild gastritis, bx negative - by Dr. Christella Hartigan  . Headache     tension with features of migraine  . History of rectal bleeding     2/2 hemorrhoids, colonoscopy 2008 --> next due 2018  . Hemorrhoids     Small internal and external hemorrhoids noted on colonoscopy (05/2006) - thought to be the cause of the rectal bleeding that she noted.   . Liver cirrhosis secondary to NASH (nonalcoholic steatohepatitis)   . Migraines     Past Surgical History  Procedure Date  . Cholecystectomy 1992    laparoscopically   . Ercp   . Colonoscopy   . Tubal ligation     fibroid tumor removed from left fallopian tube  . Carpal tunnel release     Family History  Problem Relation Age of Onset  . Diabetes Mother   . Migraines Mother   . Cancer Brother     History  Substance Use Topics  . Smoking status: Current Every Day Smoker -- 0.5 packs/day for 3 years    Types: Cigarettes  . Smokeless tobacco: Never Used     Comment: 3-4cigs/day.  Cutting back  . Alcohol Use: No    OB History    Grav Para Term Preterm Abortions TAB SAB Ect Mult Living                  Review of Systems  Constitutional: Positive for activity change.  Negative for fever and chills.  HENT: Positive for congestion. Negative for ear pain, sore throat, facial swelling, rhinorrhea, neck pain, neck stiffness, postnasal drip and ear discharge.   Respiratory: Negative.   Cardiovascular: Negative for chest pain.  Gastrointestinal: Positive for vomiting.       Once this morning as stated above  Genitourinary: Positive for vaginal discharge. Negative for dysuria, frequency, vaginal bleeding, vaginal pain, menstrual problem and pelvic pain.  Skin: Negative.   Neurological: Positive for weakness and headaches. Negative for tremors, seizures, syncope, speech difficulty and numbness.    Allergies  Aspirin and Latex  Home Medications   Current Outpatient Rx  Name  Route  Sig  Dispense  Refill  . VITAMIN D  1000 UNITS PO TABS   Oral   Take 1,000 Units by mouth daily.         Marland Kitchen LISINOPRIL-HYDROCHLOROTHIAZIDE 20-12.5 MG PO TABS   Oral   Take 2 tablets by mouth daily. FOR BLOOD PRESSURE   60 tablet   3     Please cancel all remaining lisinopril and hydroch ...   . MYCOPHENOLATE MOFETIL 250 MG PO CAPS   Oral   Take 1,000 mg by mouth 2 (two) times daily.          Marland Kitchen OMEPRAZOLE 40 MG PO CPDR   Oral   Take 40 mg by mouth 2 (two) times daily before a meal.         . PREDNISONE 10 MG PO TABS   Oral   Take 1 tablet (10 mg total) by mouth daily.   30 tablet   0   . TOPIRAMATE 15 MG PO CPSP   Oral   Take 15 mg by mouth as needed.         . ALLERGY 10 MG PO TABS      TAKE 1 TABLET (10 MG TOTAL) BY MOUTH DAILY AS NEEDED(ALLERGIES)   30 tablet   0   . CLONIDINE HCL 0.1 MG PO TABS   Oral   Take 0.2 mg by mouth at bedtime.         Marland Kitchen FLUCONAZOLE 150 MG PO TABS      1 tab po x 1. May repeat in 72 hours if no improvement   2 tablet   0   . METRONIDAZOLE 500 MG PO TABS   Oral   Take 1 tablet (500 mg total) by mouth 2 (two) times daily. X 7 days   14 tablet   0     BP 128/72  Pulse 74  Temp 98.1 F (36.7 C) (Oral)  Resp 18  SpO2 100%  Physical Exam  Nursing note and vitals reviewed. Constitutional: She is oriented to person, place, and time. She appears well-nourished.       Orbitally obese  HENT:  Right Ear: External ear normal.       Bilateral TMs are pearly gray transparent without erythema or effusions. Oropharynx is clear without exudates.  Eyes: Conjunctivae normal and EOM are normal. Pupils are equal, round, and reactive to light.  Neck: Normal range of motion. Neck supple.  Cardiovascular: Normal rate, regular rhythm and normal heart sounds.   Pulmonary/Chest: Effort normal and breath sounds normal. No respiratory distress.  Abdominal: Soft. There is tenderness.       Tenderness is chronic do to unspecified chronic condition per patient.    Genitourinary:       There is intertrigo in the vault but in perineum and buttocks. Scant  cottage cheese type vaginal discharge. Cervix is midline, posterior and without lesions.  Musculoskeletal: She exhibits no edema.  Lymphadenopathy:    She has no cervical adenopathy.  Neurological: She is alert and oriented to person, place, and time. She exhibits normal muscle tone.  Skin: Skin is warm and dry. No rash noted.  Psychiatric: She has a normal mood and affect.    ED Course  Procedures (including critical care time)   Labs Reviewed  POCT URINALYSIS DIP (DEVICE)  CERVICOVAGINAL ANCILLARY ONLY   No results found.   1. Headache   2. Vaginal discharge   3. BV (bacterial vaginosis)   4. Intertrigo labialis       MDM  GC Chlamydia and wet prep obtained. Flagyl 500 mg twice a day for 7 days Toradol 30 mg IM in the office Try to refill your medications as soon as possible I believe that some of her aches and pains and headache are due to  the absence of her medications.        Hayden Rasmussen, NP 03/20/12 1549

## 2012-03-22 NOTE — ED Notes (Signed)
GC/Chlamydia neg., Wet prep: Gardnerella pos.  Pt. adequately treated with Flagyl. Vassie Moselle 03/22/2012

## 2012-04-04 ENCOUNTER — Telehealth: Payer: Self-pay | Admitting: *Deleted

## 2012-04-04 NOTE — Telephone Encounter (Signed)
Spoke with patient. She had questions regarding EGD instructions. Explained to patient to take all prescription medication in the morning by 6 Am except Lisinopril-HCT. Explained to patient no solid food after midnight tonight and no clear liquids in the morning UNTIL 6 am. Nothing by mouth after 6 am. Patient understands. No further questions at this time. Robbin Myers,RN spoke with patient.

## 2012-04-05 ENCOUNTER — Encounter: Payer: Medicaid Other | Admitting: Gastroenterology

## 2012-04-05 ENCOUNTER — Telehealth: Payer: Self-pay | Admitting: Gastroenterology

## 2012-04-05 NOTE — Telephone Encounter (Signed)
Do you want to charge the pt Dr Christella Hartigan?

## 2012-04-05 NOTE — Telephone Encounter (Signed)
See alternate phone note  

## 2012-04-05 NOTE — Telephone Encounter (Signed)
Bill pt per Dr Christella Hartigan

## 2012-04-05 NOTE — Telephone Encounter (Signed)
yes

## 2012-04-06 ENCOUNTER — Encounter: Payer: Self-pay | Admitting: Internal Medicine

## 2012-04-08 ENCOUNTER — Other Ambulatory Visit: Payer: Self-pay | Admitting: Internal Medicine

## 2012-04-15 ENCOUNTER — Telehealth: Payer: Self-pay | Admitting: Gastroenterology

## 2012-04-15 ENCOUNTER — Encounter: Payer: Self-pay | Admitting: Internal Medicine

## 2012-04-15 ENCOUNTER — Ambulatory Visit (INDEPENDENT_AMBULATORY_CARE_PROVIDER_SITE_OTHER): Payer: Medicaid Other | Admitting: Internal Medicine

## 2012-04-15 ENCOUNTER — Other Ambulatory Visit (HOSPITAL_COMMUNITY)
Admission: RE | Admit: 2012-04-15 | Discharge: 2012-04-15 | Disposition: A | Discharge status: Home / Self Care | Payer: Medicaid Other | Source: Ambulatory Visit | Attending: Internal Medicine | Admitting: Internal Medicine

## 2012-04-15 VITALS — BP 151/97 | HR 70 | Temp 96.6°F | Ht 61.0 in | Wt 190.8 lb

## 2012-04-15 DIAGNOSIS — E876 Hypokalemia: Secondary | ICD-10-CM | POA: Insufficient documentation

## 2012-04-15 DIAGNOSIS — R739 Hyperglycemia, unspecified: Secondary | ICD-10-CM

## 2012-04-15 DIAGNOSIS — K754 Autoimmune hepatitis: Secondary | ICD-10-CM

## 2012-04-15 DIAGNOSIS — N898 Other specified noninflammatory disorders of vagina: Secondary | ICD-10-CM

## 2012-04-15 DIAGNOSIS — Z79899 Other long term (current) drug therapy: Secondary | ICD-10-CM

## 2012-04-15 DIAGNOSIS — R109 Unspecified abdominal pain: Secondary | ICD-10-CM | POA: Insufficient documentation

## 2012-04-15 DIAGNOSIS — B9689 Other specified bacterial agents as the cause of diseases classified elsewhere: Secondary | ICD-10-CM

## 2012-04-15 DIAGNOSIS — N760 Acute vaginitis: Secondary | ICD-10-CM | POA: Insufficient documentation

## 2012-04-15 DIAGNOSIS — I10 Essential (primary) hypertension: Secondary | ICD-10-CM

## 2012-04-15 DIAGNOSIS — Z113 Encounter for screening for infections with a predominantly sexual mode of transmission: Secondary | ICD-10-CM | POA: Insufficient documentation

## 2012-04-15 LAB — HEPATIC FUNCTION PANEL
AST: 552 U/L — ABNORMAL HIGH (ref 0–37)
Bilirubin, Direct: 1.5 mg/dL — ABNORMAL HIGH (ref 0.0–0.3)
Indirect Bilirubin: 1.1 mg/dL — ABNORMAL HIGH (ref 0.0–0.9)
Total Bilirubin: 2.6 mg/dL — ABNORMAL HIGH (ref 0.3–1.2)

## 2012-04-15 LAB — BASIC METABOLIC PANEL
BUN: 11 mg/dL (ref 6–23)
CO2: 29 mEq/L (ref 19–32)
Calcium: 9.3 mg/dL (ref 8.4–10.5)
Glucose, Bld: 98 mg/dL (ref 70–99)
Potassium: 2.9 mEq/L — ABNORMAL LOW (ref 3.5–5.3)
Sodium: 140 mEq/L (ref 135–145)

## 2012-04-15 MED ORDER — METRONIDAZOLE 500 MG PO TABS: 500.0000 mg | ORAL_TABLET | Freq: Two times a day (BID) | ORAL | Status: DC

## 2012-04-15 MED ORDER — METRONIDAZOLE 250 MG PO TABS: 250.0000 mg | ORAL_TABLET | Freq: Two times a day (BID) | ORAL | Status: AC

## 2012-04-15 MED ORDER — LISINOPRIL 40 MG PO TABS: 40.0000 mg | ORAL_TABLET | Freq: Every day | ORAL | Status: DC

## 2012-04-15 MED ORDER — PREDNISONE 20 MG PO TABS: 40.0000 mg | ORAL_TABLET | Freq: Every day | ORAL | Status: DC

## 2012-04-15 MED ORDER — POTASSIUM CHLORIDE CRYS ER 20 MEQ PO TBCR: 20.0000 meq | EXTENDED_RELEASE_TABLET | Freq: Every day | ORAL | Status: DC

## 2012-04-15 NOTE — Patient Instructions (Addendum)
General Instructions:  Please follow-up at the clinic in 1 month, at which time we will reevaluate your blood pressure and abdominal pain - OR, please follow-up in the clinic sooner if needed.  There have been changes in your medications:  START Flagyl (metronidazole) for your vaginal infection - take this for 7 days and avoid alcohol when on this medication.  All antibiotics can decrease the effectiveness of birth control, therefore, while on this medication he should use a second form of birth control   Call Dr. Christella Hartigan and Dr. Michaelle Copas office today to get followup visits. Please let them know that you have been out of her medications for 6 weeks and just have restarted. You are getting labs today, if they are abnormal I will give you a call.  If you have been started on new medication(s), and you develop symptoms concerning for allergic reaction, including, but not limited to, throat closing, tongue swelling, rash, please stop the medication immediately and call the clinic at [phone], and go to the ER.  If you are diabetic, please bring your meter to your next visit.  If symptoms worsen, or new symptoms arise, please call the clinic or go to the ER.  PLEASE BRING ALL OF YOUR MEDICATIONS  IN A BAG TO YOUR NEXT APPOINTMENT   Treatment Goals:  Goals (1 Years of Data) as of 04/15/2012    None      Progress Toward Treatment Goals:    Self Care Goals & Plans:  Self Care Goal 04/15/2012  Manage my medications refill my medications on time  Eat healthy foods eat foods that are low in salt  Be physically active take the stairs instead of the elevator  Stop smoking set a quit date and stop smoking; see a smoking cessation counselor       Care Management & Community Referrals:

## 2012-04-15 NOTE — Assessment & Plan Note (Signed)
Assessment: Most likely secondary to bacterial vaginosis, for which she was not treated (never filled the Flagyl that she was prescribed). However, she is sexually active, therefore she is at increased risk for gonorrhea and chlamydia. Pelvic exam was performed today.  Plan:      Wet prep, GC, and Chlamydia samples were sent today.   Will empirically treat with Flagyl, reduce dose of 250 mg twice a day (reduced dose in the setting of her hepatitis) x 7 days.

## 2012-04-15 NOTE — Progress Notes (Signed)
Flagyl rx called to [facility] on [address].

## 2012-04-15 NOTE — Assessment & Plan Note (Addendum)
Pertinent Data:  EGD (10/2010) - mild gastritis, H.pylori negative  Colonoscopy (05/2006) - internal and external hemorrhoids noted  Wet prep (02/2012) - bacterial vaginosis (+)  Assessment: Cause of the patient's abdominal pain remains unclear, thought most likely contributed by her autoimmune hepatitis (worsening in setting of medication noncompliance), although recently she has experienced intermittent dysphagia to solids (for which was initially scheduled for EGD this month, however, was canceled per pt). Her symptoms of early satiety, fullness after meals also raise concern for possible gastroparesis (although no specific associated risk factor is identified). Of note, she denies symptoms of dysuria, hematuria, malodorous urine. She has untreated BV, no cervical motion tenderness of exam to suggest PID.  Plan:      Pelvic exam performed today.  STAT BMET and hepatic function tests obtained.  Advised to call Dr. Christella Hartigan back and get the EGD rescheduled for reevaluation of this intermittent dysphagia with additional consideration of possible gastroparesis.  Instructed to present to the ED immediately over the weekend if she is having worsening abdominal pain, nausea, vomiting, fevers, chills.

## 2012-04-15 NOTE — Assessment & Plan Note (Addendum)
Pertinent Data: BP Readings from Last 3 Encounters:  04/15/12 151/97  03/20/12 128/72  03/07/12 122/80    Basic Metabolic Panel:    Component Value Date/Time   NA 141 01/14/2012 1443   K 3.9 01/14/2012 1443   CL 105 01/14/2012 1443   CO2 26 01/14/2012 1443   BUN 9 01/14/2012 1443   CREATININE 0.84 01/14/2012 1443   CREATININE 0.7 08/25/2011 1057   GLUCOSE 102* 01/14/2012 1443   GLUCOSE 96 05/10/2006 0936   CALCIUM 9.2 01/14/2012 1443    Assessment: Disease Control:   uncontrolled   Progress toward goals:   deteriorated   Barriers to meeting goals: nonadherence to medications - the patient has been out of her clonidine and lisinopril hydrochlorothiazide for the last 6 weeks, and only resumed 4 days ago.     Patient was advised how dangerous this abrupt cessation of her antihypertensives was. She was advised to avoid similar recurrence in the future because of risk of rebound hypertension and its consequences.   After results of stat C. met were obtained, showing hypokalemia with potassium 2.9, the patient was advised to stop her lisinopril hydrochlorothiazide, and to continue only on lisinopril.  A new prescription for lisinopril was called into her pharmacy.   Patient is noncompliant much of the time with prescribed medications.   Plan:  Discontinue lisinopril hydrochlorothiazide  Start lisinopril 40 mg once daily.  Start potassium 20 mEq daily for the next 3 days, will recheck basic metabolic panel on Monday.  Educational resources provided: brochure  Self management tools provided:

## 2012-04-15 NOTE — Assessment & Plan Note (Signed)
Pertinent Data:  Ref. Range 11/25/2011  12/10/2011  01/14/2012  04/15/2012  Sodium Latest Range: 135-145 mEq/L 141 141 141 140  Potassium Latest Range: 3.5-5.3 mEq/L 3.7 3.3 (L) 3.9 2.9  Chloride Latest Range: 96-112 mEq/L 100 103 105 101  CO2 Latest Range: 19-32 mEq/L 29 28 26 29   BUN Latest Range: 6-23 mg/dL 12 14 9 11   Creatinine Latest Range: 0.4-1.2 mg/dL 1.02 7.25 3.66 0.9  Calcium Latest Range: 8.4-10.5 mg/dL 9.9 9.1 9.2 9.3  Glucose Latest Range: 70-99 mg/dL 91 440 (H) 347 (H) 98  Alk Phos Latest Range: 39-117 U/L 194 (H) 150 (H) 136 (H) 338  Albumin Latest Range: 3.5-5.2 g/dL 4.0 3.8 3.8 3.1  AST Latest Range: 0-37 U/L 173 (H) 119 (H) 109 (H) 552  ALT Latest Range: 0-35 U/L 152 (H) 93 (H) 82 (H) 417  Total Protein Latest Range: 6.0-8.3 g/dL 7.8 7.4 7.1 8.3  Total Bilirubin Latest Range: 0.3-1.2 mg/dL 1.4 (H) 1.0 1.2 2.6    Assessment: Patient presented today with her chronic abdominal pain that is remarkably not worse than prior. She has not had worsening nausea, vomiting, mental status changes, fever, chills. She has been out of her cellcept and prednisone over the last 6 weeks and just restarted the medications approximately 4-5 days ago. I called the patient's hepatologist Dr. Larey Dresser at [facility], and discussed with him the above-mentioned. Given the patient's worsening liver function, Dr. Katrinka Blazing recommended to increase the patient's prednisone to 40 mg daily until followup with him in early January. The patient was called and informed of this information.   Plan:      Increase prednisone to 40 mg daily.   Dr. Michaelle Copas office will call the patient to set up an appointment in the first or second week of January.   Patient was instructed to strictly follow the medication regimen set forth to her.   The patient was instructed to report to the ER immediately over the weekend if she develops severe nausea, vomiting, increased severity of abdominal pain, confusion. Patient expresses  understanding of the information provided.

## 2012-04-15 NOTE — Progress Notes (Signed)
Patient: Alexandria White   MRN: [id number]  DOB: [birth year]  PCP: Elyn Peers, DO   Subjective:    HPI: Ms. Alexandria White is a 46 y.o. female with a PMHx of autoimmune hepatitis with very intermittent followup (followed at [facility] and locally by Dr. Christella Hartigan of [facility]), hypertension, and hyperlipidemia who presented to clinic today for the following:  1) Abdominal pain - Patient describes a 3 years history of unchanged, cramping and stabbing epigastric, diffuse over periumbilical area pain that does not radiate. Aggravating factors include: Laying flat at night after she's eaten.  Alleviating factors include: antacids and proton pump inhibitors. Associated symptoms include: belching and early satiety, fullness after she eats, brash sensation in mouth. She intermittently has had an some nausea, and vomiting approximately a week ago that has since. The patient denies anorexia, constipation, dysuria, change in frequency of urination,  hematochezia and hematuria.  2) Vaginal discharge - patient complains of copious and curd-like vaginal discharge for 1 week(s). She was seen in the ED on 03/20/2012, at which time she was diagnosed with bacterial vaginosis and was prescribed Flagyl, however she did not fill this prescription. She was also prescribed Diflucan at that time which she also did not fill. She is sexually active and does not use condoms. No severe pelvic pain, dysuria, hematuria, fevers, chills.  3) HTN - Patient does not check blood pressure regularly at home. She has been out of her lisinopril hydrochlorothiazide and her clonidine for the last 6 weeks, and only was able to fill this medication approximately 5 days ago. She was unaware that she needed to be taking her lisinopril hydrochlorothiazide as 2 pills daily, therefore also has never increased the dosage as we previously discussed. Currently taking clonidine 0.2 mg each bedtime and lisinopril-hydrochlorothiazide 20-12.52  pills daily (this just started yesterday). denies headaches, dizziness, lightheadedness, chest pain, shortness of breath.  does not request refills today.  4) Medication noncompliance - patient indicates that she has not had any of her medications for the last 6 weeks, and only got them about or to 5 days ago. She did not think to inform any of her medical providers of this issue. She was trying her best to try to pay for the medications but was unable to do so. She now has Medicaid therefore wants everything to be addressed again.   Review of Systems: Per HPI.   Current Outpatient Medications: Medication Sig  . ALLERGY 10 MG tablet TAKE 1 TABLET AS NEEDED  . cholecalciferol (VITAMIN D) 1000 UNITS tablet Take 1,000 Units by mouth daily.  . cloNIDine (CATAPRES) 0.1 MG tablet Take 0.2 mg by mouth at bedtime.  Marland Kitchen lisinopril-hydrochlorothiazide (ZESTORETIC) 20-12.5 MG per tablet Take 2 tablets by mouth daily. FOR BLOOD PRESSURE  . mycophenolate (CELLCEPT) 250 MG capsule Take 1,000 mg by mouth 2 (two) times daily.   Marland Kitchen omeprazole (PRILOSEC) 40 MG capsule Take 40 mg by mouth 2 (two) times daily before a meal.  . predniSONE (DELTASONE) 10 MG tablet Take 1 tablet (10 mg total) by mouth daily.  Marland Kitchen topiramate (TOPAMAX) 15 MG capsule Take 15 mg by mouth as needed.     Allergies  Allergen Reactions  . Aspirin     Stomach pain  . Latex Rash    Past Medical History  Diagnosis Date  . Cystic acne   . Autoimmune hepatitis DX: Sept 2010    Initial labs (12/2008) - ANA [name] neg, F actin / IgG Ab high at  11 // Cirrhosis on index liver bx (03/2009) and at least bridging fibrosis on 2nd biopsy in 01/2010. // AFP 15 (03/2009)  // Was changed from azathioprine bc unable to achieve remission --> now chronically on Cellcept and prednisone since May 2011// Followed by Dr . Christella Hartigan ([facility]), and now with Dr. Larey Dresser ([facility])  . Hashimoto's thyroiditis     hx of  // Thyroperoxidase antibody positive / high  (07/2006)  . Perimenopausal   . Macrocytosis without anemia     BL MCV 102-111. Unclear cause. Possibly due to hypothyroidism vs liver disease (although no clear history of alcohol abuse)  // RBC folate and B12 are within normal limits  . Tobacco abuse   . HTN (hypertension)   . GERD (gastroesophageal reflux disease)     EGD (10/2010) - showing mild gastritis, bx negative - by Dr. Christella Hartigan  . Headache     tension with features of migraine  . History of rectal bleeding     2/2 hemorrhoids, colonoscopy 2008 --> next due 2018  . Hemorrhoids     Small internal and external hemorrhoids noted on colonoscopy (05/2006) - thought to be the cause of the rectal bleeding that she noted.   . Liver cirrhosis secondary to NASH (nonalcoholic steatohepatitis)   . Migraines     Past Surgical History  Procedure Date  . Cholecystectomy 1992    laparoscopically   . Ercp   . Colonoscopy   . Tubal ligation     fibroid tumor removed from left fallopian tube  . Carpal tunnel release      Objective:    Physical Exam: Filed Vitals:   04/15/12 1348  BP: 151/97  Pulse: 70  Temp: 96.6 F (35.9 C)     General: Vital signs reviewed and noted. Well-developed, well-nourished, in no acute distress; alert, appropriate and cooperative throughout examination.  Head: Normocephalic, atraumatic.  Eyes: conjunctivae icteric laterally PERRL.  Ears: TM nonerythematous, not bulging, good light reflex bilaterally.  Nose: Mucous membranes moist, not inflammed, nonerythematous.  Throat: Oropharynx nonerythematous, no exudate appreciated. Underneath the patient's tongue, evidence of jaundice.   Neck: No deformities, masses, or tenderness noted.  Lungs:  Normal respiratory effort. Clear to auscultation BL without crackles or wheezes.  Heart: RRR. S1 and S2 normal without gallop, rubs. (+) murmur.  Abdomen:  BS normoactive. Soft, Nondistended, no fluid shift. Mild left lower Corcoran tenderness to palpation. No rebound  tenderness.  No masses or organomegaly.  Extremities: No pretibial edema.  Pelvic:  External genitalia: Without external lesions appreciated. Vagina: normal appearing vagina with normal color and discharge, no lesions Cervix: Not friable, cervical discharge present - white and clumpy, cervical motion tenderness absent Adnexa: normal adnexa in size, nontender and no masses PAP: Pap smear not performed Exam was chaperoned by Chinita Pester     Assessment/ Plan:   Case and plan of care discussed with attending physician, Dr. Jonah Blue.    Greater than 50 minutes spent with the patient with > 50% of time spent in direct face-to-face counseling.

## 2012-04-16 NOTE — Progress Notes (Signed)
Quick Note:  Liver function worsening in setting her noncompliance with prednisone and cellcept. Will increase dose of prednisone per rec of her hepatologist. Will stop HCTZ and continue single lisinopril only. Will supplement with K for next 3 days and have her return on Monday for repeat BMET. ______

## 2012-04-18 NOTE — Telephone Encounter (Signed)
Pt has been scheduled for previsit and EGD 

## 2012-04-22 ENCOUNTER — Ambulatory Visit: Payer: Self-pay | Admitting: Internal Medicine

## 2012-05-04 ENCOUNTER — Other Ambulatory Visit: Payer: Self-pay

## 2012-05-04 DIAGNOSIS — K7460 Unspecified cirrhosis of liver: Secondary | ICD-10-CM

## 2012-05-04 MED ORDER — OMEPRAZOLE 40 MG PO CPDR: 40.0000 mg | DELAYED_RELEASE_CAPSULE | Freq: Two times a day (BID) | ORAL | Status: DC

## 2012-05-11 ENCOUNTER — Ambulatory Visit (AMBULATORY_SURGERY_CENTER): Payer: Medicaid Other | Admitting: Gastroenterology

## 2012-05-11 ENCOUNTER — Encounter: Payer: Self-pay | Admitting: Gastroenterology

## 2012-05-11 ENCOUNTER — Encounter: Payer: Medicaid Other | Admitting: Gastroenterology

## 2012-05-11 VITALS — BP 153/97 | HR 64 | Temp 97.4°F | Resp 15 | Ht 61.5 in | Wt 191.0 lb

## 2012-05-11 DIAGNOSIS — R1310 Dysphagia, unspecified: Secondary | ICD-10-CM

## 2012-05-11 DIAGNOSIS — K2990 Gastroduodenitis, unspecified, without bleeding: Secondary | ICD-10-CM

## 2012-05-11 DIAGNOSIS — K2960 Other gastritis without bleeding: Secondary | ICD-10-CM

## 2012-05-11 DIAGNOSIS — K222 Esophageal obstruction: Secondary | ICD-10-CM

## 2012-05-11 DIAGNOSIS — K2970 Gastritis, unspecified, without bleeding: Secondary | ICD-10-CM

## 2012-05-11 MED ORDER — SODIUM CHLORIDE 0.9 % IV SOLN: 500.0000 mL | INTRAVENOUS | Status: DC

## 2012-05-11 MED ORDER — OMEPRAZOLE 40 MG PO CPDR: 40.0000 mg | DELAYED_RELEASE_CAPSULE | Freq: Two times a day (BID) | ORAL | Status: DC

## 2012-05-11 NOTE — Patient Instructions (Addendum)

## 2012-05-11 NOTE — Progress Notes (Signed)
Called to room to assist during endoscopic procedure.  Patient ID and intended procedure confirmed with present staff. Received instructions for my participation in the procedure from the performing physician.  

## 2012-05-11 NOTE — Op Note (Signed)
[facility] [address] [place], [address]   ENDOSCOPY PROCEDURE REPORT  PATIENT: Alexandria White, Alexandria White  MR#: [id number] BIRTHDATE: Sep 21, 1965 , 46  yrs. old GENDER: Female ENDOSCOPIST: Rachael Fee, MD PROCEDURE DATE:  05/11/2012 PROCEDURE:  EGD w/ biopsy  , EGD with balloon dilation ASA CLASS:     Class IV INDICATIONS:  dysphagia. MEDICATIONS: propofol (Diprivan) 300mg  IV TOPICAL ANESTHETIC: Cetacaine Spray  DESCRIPTION OF PROCEDURE: After the risks benefits and alternatives of the procedure were thoroughly explained, informed consent was obtained.  The [facility] GIF-H180 [id number] endoscope was introduced through the mouth and advanced to the second portion of the duodenum. Without limitations.  The instrument was slowly withdrawn as the mucosa was fully examined.    There was mild, non-specific distal gastritis.  The GE junction was slightly snug without any mucosal abnormality and was dilated with CRE TTS balloon held inflated to 20mm for about 1 minute.  There was no mucosal tear or bleeding following dilation.  Retroflexed views revealed no abnormalities.     The scope was then withdrawn from the patient and the procedure completed.  COMPLICATIONS: There were no complications. ENDOSCOPIC IMPRESSION: There was mild, non-specific distal gastritis.  The GE junction was slightly snug without any mucosal abnormality and was dilated with CRE TTS balloon held inflated to 20mm for about 1 minute.  There was no mucosal tear or bleeding following dilation.  RECOMMENDATIONS: Observe clinically.  Continue twice daily PPI (new script was called into your pharmacy today).  Chew your food well, eat slowly, take small bites.  If biopsies + for H.  pylori, then you will be started on the correct antibiotics.  Continue taking your Autoimmune Hepatitis meds as recommended by Dr.  Katrinka Blazing from [facility].    eSigned:  Rachael Fee, MD 05/11/2012 9:54 AM   CC: Iva Boop, MD ([facility])

## 2012-05-11 NOTE — Progress Notes (Signed)
Patient did not experience any of the following events: a burn prior to discharge; a fall within the facility; wrong site/side/patient/procedure/implant event; or a hospital transfer or hospital admission upon discharge from the facility. (G8907) Patient did not have preoperative order for IV antibiotic SSI prophylaxis. (G8918)  

## 2012-05-12 ENCOUNTER — Telehealth: Payer: Self-pay | Admitting: *Deleted

## 2012-05-12 NOTE — Telephone Encounter (Signed)
Number identifier, left message, follow-up  

## 2012-05-23 ENCOUNTER — Other Ambulatory Visit: Payer: Self-pay | Admitting: Internal Medicine

## 2012-05-23 ENCOUNTER — Encounter: Payer: Self-pay | Admitting: Gastroenterology

## 2012-05-23 NOTE — Telephone Encounter (Signed)
Please tell her again, that I do not manage her hepatitis and she needs to be seeing [facility] for this issue, and havign them prescribe her prednisone.  Thank you! - Johnette Abraham, D.O., 05/23/2012, 5:07 PM

## 2012-05-24 ENCOUNTER — Other Ambulatory Visit: Payer: Self-pay | Admitting: Internal Medicine

## 2012-05-24 NOTE — Telephone Encounter (Signed)
[facility] and pt informed of refusal. Pt knows she needs to call hepatologist at [facility].

## 2012-06-08 ENCOUNTER — Other Ambulatory Visit: Payer: Self-pay | Admitting: Internal Medicine

## 2012-06-27 ENCOUNTER — Other Ambulatory Visit: Payer: Self-pay | Admitting: Internal Medicine

## 2012-08-03 ENCOUNTER — Encounter: Payer: Self-pay | Admitting: Internal Medicine

## 2012-08-04 ENCOUNTER — Encounter: Payer: Medicaid Other | Admitting: Internal Medicine

## 2012-08-09 ENCOUNTER — Emergency Department (INDEPENDENT_AMBULATORY_CARE_PROVIDER_SITE_OTHER): Payer: Medicaid Other

## 2012-08-09 ENCOUNTER — Emergency Department (INDEPENDENT_AMBULATORY_CARE_PROVIDER_SITE_OTHER)
Admission: EM | Admit: 2012-08-09 | Discharge: 2012-08-09 | Disposition: A | Discharge status: Home / Self Care | Payer: Medicaid Other | Source: Home / Self Care | Attending: Emergency Medicine | Admitting: Emergency Medicine

## 2012-08-09 ENCOUNTER — Other Ambulatory Visit: Payer: Self-pay | Admitting: Internal Medicine

## 2012-08-09 ENCOUNTER — Encounter (HOSPITAL_COMMUNITY): Payer: Self-pay | Admitting: *Deleted

## 2012-08-09 DIAGNOSIS — S93402A Sprain of unspecified ligament of left ankle, initial encounter: Secondary | ICD-10-CM

## 2012-08-09 DIAGNOSIS — S93409A Sprain of unspecified ligament of unspecified ankle, initial encounter: Secondary | ICD-10-CM

## 2012-08-09 MED ORDER — HYDROCODONE-ACETAMINOPHEN 5-325 MG PO TABS: ORAL_TABLET | ORAL | Status: DC

## 2012-08-09 MED ORDER — ACETAMINOPHEN 325 MG PO TABS
650.0000 mg | ORAL_TABLET | Freq: Once | ORAL | Status: AC
  Administered 2012-08-09: 650 mg via ORAL

## 2012-08-09 MED ORDER — ACETAMINOPHEN 325 MG PO TABS
ORAL_TABLET | ORAL | Status: AC
  Filled 2012-08-09: qty 2

## 2012-08-09 NOTE — ED Notes (Signed)
Pt  Reports  She  Injured  Her  l  Ankle   About  1  Week  Ago  She  Has  Pain /  Swelling to the  Affected  Area    She   Has  Been  Ambulating  On the  Area      She  Has  Some  Swelling

## 2012-08-09 NOTE — ED Provider Notes (Signed)
Chief Complaint:   Chief Complaint  Patient presents with  . Ankle Pain    History of Present Illness:   Alexandria White is a 47 year old female who twisted her left ankle 9 days ago. She felt a pop, it's been swollen since then it hurts to bear weight. The swelling and pain are localized over the lateral malleolus. She denies any numbness, tingling, or weakness. 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  Review of Systems:  Other than noted above, the patient denies any of the following symptoms: Systemic:  No fevers, chills, sweats, or aches.   Musculoskeletal:  No joint pain, arthritis, bursitis, swelling, back pain, or neck pain. Neurological:  No muscular weakness or paresthesias.  PMFSH:  Past medical history, family history, social history, meds, and allergies were reviewed. She is allergic to latex and aspirin. She takes CellCept, prednisone, lisinopril, omeprazole, vitamin D, and clonidine. She has autoimmune hepatitis and hypertension. She has never used alcohol.  Physical Exam:   Vital signs:  BP 155/95  Pulse 82  Temp(Src) 97.4 F (36.3 C) (Oral)  Resp 16  SpO2 97% Gen:  Alert and oriented times 3.  In no distress. Musculoskeletal: Exam of the ankle reveals pain to palpation just below the lateral malleolus with slight swelling. Anterior drawer sign was negative.  Talar tilt negative. Squeeze test negative. Achilles tendon, peroneal tendon, and tibialis posterior were intact. Otherwise, all joints had a full a ROM with no swelling, bruising or deformity.  No edema, pulses full. Extremities were warm and pink.  Capillary refill was brisk.  Skin:  Clear, warm and dry.  No rash. Neuro:  Alert and oriented times 3.  Muscle strength was normal.  Sensation was intact to light touch.   Radiology:  Dg Ankle Complete Left  08/09/2012  *RADIOLOGY REPORT*  Clinical Data: Twisted ankle  LEFT ANKLE COMPLETE - 3+ VIEW  Comparison: None.  Findings:  Negative for fracture.  Normal ankle joint.  There is a small ankle joint effusion.  There is a small ossicle adjacent to the tip of the fibula.  Small calcifications in the soft tissues lateral to the fibula.  There is lateral soft tissue swelling.  IMPRESSION: Negative for fracture.  Small joint effusion.   Original Report Authenticated By: Janeece Riggers, M.D.    I reviewed the images independently and personally and concur with the radiologist's findings.  Course in Urgent Care Center:   Placed in an ASO brace and will use crutches from home.  Assessment:  The encounter diagnosis was Ankle sprain, left, initial encounter.  Plan:   1.  The following meds were prescribed:   Discharge Medication List as of 08/09/2012 12:12 PM    START taking these medications   Details  HYDROcodone-acetaminophen (NORCO/VICODIN) 5-325 MG per tablet 1 to 2 tabs every 4 to 6 hours as needed for pain., Print       2.  The patient was instructed in symptomatic care, including rest and activity, elevation, application of ice and compression.  Appropriate handouts were given. 3.  The patient was told to return if becoming worse in any way, if no better in 3 or 4 days, and given some red flag symptoms such as increasing pain or neurological symptoms that would indicate earlier return.   4.  The patient was told to follow up with Dr. frank Alusio if no better in 2 weeks.    Reuben Likes, MD 08/09/12 2051

## 2012-08-18 ENCOUNTER — Ambulatory Visit (INDEPENDENT_AMBULATORY_CARE_PROVIDER_SITE_OTHER): Payer: Medicaid Other | Admitting: Internal Medicine

## 2012-08-18 ENCOUNTER — Encounter: Payer: Self-pay | Admitting: Internal Medicine

## 2012-08-18 VITALS — BP 168/121 | HR 101 | Temp 97.6°F | Ht 61.0 in | Wt 203.3 lb

## 2012-08-18 DIAGNOSIS — K625 Hemorrhage of anus and rectum: Secondary | ICD-10-CM

## 2012-08-18 DIAGNOSIS — I10 Essential (primary) hypertension: Secondary | ICD-10-CM

## 2012-08-18 DIAGNOSIS — Z720 Tobacco use: Secondary | ICD-10-CM

## 2012-08-18 DIAGNOSIS — R5381 Other malaise: Secondary | ICD-10-CM

## 2012-08-18 DIAGNOSIS — R5383 Other fatigue: Secondary | ICD-10-CM

## 2012-08-18 DIAGNOSIS — M25572 Pain in left ankle and joints of left foot: Secondary | ICD-10-CM

## 2012-08-18 DIAGNOSIS — F17200 Nicotine dependence, unspecified, uncomplicated: Secondary | ICD-10-CM

## 2012-08-18 DIAGNOSIS — R0602 Shortness of breath: Secondary | ICD-10-CM

## 2012-08-18 DIAGNOSIS — E876 Hypokalemia: Secondary | ICD-10-CM

## 2012-08-18 DIAGNOSIS — E063 Autoimmune thyroiditis: Secondary | ICD-10-CM

## 2012-08-18 DIAGNOSIS — M25579 Pain in unspecified ankle and joints of unspecified foot: Secondary | ICD-10-CM

## 2012-08-18 LAB — COMPREHENSIVE METABOLIC PANEL
AST: 63 U/L — ABNORMAL HIGH (ref 0–37)
Albumin: 3.7 g/dL (ref 3.5–5.2)
BUN: 11 mg/dL (ref 6–23)
CO2: 27 mEq/L (ref 19–32)
Calcium: 9.5 mg/dL (ref 8.4–10.5)
Chloride: 102 mEq/L (ref 96–112)
Creat: 0.91 mg/dL (ref 0.50–1.10)
Potassium: 3.8 mEq/L (ref 3.5–5.3)

## 2012-08-18 LAB — TSH: TSH: 0.62 u[IU]/mL (ref 0.350–4.500)

## 2012-08-18 LAB — CBC
HCT: 39.5 % (ref 36.0–46.0)
Hemoglobin: 13.2 g/dL (ref 12.0–15.0)
MCHC: 33.4 g/dL (ref 30.0–36.0)
RDW: 14.8 % (ref 11.5–15.5)
WBC: 7.7 10*3/uL (ref 4.0–10.5)

## 2012-08-18 LAB — FECAL OCCULT BLOOD, GUAIAC: Fecal Occult Blood: NEGATIVE

## 2012-08-18 NOTE — Assessment & Plan Note (Signed)
Pertinent Data Reviewed:  Left Ankle XR (08/09/2012) - Findings: Negative for fracture. Normal ankle joint. There is a small ankle joint effusion. There is a small ossicle adjacent to the tip of the fibula. Small calcifications in the soft tissues lateral to the fibula. There is lateral soft tissue swelling. IMPRESSION: Negative for fracture. Small joint effusion. Original Report Authenticated By: Janeece Riggers, M.D.  Assessment: Persistent left lateral ankle pain and swelling. Concerning for possible significant contusion versus any ligamental injury vs fracture that was not apparent per initial XR. Initial XR was negative for fracture. No erythema, warmth, fevers, chills to suggest septic joint.  Plan:      Will get repeat XR  Sports medicine referral for continued evaluation.

## 2012-08-18 NOTE — Assessment & Plan Note (Signed)
  Assessment: 1. The patient was counseled on the dangers of tobacco use, which include, but are not limited to cardiovascular disease, increased cancer risk of multiple types of cancer, COPD, peripheral vascular disease, strokes. 2. She was also counseled on the benefits of smoking cessation. 3. Progress toward smoking cessation:   smoking the same amount 4. Barriers to progress toward smoking cessation:  smoking the same amount  Plan: 1. Instruction/counseling given: The patient was firmly advised to quit and referred to a tobacco cessation program.   2. We also reviewed strategies to maximize success, including:  Removing cigarettes and smoking materials from environment  Stress management  Substitution of other forms of reinforcement Support of family/friends.  Selecting a quit date. 3. Educational resources provided: smoking cessation handout (tips, strategies, fact sheets) 4. Self management tools provided:   5. Medications to assist with smoking cessation: None 6. Patient agreed to the following self-care plans for smoking cessation:

## 2012-08-18 NOTE — Assessment & Plan Note (Signed)
Pertinent Data Reviewed: Lab Results  Component Value Date   TSH 0.546 11/25/2011   Assessment: Patient now complaining of fatigue and has chronic ill-described abdominal pain. Given she also has AI hepatitis, will follow her TSH as well to see if possibly contributing factor.  Plan:      Check TSH today.

## 2012-08-18 NOTE — Patient Instructions (Addendum)
General Instructions:  Please follow-up at the clinic in 1 month, at which time we will reevaluate your blood pressure, ankle pain - OR, please follow-up in the clinic sooner if needed.  There have not been changes in your medications - please get your clonidine filled  You are getting labs today, if they are abnormal I will give you a call.  Please get your XR performed. Please see sports medicine as soon as possible.   If you have been started on new medication(s), and you develop symptoms concerning for allergic reaction, including, but not limited to, throat closing, tongue swelling, rash, please stop the medication immediately and call the clinic at [phone], and go to the ER.  If symptoms worsen, or new symptoms arise, please call the clinic or go to the ER.  PLEASE BRING ALL OF YOUR MEDICATIONS  IN A BAG TO YOUR NEXT APPOINTMENT   Treatment Goals:  Goals (1 Years of Data) as of 08/18/12   None      Progress Toward Treatment Goals:  Treatment Goal 08/18/2012  Blood pressure deteriorated    Self Care Goals & Plans:  Self Care Goal 04/15/2012  Manage my medications refill my medications on time  Eat healthy foods eat foods that are low in salt  Be physically active take the stairs instead of the elevator  Stop smoking set a quit date and stop smoking; see a smoking cessation counselor

## 2012-08-18 NOTE — Progress Notes (Signed)
Patient: Alexandria White   MRN: [id number]  DOB: 11-Jan-1966  PCP: Priscella Mann, DO   Subjective:    CC: Ankle Pain, Follow-up and Fatigue   HPI: Ms. Alexandria White is a 47 y.o. female with a PMHx as outlined below, who presented to clinic today for the following:  1) HTN - Patient does not check blood pressure regularly at home. She is supposed to be on Lisinopril 40mg  and Clonidine 0.2mg  daily. She has been out of her clonidine x 4 days. denies headaches, dizziness, lightheadedness, chest pain.  does not request refills today.   2) Tobacco abuse - patient continues to smoke 1/2 cigarettes per day and has done so for the past 30 years. There  have been prior attempts to quit - with successes for 2 years. Barriers to quit (if any) include: habit  3) Left ankle sprain  - patient indicates that she was walking to the mailbox on approximately 07/29/2012, heard a pop, had immediate pain, then swelling next day. She was seen in UC on 4/15, XR was negative for acute fracture. Was recommended to get brace, crutches, and keep leg elevated. States she continues to have persistent pain and swelling, not really improved from prior. Nonerythematous, No fevers, chills.   4) Rectal bleeding - patient indicates that she had history of internal hemorrhoids, confirmed on colonoscopy in 2008. She noted today that she had bright red blood per rectum, that was self-limited and has not recurred. No lightheadedness, dizziness, chest pain.  5) Fatigue - patient indicates recent increased fatigue throughout the day. No recent fevers, chills, new abd pain (changed from her baseline), rashes, brittle hair/ nails, heat/cold intolerance.   Review of Systems: Per HPI.    Current Outpatient Medications: Medication Sig  . cholecalciferol (VITAMIN D) 1000 UNITS tablet Take 1,000 Units by mouth daily.  Marland Kitchen lisinopril (PRINIVIL,ZESTRIL) 40 MG tablet TAKE 1 TABLET BY MOUTH EVERY DAY  . loratadine  (CLARITIN) 10 MG tablet Take 10 mg by mouth daily.  . mycophenolate (CELLCEPT) 250 MG capsule Take 4 capsules by mouth 2 (two) times daily. 4 cap by mouth 2 times a day  . omeprazole (PRILOSEC) 40 MG capsule Take 1 capsule (40 mg total) by mouth 2 (two) times daily before a meal.  . predniSONE (DELTASONE) 5 MG tablet Take 20 mg by mouth daily. Take 4 tablets (20 mg total) by mouth daily.  Marland Kitchen topiramate (TOPAMAX) 15 MG capsule Take 15 mg by mouth as needed.  . cloNIDine (CATAPRES) 0.2 MG tablet Take 0.2 mg by mouth daily. Take 0.2 mg by mouth daily.  Marland Kitchen HYDROcodone-acetaminophen (NORCO/VICODIN) 5-325 MG per tablet 1 to 2 tabs every 4 to 6 hours as needed for pain.    Allergies  Allergen Reactions  . Aspirin     Stomach pain  . Latex Rash    Past Medical History  Diagnosis Date  . Cystic acne   . 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 // AFP 15 (03/2009)  // Was changed from azathioprine bc unable to achieve remission --> now chronically on Cellcept and prednisone since May 2011// Followed by Dr . Christella Hartigan ([facility]), and now with Dr. Larey Dresser ([facility])  . Hashimoto's thyroiditis     hx of  // Thyroperoxidase antibody positive / high (  07/2006)  . Perimenopausal   . Macrocytosis without anemia     BL MCV 102-111. Unclear cause. Possibly due to hypothyroidism vs liver disease (although no clear history of alcohol abuse)  // RBC folate and B12 are within normal limits  . Tobacco abuse   . HTN (hypertension)   . GERD (gastroesophageal reflux disease)     EGD (10/2010) - showing mild gastritis, bx negative - by Dr. Christella Hartigan  . Headache     tension with features of migraine  . History of rectal bleeding     2/2 hemorrhoids, colonoscopy 2008 --> next due 2018  . Hemorrhoids     Small internal and external hemorrhoids noted on  colonoscopy (05/2006) - thought to be the cause of the rectal bleeding that she noted.   . Liver cirrhosis secondary to NASH (nonalcoholic steatohepatitis)   . Migraines     Objective:    Physical Exam: Filed Vitals:   08/18/12 1345  BP: 156/103  Pulse: 101  Temp: 97.6 F (36.4 C)     General: Vital signs reviewed and noted. Well-developed, well-nourished, in no acute distress; alert, appropriate and cooperative throughout examination.  Head: Normocephalic, atraumatic.  Lungs:  Normal respiratory effort. Clear to auscultation BL without crackles or wheezes.  Heart: RRR. S1 and S2 normal without gallop, rubs. (+) systolic murmur.  Abdomen:  BS normoactive. Soft, Nondistended, non-tender.  No masses or organomegaly.  Extremities: No pretibial edema. Left ankle - TTP to left lateral ankle with overlying swelling and without significant erythema or warmth. Tenderness over lateral ankle with resisted and passive supination of foot.   Assessment/ Plan:   The patient's case and plan of care was discussed with attending physician, Dr. Ulyess Mort.

## 2012-08-18 NOTE — Assessment & Plan Note (Addendum)
Pertinent Data: BP Readings from Last 3 Encounters:  08/18/12 168/121  08/09/12 155/95  05/11/12 153/97    Basic Metabolic Panel:    Component Value Date/Time   NA 140 04/15/2012 1442   K 2.9* 04/15/2012 1442   CL 101 04/15/2012 1442   CO2 29 04/15/2012 1442   BUN 11 04/15/2012 1442   CREATININE 0.90 04/15/2012 1442   CREATININE 0.7 08/25/2011 1057   GLUCOSE 98 04/15/2012 1442   GLUCOSE 96 05/10/2006 0936   CALCIUM 9.3 04/15/2012 1442    Assessment: Disease Control: mildly elevated  Progress toward goals: deteriorated  Barriers to meeting goals: nonadherence to medications    The patient continues to have poor medication compliance and poor insight into her medical condition. During her last visit together in December 2013, she had been out of all of her antihypertensives were 6 weeks, however, never contacted me about this issue. Today, she again indicates that she has been out of her clonidine at least for a few days and never got the medication filled because of some issue with refill.   She was  educated about the long-term consequences of poorly controlled hypertension, which include, but are not limited to, increased risk for cardiovascular intervascular disease. She verbally expresses understanding of the information provided.  Patient is noncompliant some of the time with prescribed medications.   CMET today.  Plan:  continue current medications  Will refill clonidine.  Educational resources provided: Lawyer tools provided: home blood pressure logbook

## 2012-08-18 NOTE — Assessment & Plan Note (Signed)
Pertinent Data Reviewed: Basic Metabolic Panel:    Component Value Date/Time   NA 140 04/15/2012 1442   K 2.9* 04/15/2012 1442   CL 101 04/15/2012 1442   CO2 29 04/15/2012 1442   BUN 11 04/15/2012 1442   CREATININE 0.90 04/15/2012 1442   CREATININE 0.7 08/25/2011 1057   GLUCOSE 98 04/15/2012 1442   GLUCOSE 96 05/10/2006 0936   CALCIUM 9.3 04/15/2012 1442   Assessment: During the last visit, the patient was noted to be hypokalemic, was treated with K for a few days, and instructed to RTC for repeat labs - however, she failed to do so. Her hypokalemia may be in the setting of chronic steroid usage, which was increased.  Plan:      Recheck BMET today.

## 2012-08-18 NOTE — Assessment & Plan Note (Signed)
Pertinent Data Reviewed: Vitals - 1 value per visit 08/18/2012 08/09/2012 05/11/2012 04/15/2012  Weight (lb) 203.3  191 190.8   Assessment: Cause of this patient's fatigue is not clear and may be multifactorial contributed by her recent weight gain in the setting of her increased prednisone usage. She also has hx of thyroiditis, therefore, this must be further evaluated. Additionally, she complains of rectal bleeding although orthostatic vital signs are negative. Therefore, must check CBC and assess for stability of her Hgb.   Plan:      Check CBC, TSH today.  Will consider referral to [facility] for nutrition counseling.  No indication of volume overload on exam today.  Instructed to start to pursue low-sodium and low-salt diet.  May consider evaluation for sleep apnea in the future if sx persistent.

## 2012-08-19 ENCOUNTER — Encounter: Payer: Self-pay | Admitting: Family Medicine

## 2012-08-19 ENCOUNTER — Ambulatory Visit (INDEPENDENT_AMBULATORY_CARE_PROVIDER_SITE_OTHER): Payer: Medicaid Other | Admitting: Family Medicine

## 2012-08-19 VITALS — BP 174/123 | Ht 61.0 in | Wt 203.0 lb

## 2012-08-19 DIAGNOSIS — M25572 Pain in left ankle and joints of left foot: Secondary | ICD-10-CM

## 2012-08-19 DIAGNOSIS — I10 Essential (primary) hypertension: Secondary | ICD-10-CM

## 2012-08-19 DIAGNOSIS — M25579 Pain in unspecified ankle and joints of unspecified foot: Secondary | ICD-10-CM | POA: Insufficient documentation

## 2012-08-19 LAB — VITAMIN D 25 HYDROXY (VIT D DEFICIENCY, FRACTURES): Vit D, 25-Hydroxy: 34 ng/mL (ref 30–89)

## 2012-08-19 NOTE — Assessment & Plan Note (Signed)
The patient has what appears to be more of an avulsion fracture that has not been in rehabilitation correctly at this time. We did put patient in a Cam Walker for the next 2 weeks. We'll have her get x-rays before we see her in 2 weeks' time. At that time once we have x-rays are normal and she seems to be improving we'll try to put her in an Aircast likely. Patient was given home exercise program and to do more nonweightbearing exercises for right now. Patient is able to him really in the [facility]. Patient will continue to take hydrocodone and is being given to her by her primary care provider. Once again we'll see patient in 2 weeks.

## 2012-08-19 NOTE — Progress Notes (Signed)
Chief complaint: Left ankle pain  History of present illness: Patient is a 47 year old female who injured her ankle on July 31, 2012 coming in with persistent pain. Patient has been in a ASO lace up brace for the last 2 weeks and has not noticed any significant improvement.  Patient initially injured it when she was going to the mailbox and heard a pop and had significant swelling on the lateral aspect of her ankle. Patient has had x-rays previously which were reviewed today. These were read as unremarkable but there is a potential for a very small avulsion fracture off of it distal inferior portion of the fibula. The patient still describes the pain as a dull aching sensation it has a sharp pain with palpation or any time she hits the lateral aspect of her ankle. Patient denies any numbness and states that the swelling seems to be improving but does get worse at the end of a long day. Patient continues to somewhat ambulate but does walk with a significant limp. Patient states that the pain can wake her up at night. Patient is taking hydrocodone and was given to her by her primary care provider and we have to wait Tylenol secondary to her autoimmune hepatitis, and cirrhosis of the liver.  Past Medical History  Diagnosis Date  . Cystic acne   . 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 // AFP 15 (03/2009)  // Was changed from azathioprine bc unable to achieve remission --> now chronically on Cellcept and prednisone since May 2011// Followed by Dr . Christella Hartigan ([facility]), and now with Dr. Larey Dresser ([facility])  . 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 Perimenopausal   . Macrocytosis without anemia     BL MCV 102-111. Unclear cause. Possibly due to hypothyroidism vs liver disease (although no clear  history of alcohol abuse)  // RBC folate and B12 are within normal limits  . Tobacco abuse   . HTN (hypertension)   . GERD (gastroesophageal reflux disease)     EGD (10/2010) - showing mild gastritis, bx negative - by Dr. Christella Hartigan  . Headache     tension with features of migraine  . History of rectal bleeding     2/2 hemorrhoids, colonoscopy 2008 --> next due 2018  . Hemorrhoids     Small internal and external hemorrhoids noted on colonoscopy (05/2006) - thought to be the cause of the rectal bleeding that she noted.   . Liver cirrhosis secondary to NASH (nonalcoholic steatohepatitis)   . Migraines    Past Surgical History  Procedure Laterality Date  . Cholecystectomy  1992    laparoscopically   . Ercp    . Colonoscopy    . Tubal ligation      fibroid tumor removed from left fallopian tube  . Carpal tunnel release     Family History  Problem Relation Age of Onset  . Diabetes Mother   . Migraines Mother   . Cancer Brother   . Colon cancer Neg Hx   . Colon polyps Neg Hx   . Rectal cancer Neg Hx   . Stomach cancer Neg Hx    History  Substance Use Topics  . Smoking status: Current Every Day Smoker -- 0.50 packs/day for  3 years    Types: Cigarettes  . Smokeless tobacco: Never Used     Comment: 8-9 cigs/day.  Cutting back  . Alcohol Use: No    Physical exam Blood pressure 174/123, height 5\' 1"  (1.549 m), weight 203 lb (92.08 kg). General: No apparent distress alert and oriented x3 mood and affect normal Respiratory: Patient's speak in full sentences and does not appear short of breath Skin: Warm dry intact with no signs of infection or rash Neuro: Cranial nerves II through XII are intact, neurovascularly intact in all extremities with 2+ DTRs and 2+ pulses. Left ankle exam: Patient is a trace effusion on the lateral aspect of the ankle. She is very tender to palpation over the inferior aspect of the fibula. Patient does have a mild anterior drawer test. Patient has no pain  over the medial malleolus or the base of the fifth metatarsal. She is neurovascularly intact distally.

## 2012-08-19 NOTE — Assessment & Plan Note (Signed)
Patient's blood pressure was elevated today but denies any visual changes, any headaches dizziness lightheadedness or abdominal pain. I did discuss with patient I would like her to followup with her primary care provider and discuss other medications that could be helpful. Patient will keep a log of her blood pressure at home and then followup with her primary care provider in the next week.

## 2012-08-23 ENCOUNTER — Other Ambulatory Visit: Payer: Self-pay | Admitting: *Deleted

## 2012-08-25 NOTE — Progress Notes (Signed)
agree with notes and plans. 

## 2012-08-29 ENCOUNTER — Other Ambulatory Visit: Payer: Self-pay | Admitting: *Deleted

## 2012-08-29 MED ORDER — CLONIDINE HCL 0.2 MG PO TABS: 0.2000 mg | ORAL_TABLET | Freq: Every day | ORAL | Status: DC

## 2012-09-02 ENCOUNTER — Encounter: Payer: Self-pay | Admitting: Family Medicine

## 2012-09-02 ENCOUNTER — Ambulatory Visit (INDEPENDENT_AMBULATORY_CARE_PROVIDER_SITE_OTHER): Payer: Medicaid Other | Admitting: Family Medicine

## 2012-09-02 VITALS — BP 179/127 | HR 97 | Ht 61.0 in | Wt 203.0 lb

## 2012-09-02 DIAGNOSIS — I10 Essential (primary) hypertension: Secondary | ICD-10-CM

## 2012-09-02 DIAGNOSIS — S82832D Other fracture of upper and lower end of left fibula, subsequent encounter for closed fracture with routine healing: Secondary | ICD-10-CM

## 2012-09-02 DIAGNOSIS — S8290XD Unspecified fracture of unspecified lower leg, subsequent encounter for closed fracture with routine healing: Secondary | ICD-10-CM

## 2012-09-02 DIAGNOSIS — M25572 Pain in left ankle and joints of left foot: Secondary | ICD-10-CM

## 2012-09-02 DIAGNOSIS — M25579 Pain in unspecified ankle and joints of unspecified foot: Secondary | ICD-10-CM

## 2012-09-02 DIAGNOSIS — S82839A Other fracture of upper and lower end of unspecified fibula, initial encounter for closed fracture: Secondary | ICD-10-CM | POA: Insufficient documentation

## 2012-09-02 NOTE — Progress Notes (Signed)
  Subjective:    Patient ID: Alexandria White, female    DOB: 1965/11/16, 47 y.o.   MRN: [id number]  HPI Date of injury July 31, 2012 Followup left ankle pain. Was found to have an avulsion fracture of the left fibula. We placed her in a Personal assistant. She is about 20% better. She still having a lot of swelling and sharp pain. She has been wearing it as directed.  Review of Systems Noted no skin breakdown, no numbness in the foot. Is having some continued swelling but no erythema.    Objective:   Physical Exam  VS reviewed GENERAL: Well-developed female no acute distress ANKLE: Left. Soft tissue swelling over the lateral malleolus. Tender to palpation here. There's no bruising or ecchymoses. Full range of motion of the ankle. Pain with active inversion. Distally neurovascularly intact.       Assessment & Plan:  Distal fibular avulsion fracture improving. We'll transition to Aircast. Return to clinic 2-3 weeks. At that point we'll need to start rehabilitation program has a sebaceous have some fairly significant weakness

## 2012-09-02 NOTE — Patient Instructions (Addendum)
You are improving. At this time I think we can transition you. to an Aircast rather than the cam walker boot. I would also add 10 minutes of moderately warm to hot soak prior to 10 minutes of ice bath. I think this will help get rid of the swelling sooner. Like to see you back in 2-3 weeks at which time we'll probably start you on home rehabilitation program. Please call here if you have problems.  PLEASE see your regular doctor about your blood pressure--it was 179/127 today and similarly elevated at last visit.

## 2012-09-02 NOTE — Assessment & Plan Note (Signed)
Discussion with her regarding elevated blood pressure on last office visit and today. She is in an extremely elevated pressure state. I discussed this at some length with her. She promised several with her PCP. She is currently on 2 blood pressure medicines.

## 2012-09-08 ENCOUNTER — Ambulatory Visit (INDEPENDENT_AMBULATORY_CARE_PROVIDER_SITE_OTHER): Payer: Medicaid Other | Admitting: Internal Medicine

## 2012-09-08 ENCOUNTER — Encounter: Payer: Self-pay | Admitting: Internal Medicine

## 2012-09-08 VITALS — BP 162/105 | HR 103 | Temp 98.9°F | Ht 62.5 in | Wt 202.9 lb

## 2012-09-08 DIAGNOSIS — F17200 Nicotine dependence, unspecified, uncomplicated: Secondary | ICD-10-CM

## 2012-09-08 DIAGNOSIS — H538 Other visual disturbances: Secondary | ICD-10-CM

## 2012-09-08 DIAGNOSIS — Z720 Tobacco use: Secondary | ICD-10-CM

## 2012-09-08 DIAGNOSIS — E669 Obesity, unspecified: Secondary | ICD-10-CM

## 2012-09-08 DIAGNOSIS — I10 Essential (primary) hypertension: Secondary | ICD-10-CM

## 2012-09-08 MED ORDER — TRIAMTERENE-HCTZ 37.5-25 MG PO TABS: 1.0000 | ORAL_TABLET | Freq: Every day | ORAL | Status: DC

## 2012-09-08 NOTE — Progress Notes (Signed)
Patient: Alexandria White   MRN: [id number]  DOB: 10/30/1965  PCP: Saralyn Pilar, DO   Subjective:    CC: Follow-up   HPI: Ms. Alexandria White is a 47 y.o. female with a PMHx as outlined below, who presented to clinic today for the following:  1) HTN - Patient does check blood pressure regularly at home - typically in 160/100s mmHg. Currently taking Lisinopril 40mg  daily and Clonidine 0.2mg  daily. Patient misses doses 0 x per week on average since last visit - but long history of noncompliance and intermittent usage with prolonged hx of uncontrolled HTN as a result. During visit in 03/2012, was noted to have K of 2.9 and was stopped of her HCTZ at that time, with repeat BMET showing normalization of K since that time. I discussed  denies headaches, dizziness, lightheadedness, chest pain, shortness of breath.  Does indicate some increased blurriness of vision over last several months without vision loss, double vision, severe headaches. does request refills today.  I discussed with the pt her prolonged hx of med noncompliance, and specifically asked her if she expects she will be able to start taking medications regularly. She admits that although she will attempt to do so, she is not sure if her efforts will be consistent.    Review of Systems: Per HPI.   Current Outpatient Medications: Medication Sig  . cholecalciferol (VITAMIN D) 1000 UNITS tablet Take 1,000 Units by mouth daily.  . cloNIDine (CATAPRES) 0.2 MG tablet Take 1 tablet (0.2 mg total) by mouth daily. Take 0.2 mg by mouth daily.  Marland Kitchen lisinopril (PRINIVIL,ZESTRIL) 40 MG tablet TAKE 1 TABLET BY MOUTH EVERY DAY  . loratadine (CLARITIN) 10 MG tablet Take 10 mg by mouth daily.  . mycophenolate (CELLCEPT) 250 MG capsule Take 4 capsules by mouth 2 (two) times daily. 4 cap by mouth 2 times a day  . omeprazole (PRILOSEC) 40 MG capsule Take 1 capsule (40 mg total) by mouth 2 (two) times daily before a meal.  . predniSONE  (DELTASONE) 5 MG tablet Take 20 mg by mouth daily. Take 4 tablets (20 mg total) by mouth daily.    Allergies  Allergen Reactions  . Aspirin     Stomach pain  . Latex Rash    Past Medical History  Diagnosis Date  . Cystic acne   . 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 // AFP 15 (03/2009)  // Was changed from azathioprine bc unable to achieve remission --> now chronically on Cellcept and prednisone since May 2011// Followed by Dr . Christella Hartigan ([facility]), and now with Dr. Larey Dresser ([facility])  . 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 Perimenopausal   . Macrocytosis without anemia     BL MCV 102-111. Unclear cause. Possibly due to hypothyroidism vs liver disease (although no clear history of alcohol abuse)  // RBC folate and B12 are within normal limits  . Tobacco abuse   . HTN (hypertension)   . GERD (gastroesophageal reflux disease)     EGD (10/2010) - showing mild gastritis, bx negative - by Dr. Christella Hartigan  . Headache     tension with features of migraine  . History of rectal bleeding     2/2 hemorrhoids, colonoscopy 2008 -->  next due 2018  . Hemorrhoids     Small internal and external hemorrhoids noted on colonoscopy (05/2006) - thought to be the cause of the rectal bleeding that she noted.   . Liver cirrhosis secondary to NASH (nonalcoholic steatohepatitis)   . Migraines     Objective:    Physical Exam: Filed Vitals:   09/08/12 1507  BP: 162/105  Pulse: 103  Temp: 98.9 F (37.2 C)     General Exam:   General: Vital signs reviewed and noted. Well-developed, well-nourished, in no acute distress; alert, appropriate and cooperative throughout examination.  Head: Normocephalic, atraumatic.  Eyes: No signs of anemia or jaundince.  Nose: Mucous membranes moist, not  inflammed, nonerythematous.  Throat: Oropharynx nonerythematous, no exudate appreciated.   Neck: No deformities, masses, or tenderness noted. Supple, No carotid Bruits, no JVD.  Lungs:  Normal respiratory effort. Clear to auscultation BL without crackles or wheezes.  Heart: RRR. S1 and S2 normal without gallop, or rubs. (+) systolic murmur.  Abdomen:  BS normoactive. Soft, Nondistended, non-tender.  No masses or organomegaly.  Extremities: No pretibial edema.  Skin: No visible rashes, scars.    Neurologic Exam:   Mental Status: Alert, oriented, thought content appropriate.  Speech fluent without evidence of aphasia. Able to follow 3 step commands without difficulty.  Cranial Nerves:   II: Discs flat bilaterally. Visual fields grossly intact.  III/IV/VI: Extraocular movements intact.  Pupils reactive bilaterally.  V/VII: Smile symmetric. facial light touch sensation normal bilaterally.  VIII: Grossly intact.  IX/X: Normal gag.  XI: Bilateral shoulder shrug normal.  XII: Midline tongue extension normal.  Motor:  5/5 bilaterally with normal tone and bulk  Sensory:  Light touch intact throughout, bilaterally  Cerebellar: Normal rapid alternating movements and normal heel-to-shin test.      Assessment/ Plan:   The patient's case and plan of care was discussed with attending physician, Dr. Doneen Poisson.

## 2012-09-08 NOTE — Assessment & Plan Note (Signed)
Pertinent Data: BP Readings from Last 3 Encounters:  09/08/12 162/105  09/02/12 179/127  08/19/12 174/123    Basic Metabolic Panel:    Component Value Date/Time   NA 137 08/18/2012 1440   K 3.8 08/18/2012 1440   CL 102 08/18/2012 1440   CO2 27 08/18/2012 1440   BUN 11 08/18/2012 1440   CREATININE 0.91 08/18/2012 1440   CREATININE 0.7 08/25/2011 1057   GLUCOSE 86 08/18/2012 1440   GLUCOSE 96 05/10/2006 0936   CALCIUM 9.5 08/18/2012 1440    Assessment: Disease Control: moderately elevated  Progress toward goals: unchanged  Barriers to meeting goals: nonadherence to medications    Patient has very intermittent compliance with her medications and can be out for several weeks at a time without informing me. I have spoken with her on multiple occasions regarding risks of medication noncompliance.   Since our last visit together in 07/2012, she has started taking the Clonidine and Lisinopril regularly without missing doses. However, upon inquiring, pt admits to difficulty with regular med compliance.   Therefore, I do not think Clonidine is a good medication for her, and may actually be detrimental.  Of note, she had the prior hx of hypokalemia when on Lisinopril-HCTZ (which corrected after dc of HCTZ in 03/2012).     Plan:  Taper Clonidine to off - pt instructed and verbally instructed back to me the following: take Clonidine 1/2 tablet once daily x 1 week, then take 1/2 tablet every other day x 1 week, then STOP  Start Maxide (to provide K sparing diuretic in addition to the HCTZ)  Continue Lisinopril  Recheck BMET next visit to assess stability of renal function and K  Educational resources provided: handout

## 2012-09-08 NOTE — Assessment & Plan Note (Signed)
  Assessment: 1. The patient was counseled on the dangers of tobacco use, which include, but are not limited to cardiovascular disease, increased cancer risk of multiple types of cancer, COPD, peripheral vascular disease, strokes. 2. She was also counseled on the benefits of smoking cessation. 3. Progress toward smoking cessation:  smoking the same amount 4. Barriers to progress toward smoking cessation:  stress  Plan: 1. Instruction/counseling given: The patient was firmly advised to quit and referred to a tobacco cessation program.   2. We also reviewed strategies to maximize success, including:  Removing cigarettes and smoking materials from environment  Stress management  Substitution of other forms of reinforcement Support of family/friends.  Selecting a quit date. 3. Educational resources provided: smoking cessation handout (tips, strategies, fact sheets) 4. Medications to assist with smoking cessation: None 5. Patient agreed to the following self-care plans for smoking cessation:   None, she is not interested in smoking cessation at this time, although extensive counseling was provided.

## 2012-09-08 NOTE — Patient Instructions (Addendum)
General Instructions:  Please follow-up at the clinic in 3  weeks, at which time we will reevaluate your blood pressure - OR, please follow-up in the clinic sooner if needed.  FOLLOWUP with the dietician, DONNA PLYLER.  There have been changes in your medications:  CHANGE your clonidine - start taking 1/2 tablet once daily for one week --> then change to 1/2 tablet every other day daily for one week, then STOP the clonidine.  START Triamterene-Hydrochlorothiazide for your blood pressure (AND keep taking the Lisinopril) See the information below about this medication.     See the eye doctor as soon as you can.  If you have been started on new medication(s), and you develop symptoms concerning for allergic reaction, including, but not limited to, throat closing, tongue swelling, rash, please stop the medication immediately and call the clinic at [phone], and go to the ER.  If symptoms worsen, or new symptoms arise, please call the clinic or go to the ER.  PLEASE BRING ALL OF YOUR MEDICATIONS  IN A BAG TO YOUR NEXT APPOINTMENT   Treatment Goals:  Goals (1 Years of Data) as of 09/08/12   None      Progress Toward Treatment Goals:  Treatment Goal 09/08/2012  Blood pressure unchanged  Stop smoking smoking the same amount    Self Care Goals & Plans:  Self Care Goal 09/08/2012  Manage my medications take my medicines as prescribed; bring my medications to every visit; refill my medications on time  Monitor my health keep track of my blood pressure; bring my blood pressure log to each visit  Eat healthy foods eat more vegetables; eat baked foods instead of fried foods  Be physically active take a walk every day  Stop smoking (No Data)       Care Management & Community Referrals:  Referral 09/08/2012  Referrals made for care management support nutritionist      LIFESTYLE TIPS TO HELP WITH YOUR BLOOD PRESSURE CONTROL  WEIGHT REDUCTION:  Strategies: A healthy weight  loss program includes:  A calorie restricted diet based on individual calorie needs.   Increased physical activity (exercise).  An exercise program is just as important as the right low-calorie diet.    An unhealthy weight loss program includes:  Fasting.   Fad diets.   Supplements and drugs.  These choices do not succeed in long-term weight control.   Home Care Instructions: To help you make the needed dietary changes:   Exercise and perform physical activity as directed by your caregiver.   Keep a daily record of everything you eat. There are many free websites to help you with this. It may be helpful to measure your foods so you can determine if you are eating the correct portion sizes.   Use low-calorie cookbooks or take special cooking classes.   Avoid alcohol. Drink more water and drinks with no calories.   Take vitamins and supplements only as recommended by your caregiver.   Weight loss support groups, Registered Dieticians, counselors, and stress reduction education can also be very helpful.   ________________________________________________________________________  DASH DIET:  The DASH diet stands for "Dietary Approaches to Stop Hypertension." It is a healthy eating plan that has been shown to reduce high blood pressure (hypertension) in as little as 14 days, while also possibly providing other significant health benefits. These other health benefits include reducing the risk of breast cancer after menopause and reducing the risk of type 2 diabetes, heart disease, colon cancer, and  stroke. Health benefits also include weight loss and slowing kidney failure in patients with chronic kidney disease.   Diet guidelines: Limit salt (sodium). Your diet should contain less than 1500 mg of sodium daily.  Limit refined or processed carbohydrates. Your diet should include mostly whole grains. Desserts and added sugars should be used sparingly.  Include small amounts of  heart-healthy fats. These types of fats include nuts, oils, and tub margarine. Limit saturated and trans fats. These fats have been shown to be harmful in the body.   Choosing Foods: The following food groups are based on a 2000 calorie diet. See your Registered Dietitian for individual calorie needs.  Grains and Grain Products (6 to 8 servings daily)  Eat More Often: Whole-wheat bread, brown rice, whole-grain or wheat pasta, quinoa, popcorn without added fat or salt (air popped).  Eat Less Often: White bread, white pasta, white rice, cornbread.  Vegetables (4 to 5 servings daily)  Eat More Often: Fresh, frozen, and canned vegetables. Vegetables may be raw, steamed, roasted, or grilled with a minimal amount of fat.  Eat Less Often/Avoid: Creamed or fried vegetables. Vegetables in a cheese sauce.  Fruit (4 to 5 servings daily)  Eat More Often: All fresh, canned (in natural juice), or frozen fruits. Dried fruits without added sugar. One hundred percent fruit juice ( cup [237 mL] daily).  Eat Less Often: Dried fruits with added sugar. Canned fruit in light or heavy syrup.  [employer], Fish, and Poultry (2 servings or less daily. One serving is 3 to 4 oz [85-114 g]).  Eat More Often: Ninety percent or leaner ground beef, tenderloin, sirloin. Round cuts of beef, chicken breast, [place] breast. All fish. Grill, bake, or broil your meat. Nothing should be fried.  Eat Less Often/Avoid: Fatty cuts of meat, [place], or chicken leg, thigh, or wing. Fried cuts of meat or fish.  Dairy (2 to 3 servings)  Eat More Often: Low-fat or fat-free milk, low-fat plain or light yogurt, reduced-fat or part-skim cheese.  Eat Less Often/Avoid: Milk (whole, 2%, skim, or chocolate). Whole milk yogurt. Full-fat cheeses.  Nuts, Seeds, and Legumes (4 to 5 servings per week)  Eat More Often: All without added salt.  Eat Less Often/Avoid: Salted nuts and seeds, canned beans with added salt.  Fats and Sweets (limited)  Eat More  Often: Vegetable oils, tub margarines without trans fats, sugar-free gelatin. Mayonnaise and salad dressings.  Eat Less Often/Avoid: Coconut oils, palm oils, butter, stick margarine, cream, half and half, cookies, candy, pie.   ________________________________________________________________________  Smoking Cessation Tips 1-800-QUIT-NOW  This document explains the best ways for you to quit smoking and new treatments to help. It lists new medicines that can double or triple your chances of quitting and quitting for good. It also considers ways to avoid relapses and concerns you may have about quitting, including weight gain.   Nicotine: A Powerful Addiction If you have tried to quit smoking, you know how hard it can be. It is hard because nicotine is a very addictive drug. For some people, it can be as addictive as heroin or cocaine. Usually, people make 2 or 3 tries, or more, before finally being able to quit. Each time you try to quit, you can learn about what helps and what hurts. Quitting takes hard work and a lot of effort, but you can quit smoking.   Quitting smoking is one of the most important things you will ever do You will live longer, feel better, and  live better.  The impact on your body of quitting smoking is felt almost immediately:   Five keys to quitting: Studies have shown that these 5 steps will help you quit smoking and quit for good. You have the best chances of quitting if you use them together:   1. GET READY  Set a quit date.  Change your environment.  Get rid of ALL cigarettes, ashtrays, matches, and lighters in your home, car, and place of work.  Do not let people smoke in your home.  Review your past attempts to quit. Think about what worked and what did not.  Once you quit, do not smoke. NOT EVEN A PUFF!   2. GET SUPPORT AND ENCOURAGEMENT  Tell your family, friends, and coworkers that you are going to quit and need their support. Ask them not to smoke around  you.  Get individual, group, or telephone counseling and support.  Many smokers find one or more of the many self-help books available useful in helping them quit and stay off tobacco.   3. LEARN NEW SKILLS AND BEHAVIORS  Try to distract yourself from urges to smoke. Talk to someone, go for a walk, or occupy your time with a task.  When you first try to quit, change your routine. Take a different route to work. Drink tea instead of coffee. Eat breakfast in a different place.  Do something to reduce your stress. Take a hot bath, exercise, or read a book.  Plan something enjoyable to do every day. Reward yourself for not smoking.  Explore interactive web-based programs that specialize in helping you quit.   4. GET MEDICINE AND USE IT CORRECTLY .  Medicines can help you stop smoking and decrease the urge to smoke. Combining medicine with the above behavioral methods and support can quadruple your chances of successfully quitting smoking.  Talk with your doctor about these options.  5. BE PREPARED FOR RELAPSE OR DIFFICULT SITUATIONS  Most relapses occur within the first 3 months after quitting. Do not be discouraged if you start smoking again. Remember, most people try several times before they finally quit.  You may have symptoms of withdrawal because your body is used to nicotine. You may crave cigarettes, be irritable, feel very hungry, cough often, get headaches, or have difficulty concentrating.  The withdrawal symptoms are only temporary. They are strongest when you first quit, but they will go away within 10 to 14 days.   Quitting takes hard work and a lot of effort, but you can quit smoking.   FOR MORE INFORMATION  Smokefree.gov (http://www.davis-sullivan.com/) provides free, accurate, evidence-based information and professional assistance to help support the immediate and long-term needs of people trying to quit smoking.  Document Released: 04/07/2001 Document Re-Released: 10/01/2009   [facility] Patient Information 2011 [place], [place].      Hydrochlorothiazide, HCTZ; Triamterene tablets or capsules What is this medicine? HYDROCHLOROTHIAZIDE; TRIAMTERENE (hye droe klor oh THYE a zide; trye AM ter een) is a diuretic. It helps you make more urine and lose the extra water from your body. This medicine is used to treat high blood pressure and edema or swelling from excess water. This medicine may be used for other purposes; ask your health care provider or pharmacist if you have questions. What should I tell my health care provider before I take this medicine? They need to know if you have any of these conditions: -diabetes -immune system problems, like lupus -kidney disease or stones -liver disease -small amount of urine  or difficulty passing urine -an unusual or allergic reaction to triamterene, hydrochlorothiazide, sulfa drugs, other medicines, foods, dyes, or preservatives -pregnant or trying to get pregnant -breast-feeding How should I use this medicine? Take this medicine by mouth with a glass of water. Follow the directions on your prescription label. Take your medicine at regular intervals. Do not take it more often than directed. Do not stop taking except on your doctor's advice. Remember that you will need to pass urine frequently after taking this medicine. Do not take your doses at a time of day that will cause you problems. Do not take at bedtime. Talk to your pediatrician regarding the use of this medicine in children. Special care may be needed. Overdosage: If you think you have taken too much of this medicine contact a poison control center or emergency room at once. NOTE: This medicine is only for you. Do not share this medicine with others. What if I miss a dose? If you miss a dose, take it as soon as you can. If it is almost time for your next dose, take only that dose. Do not take double or extra doses. What may interact with this medicine? Do not take  this medicine with any of the following medications: -eplerenone This medicine may also interact with the following medications: -cyclosporine -heart medicines like ACE inhibitors, digoxin, dofetilide, eplerenone, angiotensin II antagonists, and medicines for blood pressure -lithium -medicines for diabetes -medicines for inflammation like indomethacin -medicines that relax muscles for surgery -other diuretics -potassium -sotalol -tacrolimus This list may not describe all possible interactions. Give your health care provider a list of all the medicines, herbs, non-prescription drugs, or dietary supplements you use. Also tell them if you smoke, drink alcohol, or use illegal drugs. Some items may interact with your medicine. What should I watch for while using this medicine? Visit your doctor or health care professional for regular check ups. You will need lab work done before you start this medicine and regularly while you are taking it. Check your blood pressure regularly. Ask your health care professional what your blood pressure should be, and when you should contact them. If you are a diabetic, check your blood sugar as directed. Do not stop taking your medicine unless your doctor tells you to. You may need to be on a special diet while taking this medicine. Ask your doctor. Also, ask how many glasses of fluid you need to drink a day. You must not get dehydrated. You may get drowsy or dizzy. Do not drive, use machinery, or do anything that needs mental alertness until you know how this medicine affects you. Do not stand or sit up quickly, especially if you are an older patient. This reduces the risk of dizzy or fainting spells. Alcohol may interfere with the effect of this medicine. Avoid or limit alcoholic drinks. This medicine can make you more sensitive to the sun. Keep out of the sun. If you cannot avoid being in the sun, wear protective clothing and use sunscreen. Do not use sun lamps or  tanning beds/booths. What side effects may I notice from receiving this medicine? Side effects that you should report to your doctor or health care professional as soon as possible: -allergic reactions such as skin rash or itching, hives, swelling of the lips, mouth, tongue, or throat -changes in vision -eye pain -fast or irregular heartbeat, chest pain -feeling faint or dizzy -gout attack -muscle pain or cramps -numbness or tingling in hands, feet, or lips -pain  or difficulty when passing urine -redness, blistering, peeling or loosening of the skin, including inside the mouth -shortness of breath -unusually weak or tired Side effects that usually do not require medical attention (report to your doctor or health care professional if they continue or are bothersome): -change in sex drive or performance -dry mouth -headache -stomach upset This list may not describe all possible side effects. Call your doctor for medical advice about side effects. You may report side effects to FDA at 1-800-FDA-1088. Where should I keep my medicine? Keep out of the reach of children. Store at room temperature between 15 and 30 degrees C (59 and 86 degrees F). Protect from light. Throw away any unused medicine after the expiration date. NOTE: This sheet is a summary. It may not cover all possible information. If you have questions about this medicine, talk to your doctor, pharmacist, or health care provider.  2012, Elsevier/Gold Standard. (01/01/2010 2:54:04 PM)

## 2012-09-08 NOTE — Assessment & Plan Note (Signed)
Assessment: BL blurred vision x several months. She denies focal deficits or vision loss. Given hx of uncontrolled HTN, I do want her to have a dilated eye exam. On my limited nondilated exam in clinic today, no gross abnormalities were noted.   Plan:      Ophthamology or optometry referral.

## 2012-09-12 NOTE — Progress Notes (Signed)
Case discussed with Dr. Kalia-Reynolds at the time of the visit.  We reviewed the resident's history and exam and pertinent patient test results.  I agree with the assessment, diagnosis and plan of care documented in the resident's note. 

## 2012-09-16 ENCOUNTER — Ambulatory Visit: Payer: Medicaid Other | Admitting: Family Medicine

## 2012-09-26 ENCOUNTER — Ambulatory Visit: Payer: Medicaid Other | Admitting: Family Medicine

## 2012-10-03 ENCOUNTER — Ambulatory Visit: Payer: Medicaid Other | Admitting: Family Medicine

## 2012-10-04 ENCOUNTER — Ambulatory Visit: Payer: Medicaid Other | Admitting: Dietician

## 2012-10-10 ENCOUNTER — Ambulatory Visit: Payer: Medicaid Other | Admitting: Dietician

## 2012-11-07 NOTE — Addendum Note (Signed)
Addended by: Neomia Dear on: 11/07/2012 02:51 PM   Modules accepted: Orders

## 2012-11-18 ENCOUNTER — Other Ambulatory Visit: Payer: Self-pay | Admitting: *Deleted

## 2012-11-18 MED ORDER — LISINOPRIL 40 MG PO TABS: 40.0000 mg | ORAL_TABLET | Freq: Every day | ORAL | Status: DC

## 2012-11-25 ENCOUNTER — Encounter: Payer: Self-pay | Admitting: Internal Medicine

## 2012-11-25 ENCOUNTER — Ambulatory Visit (INDEPENDENT_AMBULATORY_CARE_PROVIDER_SITE_OTHER): Payer: Medicaid Other | Admitting: Internal Medicine

## 2012-11-25 VITALS — BP 138/82 | HR 116 | Temp 97.0°F | Ht 62.0 in | Wt 198.9 lb

## 2012-11-25 DIAGNOSIS — F17200 Nicotine dependence, unspecified, uncomplicated: Secondary | ICD-10-CM

## 2012-11-25 DIAGNOSIS — S61209A Unspecified open wound of unspecified finger without damage to nail, initial encounter: Secondary | ICD-10-CM

## 2012-11-25 DIAGNOSIS — J209 Acute bronchitis, unspecified: Secondary | ICD-10-CM

## 2012-11-25 DIAGNOSIS — S61012A Laceration without foreign body of left thumb without damage to nail, initial encounter: Secondary | ICD-10-CM

## 2012-11-25 DIAGNOSIS — Z720 Tobacco use: Secondary | ICD-10-CM

## 2012-11-25 DIAGNOSIS — I10 Essential (primary) hypertension: Secondary | ICD-10-CM

## 2012-11-25 DIAGNOSIS — K754 Autoimmune hepatitis: Secondary | ICD-10-CM

## 2012-11-25 DIAGNOSIS — IMO0002 Reserved for concepts with insufficient information to code with codable children: Secondary | ICD-10-CM

## 2012-11-25 LAB — BASIC METABOLIC PANEL WITH GFR
BUN: 11 mg/dL (ref 6–23)
CO2: 27 mEq/L (ref 19–32)
Creat: 0.97 mg/dL (ref 0.50–1.10)
GFR, Est African American: 80 mL/min
GFR, Est Non African American: 70 mL/min

## 2012-11-25 MED ORDER — LORATADINE 10 MG PO TABS: 10.0000 mg | ORAL_TABLET | Freq: Every day | ORAL | Status: DC

## 2012-11-25 MED ORDER — TRIAMTERENE-HCTZ 37.5-25 MG PO TABS: 1.0000 | ORAL_TABLET | Freq: Every day | ORAL | Status: DC

## 2012-11-25 MED ORDER — BENZONATATE 200 MG PO CAPS: 200.0000 mg | ORAL_CAPSULE | Freq: Three times a day (TID) | ORAL | Status: DC | PRN

## 2012-11-25 MED ORDER — LISINOPRIL 40 MG PO TABS: 40.0000 mg | ORAL_TABLET | Freq: Every day | ORAL | Status: DC

## 2012-11-25 NOTE — Assessment & Plan Note (Addendum)
Clinical history and physical exam points towards this being an acute bronchitis as opposed to a pneumonia. She does not have respiratory distress and oxygen saturation is normal. Chest sounds clear bilaterally. The most likely etiology of this bronchitis is viral.  I do not suspect H1N1 at this time.No indication for antibiotic therapy. I have counseled the patient to increase her oral fluid intake and also prescribed 20 pills of Tessalon for symptomatic relief. I have counseled the patient that if the symptoms do not resolve over the next few days or in case of increased shortness of breath, chest pain, or hemoptysis to call the clinic for further evaluation.

## 2012-11-25 NOTE — Assessment & Plan Note (Addendum)
In the setting of acute illness with acute bronchitis, I have increased her prednisone from 40 mg daily to 80 mg daily for the next 3 days and then to continue with her regular dose (40 mg daily) after those 3 days. I have clearly communicated this instruction to the patient without changing her prescription as reflected in Epic. She verbalized understanding. Patient is currently taking vitamins D. It is unclear whether she has had a DEXA scan. She will also require calcium supplements. This will be addressed by her PCP.

## 2012-11-25 NOTE — Progress Notes (Signed)
Patient ID: Alexandria White White, female   DOB: 11-09-1965, 47 y.o.   MRN: [id number]  HPI: Alexandria White White is a 47 y.o. with past medical history including hypertension, autoimmune hepatitis on chronic steroid therapy, macrocytic anemia, smoking, and allergic rhinitis, presents to the clinic with 5 days history of cough and the pain of her left thumb.  Cough. She complains of cough with yellow sputum production, without hemoptysis. She denies increased shortness of breath, but she has central chest pain associated with coughing. She checked her temperature on the day prior to presentation to the clinic and was 101.3. She also reports of sore throat, headaches, chills, general muscle aches, joint pains, and the general body weakness. She denies history of sick contacts. No nausea or vomiting. She denies ear pain, or area of drainage. She does not have wheezing, or rhinorrhea. She has not used any remedies.  Left thumb pain Patient reports history of trauma to the left thumb when she was moving her fridge 5 days ago. This was followed by some bleeding and an open wound on the palmar aspect of the distal thumb. She has been trying to keep it clean and she feels is getting better, but still painful to a scale of about 6-7/10. She has not noticed any increased drainage or extension of the swelling proximally. She denies limitation in the movement of the thumb.  She reports, that she's been out of her lisinopril for one week and she also requests for refills on Claritin and Maxzide. Otherwise, she reports improved compliance with her medications.   Please see the A&P for the status of the pt's chronic medical problems.    Past Medical History  Diagnosis Date  . Cystic acne   . Autoimmune hepatitis DX: Sept 2010    Initial labs (12/2008) - ANA Alexandria White White neg, F actin / IgG Ab high at 53 // Cirrhosis on index liver bx (03/2009) and at least bridging fibrosis on 2nd biopsy in 01/2010. // AFP 15 (03/2009)   // Was changed from azathioprine bc unable to achieve remission --> now chronically on Cellcept and prednisone since May 2011// Followed by Dr . Christella Hartigan ([facility]), and now with Dr. Larey Dresser ([facility])  . 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 Perimenopausal   . Macrocytosis without anemia     BL MCV 102-111. Unclear cause. Possibly due to hypothyroidism vs liver disease (although no clear history of alcohol abuse)  // RBC folate and B12 are within normal limits  . Tobacco abuse   . HTN (hypertension)   . GERD (gastroesophageal reflux disease)     EGD (10/2010) - showing mild gastritis, bx negative - by Dr. Christella Hartigan  . Headache(784.0)     tension with features of migraine  . History of rectal bleeding     2/2 hemorrhoids, colonoscopy 2008 --> next due 2018  . Hemorrhoids     Small internal and external hemorrhoids noted on colonoscopy (05/2006) - thought to be the cause of the rectal bleeding that she noted.   . Liver cirrhosis secondary to NASH (nonalcoholic steatohepatitis)   . Migraines    Current Outpatient Prescriptions  Medication Sig Dispense Refill  . benzonatate (TESSALON) 200 MG capsule Take 1 capsule (200 mg total) by mouth 3 (three) times daily as needed for cough.  20 capsule  0  . cholecalciferol (VITAMIN D) 1000 UNITS tablet Take 1,000 Units by mouth daily.      Marland Kitchen  lisinopril (PRINIVIL,ZESTRIL) 40 MG tablet Take 1 tablet (40 mg total) by mouth daily.  30 tablet  3  . loratadine (CLARITIN) 10 MG tablet Take 1 tablet (10 mg total) by mouth daily.  60 tablet  3  . mycophenolate (CELLCEPT) 250 MG capsule Take 4 capsules by mouth 2 (two) times daily. 4 cap by mouth 2 times a day      . omeprazole (PRILOSEC) 40 MG capsule Take 1 capsule (40 mg total) by mouth 2 (two) times daily before a meal.  60 capsule  11  . predniSONE (DELTASONE) 5 MG tablet Take 20 mg by mouth daily. Take 4 tablets (20 mg total) by mouth daily.      Marland Kitchen  topiramate (TOPAMAX) 100 MG tablet Take by mouth. Take 100 mg by mouth daily as needed.      . triamterene-hydrochlorothiazide (MAXZIDE-25) 37.5-25 MG per tablet Take 1 tablet by mouth daily.  30 tablet  2   No current facility-administered medications for this visit.   Family History  Problem Relation Age of Onset  . Diabetes Mother   . Migraines Mother   . Cancer Brother   . Colon cancer Neg Hx   . Colon polyps Neg Hx   . Rectal cancer Neg Hx   . Stomach cancer Neg Hx    History   Social History  . Marital Status: Single    Spouse Name: [name]    Number of Children: 6  . Years of Education: [name]   Social History Main Topics  . Smoking status: Current Every Day Smoker -- 0.50 packs/day for 3 years    Types: Cigarettes  . Smokeless tobacco: Never Used     Comment: 8-9 cigs/day.  Cutting back.  6-7 cigerettes per day  . Alcohol Use: No  . Drug Use: No  . Sexually Active: None   Other Topics Concern  . None   Social History Narrative   Last updated: 11/28/2009    Applied for disability but was denied, currently appealing, and currently unemployed   Previously worked at [employer]   Does hair on the side    Lives alone, in a long-term relationship    6 children - living with one child currently, 74 years old, rest are grown   Smokes 4 cig daily, has cut back from 1 PPD   No alcohol or illicit drug use     Review of Systems: Cardiovascular: No chest pain, palpitations and leg swelling. However, she reports increased episodes of sweating, and sometimes feeling of palpitations. This has been a chronic problem for over the last 4 years, but it has somehow increased over the past 3 months. She associates this with her perimenopausal symptoms. Gastrointestinal: No abdominal pain, nausea, vomiting, bloody stools Genitourinary: No dysuria, frequency, hematuria, or flank pain.   Objective:  Physical Exam: Filed Vitals:   11/25/12 1103  BP: 138/82  Pulse: 116  Temp: 97 F (36.1  C)  Height: 5\' 2"  (1.575 m)  Weight: 198 lb 14.4 oz (90.22 kg)  SpO2: 96%   General: Appears miserable. Well nourished. Not in acute distress.  HEENT: Throat without erythema, or or exudates. Ear exam bilaterally is normal. No cervical lymphadenopathy. Lungs: CTA bilaterally. No wheezing. Heart: RRR; no extra sounds or murmurs  Abdomen: Non-distended, normal BS, soft, nontender; no hepatosplenomegaly  Extremities: No pedal edema. No joint swelling.  Left thumb: Slightly swollen with tenderness and discoloration of the nail, extending to the nail bed, with a blue hue. There  is a small open wound measuring about 1cm transversely over the distal phalanx of the thumb of the palmar aspect. The wound base looks clean without evidence of infection, like, drainage or fluctuance. Good capillary refill.  Neurologic: Alert and oriented x3. No obvious neurologic deficits.  Assessment & Plan:  I have discussed my assessment and plan  with Dr. Rogelia Boga as detailed under problem based charting.

## 2012-11-25 NOTE — Patient Instructions (Addendum)
Please take double the dose of prednisone for the next three days Please take Tessalon for your chest congestion If you develop severe shortness of breath of chest pain, -please call clinic Please keep your thumb clean  Please take your medications as prescribed    Acute Bronchitis Bronchitis is when the organs and tissues involved in breathing get puffy (swollen) and can leak fluid. This makes it harder for air to get in and out of the lungs. You may cough a lot and produce thick spit (mucus). Acute means the illness started suddenly. HOME CARE  Rest.  Drink enough fluids to keep the pee (urine) clear or pale yellow.  Medicines may be given that will open up your airways to help you breathe better. Only take medicine as told by your doctor.  Use a cool mist vaporizer. This will help to thin any thick spit.  Do not smoke. Avoid secondhand smoke. GET HELP RIGHT AWAY IF:   You have a temperature by mouth above 102 F (38.9 C), not controlled by medicine.  You have chills.  You develop severe shortness of breath or chest pain.  You have bloody spit mixed with mucus (sputum).  You throw up (vomit) often.  You lose too much body fluid (dehydrated).  You have a severe headache.  You feel faint.  You do not improve after 1 week of treatment. MAKE SURE YOU:   Understand these instructions.  Will watch your condition.  Will get help right away if you are not doing well or get worse. Document Released: 09/30/2007 Document Revised: 07/06/2011 Document Reviewed: 05/01/2009 [facility] Patient Information 2014 [place], [place].     Treatment Goals:  Goals (1 Years of Data) as of 11/25/12         As of Today 09/08/12 09/02/12 08/19/12 08/18/12     Blood Pressure    . Blood Pressure < 140/90  138/82 162/105 179/127 174/123 168/121     Lifestyle    . Quit smoking / using tobacco            Progress Toward Treatment Goals:  Treatment Goal 11/25/2012  Blood pressure at goal   Stop smoking smoking the same amount    Self Care Goals & Plans:  Self Care Goal 11/25/2012  Manage my medications take my medicines as prescribed; bring my medications to every visit; refill my medications on time  Monitor my health -  Eat healthy foods drink diet soda or water instead of juice or soda; eat more vegetables; eat foods that are low in salt; eat baked foods instead of fried foods  Be physically active -  Stop smoking -       Care Management & Community Referrals:  Referral 09/08/2012  Referrals made for care management support nutritionist

## 2012-11-28 NOTE — Progress Notes (Signed)
Case discussed with Dr.Kazibwe at the time of the visit.  We reviewed the resident's history and exam and pertinent patient test results.  I agree with the assessment, diagnosis, and plan of care documented in the resident's note.    

## 2012-12-29 ENCOUNTER — Other Ambulatory Visit: Payer: Self-pay | Admitting: *Deleted

## 2012-12-30 MED ORDER — VITAMIN D 1000 UNITS PO TABS: 1000.0000 [IU] | ORAL_TABLET | Freq: Every day | ORAL | Status: DC

## 2013-02-28 ENCOUNTER — Ambulatory Visit (INDEPENDENT_AMBULATORY_CARE_PROVIDER_SITE_OTHER): Payer: Medicaid Other | Admitting: Internal Medicine

## 2013-02-28 ENCOUNTER — Encounter: Payer: Self-pay | Admitting: Internal Medicine

## 2013-02-28 VITALS — BP 129/85 | HR 82 | Temp 97.6°F | Ht 63.0 in | Wt 201.5 lb

## 2013-02-28 DIAGNOSIS — Z0000 Encounter for general adult medical examination without abnormal findings: Secondary | ICD-10-CM

## 2013-02-28 DIAGNOSIS — K754 Autoimmune hepatitis: Secondary | ICD-10-CM

## 2013-02-28 DIAGNOSIS — I10 Essential (primary) hypertension: Secondary | ICD-10-CM

## 2013-02-28 DIAGNOSIS — Z23 Encounter for immunization: Secondary | ICD-10-CM

## 2013-02-28 LAB — CBC WITH DIFFERENTIAL/PLATELET
Eosinophils Absolute: 0.1 10*3/uL (ref 0.0–0.7)
Eosinophils Relative: 2 % (ref 0–5)
HCT: 41.3 % (ref 36.0–46.0)
Hemoglobin: 14 g/dL (ref 12.0–15.0)
Lymphs Abs: 2.5 10*3/uL (ref 0.7–4.0)
MCH: 34.6 pg — ABNORMAL HIGH (ref 26.0–34.0)
MCHC: 33.9 g/dL (ref 30.0–36.0)
MCV: 102 fL — ABNORMAL HIGH (ref 78.0–100.0)
Monocytes Absolute: 0.8 10*3/uL (ref 0.1–1.0)
Monocytes Relative: 10 % (ref 3–12)
Neutrophils Relative %: 59 % (ref 43–77)
RBC: 4.05 MIL/uL (ref 3.87–5.11)

## 2013-02-28 LAB — COMPLETE METABOLIC PANEL WITH GFR
ALT: 143 U/L — ABNORMAL HIGH (ref 0–35)
AST: 161 U/L — ABNORMAL HIGH (ref 0–37)
Alkaline Phosphatase: 150 U/L — ABNORMAL HIGH (ref 39–117)
Creat: 0.87 mg/dL (ref 0.50–1.10)
Sodium: 140 mEq/L (ref 135–145)
Total Bilirubin: 1.7 mg/dL — ABNORMAL HIGH (ref 0.3–1.2)
Total Protein: 7.4 g/dL (ref 6.0–8.3)

## 2013-02-28 NOTE — Assessment & Plan Note (Signed)
Assessment-  Bp from last 3 encounters. Currently on - Lisinopril- 40mg  dly, Triamterine-HCt- 37.5-25mg  dly.   BP& BMI  02/28/2013 11/25/2012 09/08/2012  BP 129/85 138/82 162/105  Last BMp- 11/25/2012- Na- 136, K- 4.0, Cr- 0.97, GFR- 80.   Plan- Continue current regimen.

## 2013-02-28 NOTE — Assessment & Plan Note (Signed)
Assessment- It appears patient might be having a flare of her autoimmune hepatitis, despite reported compliance with medication regimen.  Patient has appointments to see a hepatologist tomorrow.patient currently on prednisone 20 mg daily mycophenolate 4 caps BID.  Plan-  Patient strongly encouraged to keep up with her appointment tomorrow. - CMP check today. - CBC check today. - Urinalysis, as patient complained of some frequency.

## 2013-02-28 NOTE — Patient Instructions (Addendum)
Please keep your appointment with your liver doctor tomorrow. We suspect it is your liver that is causing you to feel weak. Also take all your medications as previously prescribed. We will not be making any changes to your medications at this time.

## 2013-02-28 NOTE — Progress Notes (Signed)
Patient ID: Alexandria White, female   DOB: [birth year], 47 y.o.   MRN: [id number]   Subjective:   Patient ID: Alexandria White female   DOB: [birth year] 47 y.o.   MRN: [id number]  HPI: Alexandria White is a 47 y.o. past medical history of hypertension, autoimmune hepatitis with liver cirrhosis, gastroesophageal reflux disease. Patient presented today with complaints of weakness and poor appetite. Weakness  Started 5 days ago, with associated poor appetite and nausea but no vomiting.  Patient also complained of abdominal pain present about the past 5 days, described as burning, in Right upper quadrant and epigastric region, non-radiating, no aggravating or relieving factors. It is associated to pruritus, patient also noted that her eyes were yellow for a day, no change in stool, no fever. Patient denies alcohol intake but currently smokes half a pack of cigarette daily and has for the past 31 years. Patient is on mycophenolate and chronic prednisone therapy, claims compliance with medication. Patient follows with a hepatologist in [place]- at [place], information available on care everywhere. Patinet says she has an appoitment with her hepatologist tomorrow and plans to keep the appointment.  Past Medical History  Diagnosis Date  . Cystic acne   . 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 // AFP 15 (03/2009)  // Was changed from azathioprine bc unable to achieve remission --> now chronically on Cellcept and prednisone since May 2011// Followed by Dr . Christella Hartigan ([facility]), and now with Dr. Larey Dresser ([facility])  . 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 Perimenopausal   . Macrocytosis without anemia     BL MCV 102-111. Unclear cause. Possibly due to hypothyroidism vs liver disease (although  no clear history of alcohol abuse)  // RBC folate and B12 are within normal limits  . Tobacco abuse   . HTN (hypertension)   . GERD (gastroesophageal reflux disease)     EGD (10/2010) - showing mild gastritis, bx negative - by Dr. Christella Hartigan  . Headache(784.0)     tension with features of migraine  . History of rectal bleeding     2/2 hemorrhoids, colonoscopy 2008 --> next due 2018  . Hemorrhoids     Small internal and external hemorrhoids noted on colonoscopy (05/2006) - thought to be the cause of the rectal bleeding that she noted.   . Liver cirrhosis secondary to NASH (nonalcoholic steatohepatitis)   . Migraines    Current Outpatient Prescriptions  Medication Sig Dispense Refill  . benzonatate (TESSALON) 200 MG capsule Take 1 capsule (200 mg total) by mouth 3 (three) times daily as needed for cough.  20 capsule  0  . cholecalciferol (VITAMIN D) 1000 UNITS tablet Take 1 tablet (1,000 Units total) by mouth daily.  100 tablet  2  . lisinopril (PRINIVIL,ZESTRIL) 40 MG tablet Take 1 tablet (40 mg total) by mouth daily.  30 tablet  3  . loratadine (CLARITIN) 10 MG tablet Take 1 tablet (10 mg total) by mouth daily.  60 tablet  3  . mycophenolate (CELLCEPT) 250 MG capsule Take 4 capsules by mouth 2 (two) times daily. 4 cap by mouth 2 times a day      . omeprazole (PRILOSEC) 40 MG capsule Take 1 capsule (40 mg total)  by mouth 2 (two) times daily before a meal.  60 capsule  11  . predniSONE (DELTASONE) 5 MG tablet Take 20 mg by mouth daily. Take 4 tablets (20 mg total) by mouth daily.      Marland Kitchen topiramate (TOPAMAX) 100 MG tablet Take by mouth. Take 100 mg by mouth daily as needed.      . triamterene-hydrochlorothiazide (MAXZIDE-25) 37.5-25 MG per tablet Take 1 tablet by mouth daily.  30 tablet  2   No current facility-administered medications for this visit.   Family History  Problem Relation Age of Onset  . Diabetes Mother   . Migraines Mother   . Cancer Brother   . Colon cancer Neg Hx   . Colon  polyps Neg Hx   . Rectal cancer Neg Hx   . Stomach cancer Neg Hx    History   Social History  . Marital Status: Single    Spouse Name: [name]    Number of Children: 6  . Years of Education: [name]   Social History Main Topics  . Smoking status: Current Every Day Smoker -- 0.50 packs/day for 3 years    Types: Cigarettes  . Smokeless tobacco: Never Used     Comment: 8-9 cigs/day.  Cutting back.  6-7 cigerettes per day  . Alcohol Use: No  . Drug Use: No  . Sexual Activity: None   Other Topics Concern  . None   Social History Narrative   Last updated: 11/28/2009    Applied for disability but was denied, currently appealing, and currently unemployed   Previously worked at [employer]   Does hair on the side    Lives alone, in a long-term relationship    6 children - living with one child currently, 79 years old, rest are grown   Smokes 4 cig daily, has cut back from 1 PPD   No alcohol or illicit drug use    Review of Systems: CONSTITUTIONAL- Fever, weightloss, night sweat, appetite. HEAD- No Headache,or dizziness.. RESPIRATORY- No Cough, or SOB. CARDIAC- No Palpitations, or chest pain. URINARY- Complaints of some frequency, but no dysuria.  PYSCH- Normal Mood, no depression, or anxiety symptoms.  Objective:  Physical Exam: Filed Vitals:   02/28/13 1026  BP: 129/85  Pulse: 82  Temp: 97.6 F (36.4 C)  TempSrc: Oral  Height: 5\' 3"  (1.6 m)  Weight: 201 lb 8 oz (91.4 kg)  SpO2: 98%   GENERAL- alert, co-operative, appears as stated age, not in any distress. HEENT- Atraumatic, normocephalic, anicteric, PERRL, EOMI, oral mucosa appears moist. CARDIAC- RRR, no murmurs, rubs or gallops. RESP- Moving equal volumes of air, and clear to auscultation bilaterally. ABDOMEN- Soft,mild tenderness in RUQ and epigastric region, no guarding or rebound, no palpable masses or organomegaly, bowel sounds present. NEURO- No obvious Cr N abnormality, Extremities- Pulse 2+, symmetric, +1 mild  pitting pedal edema, likely due to liver cirrhosis. PSYCH- Normal mood and affect, appropriate thought content and speech.  Assessment & Plan:  The patient's case and plan of care was discussed with attending physician, Dr.   Please see problem based chatting for assessment and plan.

## 2013-02-28 NOTE — Assessment & Plan Note (Signed)
Flu Shot today 

## 2013-03-01 LAB — URINALYSIS, MICROSCOPIC ONLY: Casts: NONE SEEN

## 2013-03-01 LAB — URINALYSIS, ROUTINE W REFLEX MICROSCOPIC
Glucose, UA: NEGATIVE mg/dL
Hgb urine dipstick: NEGATIVE
pH: 6 (ref 5.0–8.0)

## 2013-03-01 NOTE — Progress Notes (Signed)
I saw and evaluated the patient.  I personally confirmed the key portions of the history and exam documented by Dr. Emokpae and I reviewed pertinent patient test results.  The assessment, diagnosis, and plan were formulated together and I agree with the documentation in the resident's note. 

## 2013-03-11 ENCOUNTER — Other Ambulatory Visit: Payer: Self-pay | Admitting: Internal Medicine

## 2013-05-25 ENCOUNTER — Other Ambulatory Visit: Payer: Self-pay | Admitting: Gastroenterology

## 2013-06-25 ENCOUNTER — Other Ambulatory Visit: Payer: Self-pay | Admitting: Internal Medicine

## 2013-06-27 NOTE — Telephone Encounter (Signed)
Pls sch appt with PCP next 3 months.

## 2013-09-12 ENCOUNTER — Encounter: Payer: Medicaid Other | Admitting: Internal Medicine

## 2013-09-14 ENCOUNTER — Encounter: Payer: Self-pay | Admitting: Dietician

## 2013-09-14 ENCOUNTER — Other Ambulatory Visit (HOSPITAL_COMMUNITY)
Admission: RE | Admit: 2013-09-14 | Discharge: 2013-09-14 | Disposition: A | Discharge status: Home / Self Care | Payer: Medicaid Other | Source: Ambulatory Visit | Attending: Internal Medicine | Admitting: Internal Medicine

## 2013-09-14 ENCOUNTER — Ambulatory Visit (INDEPENDENT_AMBULATORY_CARE_PROVIDER_SITE_OTHER): Payer: Medicaid Other | Admitting: Dietician

## 2013-09-14 ENCOUNTER — Encounter: Payer: Self-pay | Admitting: Internal Medicine

## 2013-09-14 ENCOUNTER — Ambulatory Visit (INDEPENDENT_AMBULATORY_CARE_PROVIDER_SITE_OTHER): Payer: Medicaid Other | Admitting: Internal Medicine

## 2013-09-14 VITALS — BP 116/79 | HR 86 | Temp 98.6°F | Ht 63.0 in | Wt 206.4 lb

## 2013-09-14 DIAGNOSIS — I10 Essential (primary) hypertension: Secondary | ICD-10-CM

## 2013-09-14 DIAGNOSIS — Z0000 Encounter for general adult medical examination without abnormal findings: Secondary | ICD-10-CM

## 2013-09-14 DIAGNOSIS — Z1151 Encounter for screening for human papillomavirus (HPV): Secondary | ICD-10-CM | POA: Insufficient documentation

## 2013-09-14 DIAGNOSIS — E669 Obesity, unspecified: Secondary | ICD-10-CM

## 2013-09-14 DIAGNOSIS — Z01419 Encounter for gynecological examination (general) (routine) without abnormal findings: Secondary | ICD-10-CM | POA: Insufficient documentation

## 2013-09-14 MED ORDER — OMEPRAZOLE 40 MG PO CPDR: DELAYED_RELEASE_CAPSULE | ORAL | Status: DC

## 2013-09-14 MED ORDER — LISINOPRIL 40 MG PO TABS: ORAL_TABLET | ORAL | Status: DC

## 2013-09-14 MED ORDER — TRIAMTERENE-HCTZ 37.5-25 MG PO TABS: ORAL_TABLET | ORAL | Status: DC

## 2013-09-14 NOTE — Progress Notes (Signed)
Case discussed with Dr. Emokpae at the time of the visit.  We reviewed the resident's history and exam and pertinent patient test results.  I agree with the assessment, diagnosis, and plan of care documented in the resident's note. 

## 2013-09-14 NOTE — Progress Notes (Signed)
Medical Nutrition Therapy:  Appt start time: 1050 end time:  1130 AM.  Assessment:  Primary concerns today: Weight management.  Weight increasing since ~2011 when started on chronic prednisone. Gained ~10 #/year for past 4 years. Reports trying to cook/eat a balanced meal each day and also drinks  many sugared beverages which she began decreasing in the past month..   Usual eating pattern includes 1-2 meals and 3+ snacks per day. Frequent foods include [employer] sweet tea, juice, Mt Dew, coffee with 3+ tsp sugar and cream, swiss rolls and cakes, greens, cornbread  Avoided foods include lactose intolerant but still drinks some 1-2% milk, fried foods.   24-hr recall: (Up at  6:45 -8:30 AM) B ( AM)- toast and juice or applesauce with meds,     L ( PM)- doesn't usually feel well to eat lunch but will try to eat graham crackers or Ritz crackers with cheese with a Anheuser-BuschMountain Dew.  D ( PM)- baked fish, greens, baked potato.  Eats a couple of bits.  Sometimes will eat a bowl of rasin brand as her meal.  Snk - snacks throughout day  Between meals.  Snacks include chocolate cake, swiss rolls, and strawberry short cake, fruit.  She drinks 2-3 (32oz) [employer] sweet teas. Drinks a mountain dew before she goes to bed.   Usual physical activity includes cleaning the house. She reports that she gets fatigued and out of breath with little exertion.    Progress Towards Goal(s):  In progress.   Nutritional Diagnosis:  NB-1.1 Food and nutrition-related knowledge deficit As related to lack of sufficient prior exposure to nutrtion related education.  As evidenced by weight gain, lack of knowlege about calories density of food/beverages..    Intervention:  Nutrition education about weight loss principles and calorie density of beverages. .  Monitoring/Evaluation:  Dietary intake, exercise, and body weight in 3 week(s).

## 2013-09-14 NOTE — Assessment & Plan Note (Addendum)
Counseled about the dangers of obesity and the importance of weight control despite being on chronic steroids. Pt open to referral to nutrition.   Plan- Referral to Nutrition today.

## 2013-09-14 NOTE — Progress Notes (Signed)
Patient ID: Alexandria White, female   DOB: 05/08/1965, 48 y.o.   MRN: [id number]   Subjective:   Patient ID: Alexandria White female   DOB: 05/08/1965 48 y.o.   MRN: [id number]  HPI: Alexandria White is a 48 y.o. with past medical history of hypertension and autoimmune hepatitis, with the left cirrhosis. Patient [facility] hepatologist at [facility]. Patient came in today for routine office visit, and refill of medication. No complaints today.   Past Medical History  Diagnosis Date  . Cystic acne   . 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 // AFP 15 (03/2009)  // Was changed from azathioprine bc unable to achieve remission --> now chronically on Cellcept and prednisone since May 2011// Followed by Dr . Christella HartiganJacobs ([facility]), and now with Dr. Larey DresserAlastair Smith ([facility])  . 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 Perimenopausal   . Macrocytosis without anemia     BL MCV 102-111. Unclear cause. Possibly due to hypothyroidism vs liver disease (although no clear history of alcohol abuse)  // RBC folate and B12 are within normal limits  . Tobacco abuse   . HTN (hypertension)   . GERD (gastroesophageal reflux disease)     EGD (10/2010) - showing mild gastritis, bx negative - by Dr. Christella HartiganJacobs  . Headache(784.0)     tension with features of migraine  . History of rectal bleeding     2/2 hemorrhoids, colonoscopy 2008 --> next due 2018  . Hemorrhoids     Small internal and external hemorrhoids noted on colonoscopy (05/2006) - thought to be the cause of the rectal bleeding that she noted.   . Liver cirrhosis secondary to NASH (nonalcoholic steatohepatitis)   . Migraines    Current Outpatient Prescriptions  Medication Sig Dispense Refill  . benzonatate (TESSALON) 200 MG capsule Take 1  capsule (200 mg total) by mouth 3 (three) times daily as needed for cough.  20 capsule  0  . cholecalciferol (VITAMIN D) 1000 UNITS tablet Take 1 tablet (1,000 Units total) by mouth daily.  100 tablet  2  . lisinopril (PRINIVIL,ZESTRIL) 40 MG tablet TAKE 1 TABLET (40 MG TOTAL) BY MOUTH DAILY.  90 tablet  3  . loratadine (CLARITIN) 10 MG tablet Take 1 tablet (10 mg total) by mouth daily.  60 tablet  3  . mycophenolate (CELLCEPT) 250 MG capsule Take 4 capsules by mouth 2 (two) times daily. 4 cap by mouth 2 times a day      . omeprazole (PRILOSEC) 40 MG capsule TAKE ONE CAPSULE BY MOUTH TWICE A DAY BEFORE A MEAL  60 capsule  2  . predniSONE (DELTASONE) 5 MG tablet Take 20 mg by mouth daily. Take 4 tablets (20 mg total) by mouth daily.      Marland Kitchen. triamterene-hydrochlorothiazide (MAXZIDE-25) 37.5-25 MG per tablet TAKE 1 TABLET BY MOUTH DAILY.  90 tablet  3   No current facility-administered medications for this visit.   Family History  Problem Relation Age of Onset  . Diabetes Mother   . Migraines Mother   . Cancer Brother   . Colon cancer Neg Hx   . Colon polyps Neg Hx   . Rectal cancer Neg Hx   . Stomach cancer Neg  Hx    History   Social History  . Marital Status: Single    Spouse Name: [name]    Number of Children: 6  . Years of Education: [name]   Social History Main Topics  . Smoking status: Current Every Day Smoker -- 0.50 packs/day for 3 years    Types: Cigarettes  . Smokeless tobacco: Never Used     Comment: 8-9 cigs/day.  Cutting back.  6-7 cigerettes per day  . Alcohol Use: No  . Drug Use: No  . Sexual Activity: None   Other Topics Concern  . 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 SKIN- No Rash, colour changes or itching. HEAD- No Headache or dizziness. EYES- No Vision loss, pain, redness, double or blurred vision. EARS- No vertigo, hearing loss or ear discharge. Mouth/throat- No Sorethroat, dentures, or bleeding gums. RESPIRATORY- No Cough or SOB. CARDIAC- No Palpitations, DOE, PND or chest pain. GI- No nausea, vomiting, diarrhoea, constipation, endorse mild right upper quad pain- chronic, abd pain. URINARY- No Frequency, urgency, straining or dysuria. NEUROLOGIC- No Numbness, syncope, seizures or burning. [facility]- Denies depression or anxiety.  Objective:  Physical Exam: Filed Vitals:   09/14/13 0912  BP: 116/79  Pulse: 86  Temp: 98.6 F (37 C)  TempSrc: Oral  Height: 5\' 3"  (1.6 m)  Weight: 206 lb 6.4 oz (93.622 kg)  SpO2: 97%   GENERAL- alert, co-operative, appears as stated age, not in any distress. HEENT- Atraumatic, normocephalic, PERRL, EOMI, oral mucosa appears moist, good and intact dentition. No carotid bruit, no cervical LN enlargement, thyroid does not appear enlarged, neck supple. CARDIAC- RRR, no murmurs, rubs or gallops. RESP- Moving equal volumes of air, and clear to auscultation bilaterally, no wheezes or crackles. ABDOMEN- Soft, nontender,  no palpable masses or organomegaly, bowel sounds present. BACK- Normal curvature of the spine, No tenderness along the vertebrae, no CVA tenderness. NEURO- No obvious Cr N abnormality, strenght upper and lower extremities- 5/5,  Gait- Normal. EXTREMITIES- pulse 2+, symmetric, no pedal edema. SKIN- Warm, dry, No rash or lesion. PSYCH- Normal mood and affect, appropriate thought content and speech.  Assessment & Plan:   The patient's case and  plan of care was discussed with attending physician, Dr. Irine SealE. Butcher.  Please see problem based charting for assessment and plan.

## 2013-09-14 NOTE — Assessment & Plan Note (Signed)
BP Readings from Last 3 Encounters:  09/14/13 116/79  02/28/13 129/85  11/25/12 138/82    Lab Results  Component Value Date   NA 140 02/28/2013   K 3.6 02/28/2013   CREATININE 0.87 02/28/2013    Assessment: Blood pressure control:  Well controlled Progress toward BP goal:   At goal Comments: Complaint, asked for refills today.  Plan: Medications:  continue current medications Educational resources provided:   Self management tools provided:   Other plans: None

## 2013-09-14 NOTE — Assessment & Plan Note (Signed)
Pap smear today. Pt said she will call and schedule her mammogram.

## 2013-09-14 NOTE — Patient Instructions (Signed)
General Instructions: We will like you to increase your exercise and activity level to help reduce your weight. This is important because, obesity increases your risk of Diabetes, elevteed blood pressure, heart attack and strokes.   We will like to refer you to our nutrition co-ordinator.   Please bring your medicines with you each time you come to clinic.  Medicines may include prescription medications, over-the-counter medications, herbal remedies, eye drops, vitamins, or other pills.   Progress Toward Treatment Goals:  Treatment Goal 11/25/2012  Blood pressure at goal  Stop smoking smoking the same amount    Self Care Goals & Plans:  Self Care Goal 02/28/2013  Manage my medications take my medicines as prescribed; bring my medications to every visit; refill my medications on time  Monitor my health -  Eat healthy foods drink diet soda or water instead of juice or soda; eat more vegetables; eat foods that are low in salt; eat baked foods instead of fried foods  Be physically active -  Stop smoking -    No flowsheet data found.   Care Management & Community Referrals:  Referral 09/08/2012  Referrals made for care management support nutritionist

## 2013-09-14 NOTE — Patient Instructions (Addendum)
Weights for next 3 weeks by your scale  1___________________  2___________________  3___________________   To cut back sugars over the next 3 weeks you said you want to eat fruit instead of the cakes...  Please make a Follow up in 3-4 weeks

## 2013-11-24 ENCOUNTER — Other Ambulatory Visit: Payer: Self-pay | Admitting: Internal Medicine

## 2013-12-12 ENCOUNTER — Encounter: Payer: Medicaid Other | Admitting: Internal Medicine

## 2013-12-12 ENCOUNTER — Ambulatory Visit (INDEPENDENT_AMBULATORY_CARE_PROVIDER_SITE_OTHER): Payer: Medicaid Other | Admitting: Internal Medicine

## 2013-12-12 VITALS — BP 118/84 | HR 107 | Temp 98.6°F | Ht 63.0 in | Wt 197.2 lb

## 2013-12-12 DIAGNOSIS — R5381 Other malaise: Secondary | ICD-10-CM | POA: Diagnosis not present

## 2013-12-12 DIAGNOSIS — F17200 Nicotine dependence, unspecified, uncomplicated: Secondary | ICD-10-CM

## 2013-12-12 DIAGNOSIS — E063 Autoimmune thyroiditis: Secondary | ICD-10-CM | POA: Diagnosis not present

## 2013-12-12 DIAGNOSIS — I10 Essential (primary) hypertension: Secondary | ICD-10-CM

## 2013-12-12 DIAGNOSIS — K7460 Unspecified cirrhosis of liver: Secondary | ICD-10-CM | POA: Diagnosis not present

## 2013-12-12 DIAGNOSIS — Z23 Encounter for immunization: Secondary | ICD-10-CM | POA: Diagnosis not present

## 2013-12-12 DIAGNOSIS — R5383 Other fatigue: Secondary | ICD-10-CM | POA: Diagnosis not present

## 2013-12-12 DIAGNOSIS — K754 Autoimmune hepatitis: Secondary | ICD-10-CM | POA: Diagnosis not present

## 2013-12-12 DIAGNOSIS — Z720 Tobacco use: Secondary | ICD-10-CM

## 2013-12-12 MED ORDER — NICOTINE 14 MG/24HR TD PT24: 14.0000 mg | MEDICATED_PATCH | TRANSDERMAL | Status: DC

## 2013-12-12 NOTE — Progress Notes (Signed)
Patient ID: Alexandria White, female   DOB: 1965/09/19, 48 y.o.   MRN: [id number]   Subjective:   Patient ID: Alexandria White female   DOB: 1965/09/19 48 y.o.   MRN: [id number]  HPI: Alexandria White is a 48 y.o. with PMH of HTN, Autoimmune hepatitis with cirrhosis, hashimotors thyroiditis, tobacco abuse. Presented today for follow up visit. Please see problem, based charting for review of chronic medical conditions.   Past Medical History  Diagnosis Date  . Cystic acne   . 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 // AFP 15 (03/2009)  // Was changed from azathioprine bc unable to achieve remission --> now chronically on Cellcept and prednisone since May 2011// Followed by Dr . Christella HartiganJacobs ([facility]), and now with Dr. Larey DresserAlastair Smith ([facility])  . 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 Perimenopausal   . Macrocytosis without anemia     BL MCV 102-111. Unclear cause. Possibly due to hypothyroidism vs liver disease (although no clear history of alcohol abuse)  // RBC folate and B12 are within normal limits  . Tobacco abuse   . HTN (hypertension)   . GERD (gastroesophageal reflux disease)     EGD (10/2010) - showing mild gastritis, bx negative - by Dr. Christella HartiganJacobs  . Headache(784.0)     tension with features of migraine  . History of rectal bleeding     2/2 hemorrhoids, colonoscopy 2008 --> next due 2018  . Hemorrhoids     Small internal and external hemorrhoids noted on colonoscopy (05/2006) - thought to be the cause of the rectal bleeding that she noted.   . Liver cirrhosis secondary to NASH (nonalcoholic steatohepatitis)   . Migraines    Current Outpatient Prescriptions  Medication Sig Dispense Refill  . benzonatate (TESSALON) 200 MG capsule Take 1 capsule (200 mg total) by mouth 3  (three) times daily as needed for cough.  20 capsule  0  . [employer] VITAMIN D3 1000 UNITS capsule TAKE ONE CAPSULE BY MOUTH EVERY DAY  100 capsule  2  . lisinopril (PRINIVIL,ZESTRIL) 40 MG tablet TAKE 1 TABLET (40 MG TOTAL) BY MOUTH DAILY.  90 tablet  3  . loratadine (CLARITIN) 10 MG tablet Take 1 tablet (10 mg total) by mouth daily.  60 tablet  3  . mycophenolate (CELLCEPT) 250 MG capsule Take 4 capsules by mouth 2 (two) times daily. 4 cap by mouth 2 times a day      . omeprazole (PRILOSEC) 40 MG capsule TAKE ONE CAPSULE BY MOUTH TWICE A DAY BEFORE A MEAL  60 capsule  2  . predniSONE (DELTASONE) 5 MG tablet Take 20 mg by mouth daily. Take 4 tablets (20 mg total) by mouth daily.      Marland Kitchen. triamterene-hydrochlorothiazide (MAXZIDE-25) 37.5-25 MG per tablet TAKE 1 TABLET BY MOUTH DAILY.  90 tablet  3   No current facility-administered medications for this visit.   Family History  Problem Relation Age of Onset  . Diabetes Mother   . Migraines Mother   . Cancer Brother   . Colon cancer Neg Hx   . Colon polyps Neg Hx   . Rectal cancer Neg Hx   . Stomach cancer Neg Hx    History   Social  History  . Marital Status: Single    Spouse Name: [name]    Number of Children: 6  . Years of Education: [name]   Social History Main Topics  . Smoking status: Current Every Day Smoker -- 0.50 packs/day for 3 years    Types: Cigarettes  . Smokeless tobacco: Never Used     Comment: 8-9 cigs/day.  Cutting back.  6-7 cigerettes per day  . Alcohol Use: No  . Drug Use: No  . Sexual Activity: Not on file   Other Topics Concern  . Not on file   Social History Narrative   Last updated: 11/28/2009    Applied for disability but was denied, currently appealing, and currently unemployed   Previously worked at [employer]   Does hair on the side    Lives alone, in a long-term relationship    6 children - living with one child currently, 68 years old, rest are grown   Smokes 4 cig daily, has cut back from 1 PPD   No  alcohol or illicit drug use    Review of Systems: CONSTITUTIONAL-Complaints of tiredness.  SKIN- No Rash, colour changes or itching. HEAD- No Headache or dizziness. RESPIRATORY- No Cough or SOB. CARDIAC- No Palpitations, DOE, PND or chest pain. GI- No nausea, vomiting, diarrhoea, constipation, abd pain. URINARY- No Frequency, urgency, straining or dysuria. [facility]- Denies depression or anxiety.  Objective:  Physical Exam: Filed Vitals:   12/12/13 1457  BP: 118/84  Pulse: 107  Temp: 98.6 F (37 C)  TempSrc: Oral  Height: 5\' 3"  (1.6 m)  Weight: 197 lb 3.2 oz (89.449 kg)  SpO2: 100%   GENERAL- alert, co-operative, appears as stated age, not in any distress. HEENT- Atraumatic, normocephalic, PERRL, EOMI, neck supple. CARDIAC- Regular. RESP- Moving equal volumes of air. ABDOMEN- Soft,  bowel sounds present. NEURO- No obvious Cr N abnormality, strenght upper and lower extremities- 5/5,  Gait- Normal. EXTREMITIES- pulse 2+, symmetric, no pedal edema. SKIN- Warm, dry, No rash or lesion. PSYCH- Normal mood and affect, appropriate thought content and speech.  Assessment & Plan:   The patient's case and plan of care was discussed with attending physician, Dr. Tenny Craw.  Please see problem based charting for assessment and plan.

## 2013-12-12 NOTE — Assessment & Plan Note (Signed)
Pt describes- been tired most of the day, sleeping a lot most of the day, also snores at night, no body stays in her house with herm, so can not ascertain Apnea hx while asleep.  Plan- TSH- low normal- Per [facility] records, Will order sleep study.

## 2013-12-12 NOTE — Assessment & Plan Note (Addendum)
Pt scheduled to have Liver biopsy at [facility]- next week. As despite been on mycophenolate and prednisone, liver enzymes are still mildly elevated.

## 2013-12-12 NOTE — Assessment & Plan Note (Signed)
Complaints of tiredness and fatigue. TSH ans T4 drawn at [facility]- 8/17- TSH- 0.8, T4- 0.95, both low normal.  Plan- Continue to follow.

## 2013-12-12 NOTE — Patient Instructions (Addendum)
General Instructions:   Please bring your medicines with you each time you come to clinic.  Medicines may include prescription medications, over-the-counter medications, herbal remedies, eye drops, vitamins, or other pills.   Progress Toward Treatment Goals:  Treatment Goal 11/25/2012  Blood pressure at goal  Stop smoking smoking the same amount    Self Care Goals & Plans:  Self Care Goal 12/12/2013  Manage my medications take my medicines as prescribed; bring my medications to every visit; refill my medications on time  Monitor my health -  Eat healthy foods eat more vegetables; eat foods that are low in salt; eat baked foods instead of fried foods  Be physically active take a walk every day  Stop smoking (No Data)    No flowsheet data found.   Care Management & Community Referrals:  Referral 09/08/2012  Referrals made for care management support nutritionist        Nicotine skin patches What is this medicine? NICOTINE (NIK oh teen) helps people stop smoking. The patches replace the nicotine found in cigarettes and help to decrease withdrawal effects. They are most effective when used in combination with a stop-smoking program. This medicine may be used for other purposes; ask your health care provider or pharmacist if you have questions. COMMON BRAND NAME(S): Habitrol, Nicoderm CQ, Nicotrol What should I tell my health care provider before I take this medicine? They need to know if you have any of these conditions: -diabetes -heart disease, angina, irregular heartbeat or previous heart attack -lung disease, including asthma -overactive thyroid -pheochromocytoma -skin problems -stomach problems or ulcers -an unusual or allergic reaction to nicotine, adhesives, other medicines, foods, dyes, or preservatives -pregnant or trying to get pregnant -breast-feeding How should I use this medicine? This medicine is for use on the skin. Follow the directions that come with the  patches. Find an area of skin on your upper arm, chest, or back that is clean, dry, greaseless, undamaged and hairless. Wash hands with plain soap and water. Do not use anything that contains aloe, lanolin or glycerin as these may prevent the patch from sticking. Dry thoroughly. Remove the patch from the sealed pouch. Do not try to cut or trim the patch. Using your palm, press the patch firmly in place for 10 seconds to make sure that there is good contact with your skin. After applying the patch, wash your hands. Change the patch every day, keeping to a regular schedule. When you apply a new patch, use a new area of skin. Wait at least 1 week before using the same area again. Talk to your pediatrician regarding the use of this medicine in children. Special care may be needed. Overdosage: If you think you have taken too much of this medicine contact a poison control center or emergency room at once. NOTE: This medicine is only for you. Do not share this medicine with others. What if I miss a dose? If you forget to replace a patch, use it as soon as you can. Only use one patch at a time and do not leave on the skin for longer than directed. If a patch falls off, you can replace it, but keep to your schedule and remove the patch at the right time. What may interact with this medicine? -medicines for asthma -medicines for blood pressure -medicines for mental depression This list may not describe all possible interactions. Give your health care provider a list of all the medicines, herbs, non-prescription drugs, or dietary supplements you use.  Also tell them if you smoke, drink alcohol, or use illegal drugs. Some items may interact with your medicine. What should I watch for while using this medicine? You should begin using the nicotine patch the day you stop smoking. It is okay if you do not succeed at your attempt to quit and have a cigarette. You can still continue your quit attempt and keep using the  product as directed. Just throw away your cigarettes and get back to your quit plan. You can keep the patch in place during swimming, bathing, and showering. If your patch falls off during these activities, replace it. When you first apply the patch, your skin may itch or burn. This should soon go away. When you remove a patch, the skin may look red, but this should only last for a day. Call your doctor or health care professional if you get a permanent skin rash. If you are a diabetic and you quit smoking, the effects of insulin may be increased and you may need to reduce your insulin dose. Check with your doctor or health care professional about how you should adjust your insulin dose. If you are going to have a magnetic resonance imaging (MRI) procedure, tell your MRI technician if you have this patch on your body. It must be removed before a MRI. What side effects may I notice from receiving this medicine? Side effects that you should report to your doctor or health care professional as soon as possible: -allergic reactions like skin rash, itching or hives, swelling of the face, lips, or tongue -breathing problems -changes in hearing -changes in vision -chest pain -cold sweats -confusion -fast, irregular heartbeat -feeling faint or lightheaded, falls -headache -increased saliva -nausea, vomiting -skin redness that lasts more than 4 days -stomach pain -weakness Side effects that usually do not require medical attention (report to your doctor or health care professional if they continue or are bothersome): -diarrhea -dry mouth -hiccups -irritability -nervousness or restlessness -trouble sleeping or vivid dreams This list may not describe all possible side effects. Call your doctor for medical advice about side effects. You may report side effects to FDA at 1-800-FDA-1088. Where should I keep my medicine? Keep out of the reach of children. Store at room temperature between 20 and 25  degrees C (68 and 77 degrees F). Protect from heat and light. Store in Tax inspector until ready to use. Throw away unused medicine after the expiration date. When you remove a patch, fold with sticky sides together; put in an empty opened pouch and throw away.   Smoking Cessation Quitting smoking is important to your health and has many advantages. However, it is not always easy to quit since nicotine is a very addictive drug. Oftentimes, people try 3 times or more before being able to quit. This document explains the best ways for you to prepare to quit smoking. Quitting takes hard work and a lot of effort, but you can do it. ADVANTAGES OF QUITTING SMOKING  You will live longer, feel better, and live better.  Your body will feel the impact of quitting smoking almost immediately.  Within 20 minutes, blood pressure decreases. Your pulse returns to its normal level.  After 8 hours, carbon monoxide levels in the blood return to normal. Your oxygen level increases.  After 24 hours, the chance of having a heart attack starts to decrease. Your breath, hair, and body stop smelling like smoke.  After 48 hours, damaged nerve endings begin to recover. Your sense of  taste and smell improve.  After 72 hours, the body is virtually free of nicotine. Your bronchial tubes relax and breathing becomes easier.  After 2 to 12 weeks, lungs can hold more air. Exercise becomes easier and circulation improves.  The risk of having a heart attack, stroke, cancer, or lung disease is greatly reduced.  After 1 year, the risk of coronary heart disease is cut in half.  After 5 years, the risk of stroke falls to the same as a nonsmoker.  After 10 years, the risk of lung cancer is cut in half and the risk of other cancers decreases significantly.  After 15 years, the risk of coronary heart disease drops, usually to the level of a nonsmoker.  If you are pregnant, quitting smoking will improve your chances  of having a healthy baby.  The people you live with, especially any children, will be healthier.  You will have extra money to spend on things other than cigarettes. QUESTIONS TO THINK ABOUT BEFORE ATTEMPTING TO QUIT You may want to talk about your answers with your health care provider.  Why do you want to quit?  If you tried to quit in the past, what helped and what did not?  What will be the most difficult situations for you after you quit? How will you plan to handle them?  Who can help you through the tough times? Your family? Friends? A health care provider?  What pleasures do you get from smoking? What ways can you still get pleasure if you quit? Here are some questions to ask your health care provider:  How can you help me to be successful at quitting?  What medicine do you think would be best for me and how should I take it?  What should I do if I need more help?  What is smoking withdrawal like? How can I get information on withdrawal? GET READY  Set a quit date.  Change your environment by getting rid of all cigarettes, ashtrays, matches, and lighters in your home, car, or work. Do not let people smoke in your home.  Review your past attempts to quit. Think about what worked and what did not. GET SUPPORT AND ENCOURAGEMENT You have a better chance of being successful if you have help. You can get support in many ways.  Tell your family, friends, and coworkers that you are going to quit and need their support. Ask them not to smoke around you.  Get individual, group, or telephone counseling and support. Programs are available at [employer] and health centers. Call your local health department for information about programs in your area.  Spiritual beliefs and practices may help some smokers quit.  Download a "quit meter" on your computer to keep track of quit statistics, such as how long you have gone without smoking, cigarettes not smoked, and money  saved.  Get a self-help book about quitting smoking and staying off tobacco. LEARN NEW SKILLS AND BEHAVIORS  Distract yourself from urges to smoke. Talk to someone, go for a walk, or occupy your time with a task.  Change your normal routine. Take a different route to work. Drink tea instead of coffee. Eat breakfast in a different place.  Reduce your stress. Take a hot bath, exercise, or read a book.  Plan something enjoyable to do every day. Reward yourself for not smoking.  Explore interactive web-based programs that specialize in helping you quit. GET MEDICINE AND USE IT CORRECTLY Medicines can help you stop smoking  and decrease the urge to smoke. Combining medicine with the above behavioral methods and support can greatly increase your chances of successfully quitting smoking.  Nicotine replacement therapy helps deliver nicotine to your body without the negative effects and risks of smoking. Nicotine replacement therapy includes nicotine gum, lozenges, inhalers, nasal sprays, and skin patches. Some may be available over-the-counter and others require a prescription.  Antidepressant medicine helps people abstain from smoking, but how this works is unknown. This medicine is available by prescription.  Nicotinic receptor partial agonist medicine simulates the effect of nicotine in your brain. This medicine is available by prescription. Ask your health care provider for advice about which medicines to use and how to use them based on your health history. Your health care provider will tell you what side effects to look out for if you choose to be on a medicine or therapy. Carefully read the information on the package. Do not use any other product containing nicotine while using a nicotine replacement product.  RELAPSE OR DIFFICULT SITUATIONS Most relapses occur within the first 3 months after quitting. Do not be discouraged if you start smoking again. Remember, most people try several times  before finally quitting. You may have symptoms of withdrawal because your body is used to nicotine. You may crave cigarettes, be irritable, feel very hungry, cough often, get headaches, or have difficulty concentrating. The withdrawal symptoms are only temporary. They are strongest when you first quit, but they will go away within 10-14 days. To reduce the chances of relapse, try to:  Avoid drinking alcohol. Drinking lowers your chances of successfully quitting.  Reduce the amount of caffeine you consume. Once you quit smoking, the amount of caffeine in your body increases and can give you symptoms, such as a rapid heartbeat, sweating, and anxiety.  Avoid smokers because they can make you want to smoke.  Do not let weight gain distract you. Many smokers will gain weight when they quit, usually less than 10 pounds. Eat a healthy diet and stay active. You can always lose the weight gained after you quit.  Find ways to improve your mood other than smoking. FOR MORE INFORMATION  www.smokefree.gov

## 2013-12-12 NOTE — Assessment & Plan Note (Signed)
BP Readings from Last 3 Encounters:  12/12/13 118/84  09/14/13 116/79  02/28/13 129/85    Lab Results  Component Value Date   NA 140 02/28/2013   K 3.6 02/28/2013   CREATININE 0.87 02/28/2013    Assessment: Blood pressure control:  Controlled Progress toward BP goal:   At goal Comments: Compliant with Triamterene-HCTZ- 37.5-25mg  daily and lisinopril- 40mg  daily. As per care everywhere, pt had Electrolytes drawn at [facility]- 12/06/2013- Na- 138, K- 3.7, Cl- 104, Co2- 26, BUN- 14, Cr- 1.1.   Plan: Medications:  continue current medications Other plans: None.

## 2013-12-12 NOTE — Assessment & Plan Note (Addendum)
  Assessment: Progress toward smoking cessation:   Ready to quit Barriers to progress toward smoking cessation:    never tried to quit in the past Comments: Smoking 8-9 a day.  Plan: Instruction/counseling given:  I counseled patient on the dangers of tobacco use, advised patient to stop smoking, and reviewed strategies to maximize success. Educational resources provided: Yes   Self management tools provided: Yes   Medications to assist with smoking cessation:  Nicotine Patch Patient agreed to the following self-care plans for smoking cessation:  (TRYING TO QUIT)  Other plans: Prescription given for nicotine patch.

## 2013-12-13 NOTE — Progress Notes (Signed)
INTERNAL MEDICINE TEACHING ATTENDING ADDENDUM - Rosalind Guido, MD: I reviewed and discussed at the time of visit with the resident Dr. Emokpae, the patient's medical history, physical examination, diagnosis and results of pertinent tests and treatment and I agree with the patient's care as documented.  

## 2013-12-19 NOTE — Addendum Note (Signed)
Addended by: Onnie Boer on: 12/19/2013 05:12 PM   Modules accepted: Orders

## 2013-12-19 NOTE — Addendum Note (Signed)
Addended by: Onnie Boer on: 12/19/2013 01:26 PM   Modules accepted: Orders

## 2013-12-29 ENCOUNTER — Other Ambulatory Visit: Payer: Self-pay | Admitting: Internal Medicine

## 2014-01-17 ENCOUNTER — Ambulatory Visit (INDEPENDENT_AMBULATORY_CARE_PROVIDER_SITE_OTHER): Payer: Medicaid Other | Admitting: *Deleted

## 2014-01-17 DIAGNOSIS — Z23 Encounter for immunization: Secondary | ICD-10-CM

## 2014-01-17 DIAGNOSIS — K754 Autoimmune hepatitis: Secondary | ICD-10-CM | POA: Diagnosis not present

## 2014-01-25 ENCOUNTER — Ambulatory Visit (HOSPITAL_BASED_OUTPATIENT_CLINIC_OR_DEPARTMENT_OTHER): Payer: Medicaid Other | Attending: Internal Medicine | Admitting: Radiology

## 2014-01-25 VITALS — Ht 61.5 in | Wt 195.0 lb

## 2014-01-25 DIAGNOSIS — R0683 Snoring: Secondary | ICD-10-CM | POA: Insufficient documentation

## 2014-01-25 DIAGNOSIS — R5383 Other fatigue: Secondary | ICD-10-CM | POA: Diagnosis present

## 2014-01-25 DIAGNOSIS — G4710 Hypersomnia, unspecified: Secondary | ICD-10-CM | POA: Diagnosis present

## 2014-01-25 DIAGNOSIS — G4733 Obstructive sleep apnea (adult) (pediatric): Secondary | ICD-10-CM | POA: Insufficient documentation

## 2014-01-25 DIAGNOSIS — G4730 Sleep apnea, unspecified: Secondary | ICD-10-CM | POA: Diagnosis present

## 2014-01-27 DIAGNOSIS — R5383 Other fatigue: Secondary | ICD-10-CM | POA: Diagnosis not present

## 2014-01-27 DIAGNOSIS — G4730 Sleep apnea, unspecified: Secondary | ICD-10-CM | POA: Diagnosis not present

## 2014-01-27 NOTE — Sleep Study (Signed)
   NAME: Alexandria White DATE OF BIRTH:  1965/12/29 MEDICAL RECORD NUMBER [id number]  LOCATION: [facility]  PHYSICIAN: YOUNG,CLINTON D  DATE OF STUDY: 01/25/2014  SLEEP STUDY TYPE: Nocturnal Polysomnogram               REFERRING PHYSICIAN: Emokpae, Ejiroghene E, *  INDICATION FOR STUDY: Hypersomnia with sleep apnea  EPWORTH SLEEPINESS SCORE:   18/24 HEIGHT: 5' 1.5" (156.2 cm)  WEIGHT: 195 lb (88.451 kg)    Body mass index is 36.25 kg/(m^2).  NECK SIZE: 13.5 in.  MEDICATIONS: Charted for review  SLEEP ARCHITECTURE: Total sleep time 325.5 minutes with sleep efficiency 86.5%. Stage I was 12.4%, stage II 71.9%, stage III absent, REM 15.7% of total sleep time. Sleep latency 17.5 minutes, REM latency 193.5 minutes, awake after sleep onset 33 minutes, arousal index 18.4, bedtime medication: None  RESPIRATORY DATA: Apnea hypopneas index (AHI) 6.8 per hour. 37 total events scored including 17 obstructive apneas and 20 hypopneas. Most events were while supine. REM AHI 34.1 per hour. This study was ordered as a diagnostic polysomnogram without CPAP.  OXYGEN DATA: Variable snoring, mild to occasionally loud, with oxygen desaturation to a nadir of 81% and mean saturation 94.4% on room air.  CARDIAC DATA: Normal sinus rhythm  MOVEMENT/PARASOMNIA: No significant movement disturbance, bathroom x1  IMPRESSION/ RECOMMENDATION:   1) Mild obstructive sleep apnea/hypopneas syndrome, AHI 6.8 per hour with mostly supine events. REM AHI 34.1 per hour. Mild to occasionally loud snoring with oxygen desaturation to a nadir of 81% and mean saturation 94.4% on room air. 2) Scores in this range may respond to conservative measures including weight loss and encouragement to sleep off flat of back. On an individual basis, CPAP or an oral appliance might be alternatives. This patient could return for a dedicated CPAP titration study if appropriate.  Waymon BudgeYOUNG,CLINTON D Diplomate, American  Board of Sleep Medicine  ELECTRONICALLY SIGNED ON:  01/27/2014, 11:51 AM [facility] PH: [phone]   FX: [phone] ACCREDITED BY THE AMERICAN ACADEMY OF SLEEP MEDICINE

## 2014-02-20 ENCOUNTER — Ambulatory Visit (INDEPENDENT_AMBULATORY_CARE_PROVIDER_SITE_OTHER): Payer: Medicaid Other | Admitting: Internal Medicine

## 2014-02-20 ENCOUNTER — Encounter: Payer: Self-pay | Admitting: Internal Medicine

## 2014-02-20 VITALS — BP 115/72 | HR 79 | Temp 98.1°F | Ht 63.0 in | Wt 196.7 lb

## 2014-02-20 DIAGNOSIS — R5383 Other fatigue: Secondary | ICD-10-CM

## 2014-02-20 DIAGNOSIS — Z23 Encounter for immunization: Secondary | ICD-10-CM

## 2014-02-20 DIAGNOSIS — Z720 Tobacco use: Secondary | ICD-10-CM

## 2014-02-20 DIAGNOSIS — K754 Autoimmune hepatitis: Secondary | ICD-10-CM

## 2014-02-20 NOTE — Assessment & Plan Note (Addendum)
With hx of cirrhosis. No symptoms refferable to decompensation. Presently on steriod taper- down from 20mg  to 10mg  daily of prednisone. Pt presently getting 2 weekly CMETs.  Pt [facility]   Hep B vaccine series started 12/12/2013, got second dose- 01/17/2014, due for 3rd dose in February. Has received hep A vaccine.

## 2014-02-20 NOTE — Assessment & Plan Note (Signed)
Pt tried to quit with Nicotine patch-prescribed at her last visit- 12/12/2013. She says she applied it for 2 days, quit and then restarted, she doesn't know why, except that she had craving for cigs. Pt says she wants to try cutting down, gradually reduce the amount. Presently still smoking 8-9 cigs per day, previously smoked 2 cigs per day. Pt does not want to try any meds to quit.

## 2014-02-20 NOTE — Assessment & Plan Note (Signed)
Sleep study showed mild sleep apnea. Pt encouraged to sleep off back, on her side.

## 2014-02-20 NOTE — Patient Instructions (Addendum)
General Instructions:  Please continue following up with your Liver doctor. If you have an new complaints let us know. Come back in February for your last hep B vaccine.   Please bring your medicines with you each time you come to clinic.  Medicines may include prescription medications, over-the-counter medications, herbal remedies, eye drops, vitamins, or other pills.

## 2014-02-20 NOTE — Progress Notes (Signed)
Patient ID: Alexandria White, female   DOB: [birth year], 48 y.o.   MRN: [id number]   Subjective:   Patient ID: Alexandria White female   DOB: [birth year] 48 y.o.   MRN: [id number]  HPI: Alexandria White is a 48 y.o. with PMH listed below presented for follow up visit, know the results of her sleep study and get the flu shot.  Past Medical History  Diagnosis Date  . Cystic acne   . 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 // AFP 15 (03/2009)  // Was changed from azathioprine bc unable to achieve remission --> now chronically on Cellcept and prednisone since May 2011// Followed by Dr . Christella HartiganJacobs ([facility]), and now with Dr. Larey DresserAlastair Smith ([facility])  . 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 Perimenopausal   . Macrocytosis without anemia     BL MCV 102-111. Unclear cause. Possibly due to hypothyroidism vs liver disease (although no clear history of alcohol abuse)  // RBC folate and B12 are within normal limits  . Tobacco abuse   . HTN (hypertension)   . GERD (gastroesophageal reflux disease)     EGD (10/2010) - showing mild gastritis, bx negative - by Dr. Christella HartiganJacobs  . Headache(784.0)     tension with features of migraine  . History of rectal bleeding     2/2 hemorrhoids, colonoscopy 2008 --> next due 2018  . Hemorrhoids     Small internal and external hemorrhoids noted on colonoscopy (05/2006) - thought to be the cause of the rectal bleeding that she noted.   . Liver cirrhosis secondary to NASH (nonalcoholic steatohepatitis)   . Migraines    Current Outpatient Prescriptions  Medication Sig Dispense Refill  . CVS VITAMIN D3 1000 UNITS capsule TAKE ONE CAPSULE BY MOUTH EVERY DAY  100 capsule  2  . lisinopril (PRINIVIL,ZESTRIL) 40 MG tablet TAKE 1 TABLET (40 MG TOTAL) BY MOUTH DAILY.   90 tablet  3  . loratadine (CLARITIN) 10 MG tablet Take 1 tablet (10 mg total) by mouth daily.  60 tablet  3  . mycophenolate (CELLCEPT) 250 MG capsule Take 4 capsules by mouth 2 (two) times daily. 4 cap by mouth 2 times a day      . nicotine (NICODERM CQ - DOSED IN MG/24 HOURS) 14 mg/24hr patch Place 1 patch (14 mg total) onto the skin daily.  30 patch  0  . omeprazole (PRILOSEC) 40 MG capsule TAKE ONE CAPSULE BY MOUTH TWICE A DAY BEFORE A MEAL  60 capsule  2  . predniSONE (DELTASONE) 5 MG tablet Take 20 mg by mouth daily. Take 4 tablets (20 mg total) by mouth daily.      Marland Kitchen. triamterene-hydrochlorothiazide (MAXZIDE-25) 37.5-25 MG per tablet TAKE 1 TABLET BY MOUTH DAILY.  90 tablet  3   No current facility-administered medications for this visit.   Family History  Problem Relation Age of Onset  . Diabetes Mother   . Migraines Mother   . Cancer Brother   . Colon cancer Neg Hx   . Colon polyps Neg Hx   . Rectal cancer Neg Hx   . Stomach cancer Neg Hx    History   Social History  . Marital Status: Single  Spouse Name: [name]    Number of Children: 6  . Years of Education: [name]   Social History Main Topics  . Smoking status: Current Every Day Smoker -- 0.50 packs/day for 3 years    Types: Cigarettes  . Smokeless tobacco: Never Used     Comment: 8-9 cigs/day.  Cutting back.  6-7 cigerettes per day  . Alcohol Use: No  . Drug Use: No  . Sexual Activity: None   Other Topics Concern  . None   Social History Narrative   Last updated: 11/28/2009    Applied for disability but was denied, currently appealing, and currently unemployed   Previously worked at [employer]   Does hair on the side    Lives alone, in a long-term relationship    6 children - living with one child currently, 48 years old, rest are grown   Smokes 4 cig daily, has cut back from 1 PPD   No alcohol or illicit drug use    Review of Systems: CONSTITUTIONAL- No Fever, weightloss, night sweat or change in  appetite. SKIN- No Rash, colour changes or itching. HEAD- No Headache or dizziness. Mouth/throat- No Sorethroat, dentures, or bleeding gums. RESPIRATORY- No Cough or SOB. CARDIAC- No Palpitations, DOE, PND or chest pain. GI- No nausea, vomiting, diarrhoea, constipation, abd pain. URINARY- No Frequency, urgency, straining or dysuria. NEUROLOGIC- No Numbness, syncope, seizures or burning. [facility]- Denies depression or anxiety.  Objective:  Physical Exam: Filed Vitals:   02/20/14 1540  BP: 115/72  Pulse: 79  Temp: 98.1 F (36.7 C)  TempSrc: Oral  Height: 5\' 3"  (1.6 m)  Weight: 196 lb 11.2 oz (89.223 kg)  SpO2: 99%   GENERAL- alert, co-operative, truncal obese, appears as stated age, not in any distress. HEENT- Anicteric, Atraumatic, normocephalic, PERRL, EOMI, oral mucosa appears moist, no cervical LN enlargement, thyroid does not appear enlarged, neck supple. CARDIAC- RRR, no murmurs, rubs or gallops. RESP- Moving equal volumes of air, and clear to auscultation bilaterally, no wheezes or crackles. ABDOMEN- Soft, nontender, no change from prior, bowel sounds present. NEURO- No obvious Cr N abnormality, strenght upper and lower extremities-Normal, Gait- Normal. EXTREMITIES- pulse 2+, symmetric, no pedal edema. SKIN- Warm, dry, No rash or lesion. PSYCH- Normal mood and affect, appropriate thought content and speech.  Assessment & Plan:  The patient's case and plan of care was discussed with attending physician, Dr. Josem KaufmannKlima.  Please see problem based charting for assessment and plan.

## 2014-02-21 NOTE — Progress Notes (Signed)
Case discussed with Dr. Emokpae soon after the resident saw the patient. We reviewed the resident's history and exam and pertinent patient test results. I agree with the assessment, diagnosis, and plan of care documented in the resident's note. 

## 2014-02-21 NOTE — Addendum Note (Signed)
Addended by: Doneen PoissonKLIMA, Kasen Sako D on: 02/21/2014 02:11 PM   Modules accepted: Level of Service

## 2014-04-18 ENCOUNTER — Other Ambulatory Visit: Payer: Self-pay | Admitting: Internal Medicine

## 2014-04-18 DIAGNOSIS — K219 Gastro-esophageal reflux disease without esophagitis: Secondary | ICD-10-CM

## 2014-04-18 NOTE — Telephone Encounter (Signed)
Will ask Dr. Mariea ClontsEmokpae to address the appropriate dose of the PPI at the follow-up visit.  It does not look like this has been reassessed since 2012 and she may no longer require BID therapy of PPI therapy over H2 blocker therapy.  No additional refills were provided to assure this topic is addressed.  Will ask that patient be scheduled with Dr. Mariea ClontsEmokpae for such a discussion at her next available, non-overbook appointment.

## 2014-04-21 ENCOUNTER — Other Ambulatory Visit: Payer: Self-pay | Admitting: Internal Medicine

## 2014-05-10 ENCOUNTER — Encounter (HOSPITAL_COMMUNITY): Payer: Self-pay

## 2014-05-10 ENCOUNTER — Emergency Department (HOSPITAL_COMMUNITY)
Admission: EM | Admit: 2014-05-10 | Discharge: 2014-05-10 | Disposition: A | Discharge status: Home / Self Care | Payer: Medicaid Other | Attending: Emergency Medicine | Admitting: Emergency Medicine

## 2014-05-10 DIAGNOSIS — G43909 Migraine, unspecified, not intractable, without status migrainosus: Secondary | ICD-10-CM | POA: Diagnosis not present

## 2014-05-10 DIAGNOSIS — Z872 Personal history of diseases of the skin and subcutaneous tissue: Secondary | ICD-10-CM | POA: Insufficient documentation

## 2014-05-10 DIAGNOSIS — Z720 Tobacco use: Secondary | ICD-10-CM | POA: Insufficient documentation

## 2014-05-10 DIAGNOSIS — K219 Gastro-esophageal reflux disease without esophagitis: Secondary | ICD-10-CM | POA: Insufficient documentation

## 2014-05-10 DIAGNOSIS — Z8742 Personal history of other diseases of the female genital tract: Secondary | ICD-10-CM | POA: Insufficient documentation

## 2014-05-10 DIAGNOSIS — Z79899 Other long term (current) drug therapy: Secondary | ICD-10-CM | POA: Insufficient documentation

## 2014-05-10 DIAGNOSIS — R42 Dizziness and giddiness: Secondary | ICD-10-CM | POA: Insufficient documentation

## 2014-05-10 DIAGNOSIS — Z91040 Latex allergy status: Secondary | ICD-10-CM | POA: Diagnosis not present

## 2014-05-10 DIAGNOSIS — Z862 Personal history of diseases of the blood and blood-forming organs and certain disorders involving the immune mechanism: Secondary | ICD-10-CM | POA: Diagnosis not present

## 2014-05-10 DIAGNOSIS — I10 Essential (primary) hypertension: Secondary | ICD-10-CM | POA: Diagnosis not present

## 2014-05-10 LAB — CBC WITH DIFFERENTIAL/PLATELET
BASOS PCT: 1 % (ref 0–1)
Basophils Absolute: 0 10*3/uL (ref 0.0–0.1)
EOS ABS: 0.1 10*3/uL (ref 0.0–0.7)
Eosinophils Relative: 1 % (ref 0–5)
HEMATOCRIT: 39.4 % (ref 36.0–46.0)
HEMOGLOBIN: 13.4 g/dL (ref 12.0–15.0)
LYMPHS PCT: 23 % (ref 12–46)
Lymphs Abs: 1.6 10*3/uL (ref 0.7–4.0)
MCH: 33.8 pg (ref 26.0–34.0)
MCHC: 34 g/dL (ref 30.0–36.0)
MCV: 99.5 fL (ref 78.0–100.0)
Monocytes Absolute: 0.7 10*3/uL (ref 0.1–1.0)
Monocytes Relative: 10 % (ref 3–12)
NEUTROS ABS: 4.5 10*3/uL (ref 1.7–7.7)
Neutrophils Relative %: 65 % (ref 43–77)
Platelets: 265 10*3/uL (ref 150–400)
RBC: 3.96 MIL/uL (ref 3.87–5.11)
RDW: 15 % (ref 11.5–15.5)
WBC: 6.9 10*3/uL (ref 4.0–10.5)

## 2014-05-10 LAB — COMPREHENSIVE METABOLIC PANEL
ALBUMIN: 3.9 g/dL (ref 3.5–5.2)
ALK PHOS: 242 U/L — AB (ref 39–117)
ALT: 237 U/L — ABNORMAL HIGH (ref 0–35)
AST: 411 U/L — ABNORMAL HIGH (ref 0–37)
Anion gap: 7 (ref 5–15)
BUN: 11 mg/dL (ref 6–23)
CHLORIDE: 100 meq/L (ref 96–112)
CO2: 28 mmol/L (ref 19–32)
CREATININE: 0.84 mg/dL (ref 0.50–1.10)
Calcium: 9.2 mg/dL (ref 8.4–10.5)
GFR calc Af Amer: >90 mL/min (ref >90)
GFR, EST NON AFRICAN AMERICAN: 81 mL/min — AB (ref >90)
GLUCOSE: 128 mg/dL — AB (ref 70–99)
Potassium: 3.2 mmol/L — ABNORMAL LOW (ref 3.5–5.1)
Sodium: 135 mmol/L (ref 135–145)
Total Bilirubin: 2.7 mg/dL — ABNORMAL HIGH (ref 0.3–1.2)
Total Protein: 8.1 g/dL (ref 6.0–8.3)

## 2014-05-10 MED ORDER — MECLIZINE HCL 25 MG PO TABS
25.0000 mg | ORAL_TABLET | Freq: Once | ORAL | Status: AC
  Administered 2014-05-10: 25 mg via ORAL
  Filled 2014-05-10: qty 1

## 2014-05-10 MED ORDER — MECLIZINE HCL 12.5 MG PO TABS: 12.5000 mg | ORAL_TABLET | Freq: Three times a day (TID) | ORAL | Status: DC | PRN

## 2014-05-10 NOTE — ED Provider Notes (Signed)
CSN: [id number]     Arrival date & time 05/10/14  1719 History   First MD Initiated Contact with Patient 05/10/14 1741     Chief Complaint  Patient presents with  . Dizziness     (Consider location/radiation/quality/duration/timing/severity/associated sxs/prior Treatment) HPI Comments: Patient presents today with a chief complaint of dizziness.  She describes the dizziness as feeling like the room is moving.  She reports that the dizziness has been occurring intermittently since 3 PM today.  She reports that each episode lasts for approximately one minute and then resolves without intevention.  Dizziness is brought on by changes in position.  She has never had symptoms like this before.  She has not taken anything for her symptoms.  She reports two episodes of vomiting associated with symptoms.  She denies headache, fever, chills, vision changes, abdominal pain, focal weakness, numbness, tingling, chest pain, or SOB.  Family has not noticed any confusion.  She denies any head injury or trauma.  She does have a history of Autoimmune Hepatitis and is currently followed by Dr. Christella Hartigan and also at [facility].    Patient is a 49 y.o. female presenting with dizziness. The history is provided by the patient.  Dizziness   Past Medical History  Diagnosis Date  . Cystic acne   . 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 // AFP 15 (03/2009)  // Was changed from azathioprine bc unable to achieve remission --> now chronically on Cellcept and prednisone since May 2011// Followed by Dr . Christella Hartigan ([facility]), and now with Dr. Larey Dresser ([facility])  . 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 Perimenopausal   . Macrocytosis without anemia     BL MCV 102-111. Unclear cause. Possibly due to hypothyroidism vs liver  disease (although no clear history of alcohol abuse)  // RBC folate and B12 are within normal limits  . Tobacco abuse   . HTN (hypertension)   . GERD (gastroesophageal reflux disease)     EGD (10/2010) - showing mild gastritis, bx negative - by Dr. Christella Hartigan  . Headache(784.0)     tension with features of migraine  . History of rectal bleeding     2/2 hemorrhoids, colonoscopy 2008 --> next due 2018  . Hemorrhoids     Small internal and external hemorrhoids noted on colonoscopy (05/2006) - thought to be the cause of the rectal bleeding that she noted.   . Liver cirrhosis secondary to NASH (nonalcoholic steatohepatitis)   . Migraines    Past Surgical History  Procedure Laterality Date  . Cholecystectomy  1992    laparoscopically   . Ercp    . Colonoscopy    . Tubal ligation      fibroid tumor removed from left fallopian tube  . Carpal tunnel release     Family History  Problem Relation Age of Onset  . Diabetes Mother   . Migraines Mother   . Cancer Brother   . Colon cancer Neg Hx   . Colon polyps Neg Hx   . Rectal cancer Neg Hx   . Stomach cancer Neg Hx    History  Substance Use Topics  . Smoking status: Current Every Day Smoker -- 0.50 packs/day for 3 years    Types:  Cigarettes  . Smokeless tobacco: Never Used     Comment: 8-9 cigs/day.  Cutting back.  6-7 cigerettes per day  . Alcohol Use: No   OB History    No data available     Review of Systems  Neurological: Positive for dizziness.  All other systems reviewed and are negative.     Allergies  Aspirin and Latex  Home Medications   Prior to Admission medications   Medication Sig Start Date End Date Taking? Authorizing Provider  CVS VITAMIN D3 1000 UNITS capsule TAKE ONE CAPSULE BY MOUTH EVERY DAY 11/24/13  Yes Ejiroghene E Emokpae, MD  lisinopril (PRINIVIL,ZESTRIL) 40 MG tablet TAKE 1 TABLET (40 MG TOTAL) BY MOUTH DAILY. 09/14/13  Yes Ejiroghene Wendall Stade, MD  mycophenolate (CELLCEPT) 250 MG capsule Take 4  capsules by mouth 2 (two) times daily. 4 cap by mouth 2 times a day 04/29/10  Yes Historical Provider, MD  omeprazole (PRILOSEC) 40 MG capsule Take 1 capsule (40 mg total) by mouth 2 (two) times daily. 04/18/14  Yes Rocco Serene, MD  triamterene-hydrochlorothiazide (MAXZIDE-25) 37.5-25 MG per tablet TAKE 1 TABLET BY MOUTH DAILY. 09/14/13  Yes Ejiroghene E Emokpae, MD  loratadine (CLARITIN) 10 MG tablet Take 1 tablet (10 mg total) by mouth daily. Patient not taking: Reported on 05/10/2014 11/25/12   Dow Adolph, MD  nicotine (NICODERM CQ - DOSED IN MG/24 HOURS) 14 mg/24hr patch Place 1 patch (14 mg total) onto the skin daily. Patient not taking: Reported on 05/10/2014 12/12/13   Ejiroghene Wendall Stade, MD  omeprazole (PRILOSEC) 40 MG capsule Take 1 capsule (40 mg total) by mouth daily. Patient not taking: Reported on 05/10/2014 04/27/14   Ejiroghene E Emokpae, MD   BP 114/59 mmHg  Pulse 69  Temp(Src) 97.9 F (36.6 C) (Oral)  Resp 18  SpO2 98% Physical Exam  Constitutional: She appears well-developed and well-nourished.  HENT:  Head: Normocephalic and atraumatic.  Right Ear: Tympanic membrane and ear canal normal.  Left Ear: Tympanic membrane and ear canal normal.  Mouth/Throat: Oropharynx is clear and moist.  Eyes: EOM are normal. Pupils are equal, round, and reactive to light.  Neck: Normal range of motion. Neck supple.  Cardiovascular: Normal rate, regular rhythm and normal heart sounds.   Pulmonary/Chest: Effort normal and breath sounds normal.  Musculoskeletal: Normal range of motion.  Neurological: She is alert. She has normal strength. No cranial nerve deficit or sensory deficit. Coordination and gait normal.  Skin: Skin is warm and dry.  Psychiatric: She has a normal mood and affect.  Nursing note and vitals reviewed.   ED Course  Procedures (including critical care time) Labs Review Labs Reviewed  CBC WITH DIFFERENTIAL  COMPREHENSIVE METABOLIC PANEL    Imaging Review No  results found.   EKG Interpretation   Date/Time:  Thursday May 10 2014 17:34:44 EST Ventricular Rate:  69 PR Interval:  154 QRS Duration: 78 QT Interval:  434 QTC Calculation: 465 R Axis:   24 Text Interpretation:  Sinus rhythm Abnormal R-wave progression, early  transition ed Baseline wander in lead(s) II III aVF Confirmed by TEST,  Record ([id number]) on 05/12/2014 9:06:50 AM     7:48 PM Reassessed patient.  She reports that symptoms have improved at this time.  Changes in position no longer brought on the dizziness. 10:23 PM Ambulated patient.  Ambulated without difficulty.  No ataxia.  No dizziness. MDM   Final diagnoses:  None   Patient presents today with a chief complaint of  intermittent dizziness.  She describes the dizziness as the room spinning.  Vertigo brought on by changes in position and only last a minute.  She has a normal neurological exam.  Normal gait, no ataxia.  Symptoms completely resolved after given Meclizine.  Symptoms most consistent with peripheral vertigo.  She does not have signs of Central Vertigo.  Therefore, doubt CVA.  Her LFT's were found to be elevated.  She does have a history of Hepatitis and reports that her GI physician recently weaned her off of Prednisone and made some other changes to the medication.  She was informed of the results and instructed to contact her GI physician. Feel that patient is stable for discharge.  She is in agreement with the plan.  Return precautions given.    Santiago GladHeather Uri Covey, PA-C 05/12/14 2029  Santiago GladHeather Maxima Skelton, PA-C 05/12/14 2032  Santiago GladHeather Jaki Hammerschmidt, PA-C 05/12/14 2034  Merrie RoofJohn David Wofford III, MD 05/13/14 [phone]

## 2014-05-10 NOTE — ED Notes (Signed)
Bed: WA15 Expected date:  Expected time:  Means of arrival:  Comments: EMS-dizzy 

## 2014-05-10 NOTE — ED Notes (Signed)
Per EMS- Patient had a sudden onset of dizziness, light sensitivity and movement sensitivity this afternoon. Patient had N/V x 1 this afternoon No orthostatic changes. Patient denies any pain.

## 2014-05-10 NOTE — ED Notes (Signed)
Patient c/o dizziness when walked with assistance to the bathroom.

## 2014-05-17 ENCOUNTER — Telehealth: Payer: Self-pay | Admitting: Gastroenterology

## 2014-05-17 ENCOUNTER — Encounter: Payer: Medicaid Other | Admitting: Internal Medicine

## 2014-05-17 ENCOUNTER — Encounter: Payer: Self-pay | Admitting: Internal Medicine

## 2014-05-21 NOTE — Telephone Encounter (Signed)
[facility] Christella HartiganJacobs to review regarding EGD, she will bring today and I will put on your desk

## 2014-05-29 NOTE — Telephone Encounter (Signed)
Left message on machine to call back  

## 2014-05-29 NOTE — Telephone Encounter (Signed)
[facility] Would like her to have ROV here with me, next available. Will proceed with EGD from that point.

## 2014-05-30 NOTE — Telephone Encounter (Signed)
Patient advised of Dr Larae GroomsJacob's recommendations. She verbalizes understanding and will come for an appointment on 06/08/14 @ 1:45.

## 2014-06-08 ENCOUNTER — Encounter: Payer: Self-pay | Admitting: Gastroenterology

## 2014-06-08 ENCOUNTER — Ambulatory Visit (INDEPENDENT_AMBULATORY_CARE_PROVIDER_SITE_OTHER): Payer: Medicaid Other | Admitting: Gastroenterology

## 2014-06-08 VITALS — BP 110/66 | HR 84 | Ht 61.0 in | Wt 184.4 lb

## 2014-06-08 DIAGNOSIS — K7460 Unspecified cirrhosis of liver: Secondary | ICD-10-CM

## 2014-06-08 DIAGNOSIS — K228 Other specified diseases of esophagus: Secondary | ICD-10-CM

## 2014-06-08 DIAGNOSIS — K2289 Other specified disease of esophagus: Secondary | ICD-10-CM

## 2014-06-08 NOTE — Patient Instructions (Addendum)
You will be set up for an upper endoscopy for variceal screening, [facility] with MAC sedation.  OZ:[id number]:Alexandria White

## 2014-06-08 NOTE — Progress Notes (Signed)
Review of pertinent gastrointestinal problems: 1. routine risk for colon cancer: no polyps on colonoscopy 05/2006; next colonoscopy 2018  2. Gerd: normal EGD 05/2006; symptoms well controlled on PPI; EGD 10/2010 mild H. Pylori negative gastritis.  EGD 04/2012 [facility] [place], done for dysphagia, mildly snug GE junction (no varices), was dilated with CRE balloon. 3. 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 ANA negative, AMA negative, F actin Antibody IgG Highly positive ( F actin antibody Reference Range:This ELISA assay is based on purified F-Actin IgGantibodies. IgG antibodies to F-Actin are presentin approximately: 75% of patients with autoimmune hepatitis type 1, 65% with autoimmune cholangitis, 30% with primary biliary cirrhosis, and 2% of healthy population.) . ferritin normal, hepatitis A, B, C were all negative. Monospot negative, EBV IgG positive. CT Scan showed no biliary dilation, masses. Essentially normal liver. MRCP/MRI October, 2010: cirrhosis noted, micronodular liver, no CBD stones, mild portal hypertensive changes. November, 2010 and liver biopsy showed "Active cirrhosis" I spoke with the pathologist and he said the findings were consistent with autoimmune disease. December, 2010 started on prednisone and azathioprine at 50 mg a day. Total bili decreased from 7.4-1.9 in 4 weeks' time. January 2011 azathioprine, mono therapy, liver tests seemed to increase slightly. February, 2011: liver tests increased while decreasing prednisone and starting to rely on azathioprine therapy alone. She felt poorly. Azathioprine stopped and prednisone resumed at 20 mg a day. Liver tests improved quickly. [facility] visit April, 2011: Dr. Smith agreed with the diagnosis and recommended that we try CellCept 1 g twice a day and taper steroids. May, 2011 she started CellCept 1 g twice daily. October, 2011: liver test elevated again off prednisone, put back on 30 mg  per day prednisone, continue CellCept 2 g per day (Dr. Smith). November 2000 and 11 repeat liver biopsy confirmed active autoimmune hepatitis. January 2012 poorly compliant to [facility] recommendations as well as my own total bilirubin 2.5, AST 900, ALT 600. 02/2012 under care of Dr. Smith at [facility], most recent lfts show normal INR, normal Plts, AST and ALT 50-60 range.04/2014 she had been lost to follow-up at [facility] for many months, 1-2 years, office visit January 2016 she reestablished care. Her hepatologist has taken full control of her liver disease at [facility]. He recommended she have repeat EGD for variceal screening.   HPI: This is a   very pleasant, historically noncompliant 49-year-old woman whom I last saw about a year ago at the time of an upper endoscopy.  She is seeing Dr. Pearcy at [facility].  She is on cell cept, back on prednisone for about 3 weeks. Feeling much better since on prednisone, following up with her labs routinely.    Her [facility] hepatologist asked that I repeat her EGD for variceal screening.  Past Medical History  Diagnosis Date  . Cystic acne   . Autoimmune hepatitis DX: Sept 2010    Initial labs (12/2008) - ANA /AMA neg, F actin / IgG Ab high at 53 // Cirrhosis on index liver bx (03/2009) and at least bridging fibrosis on 2nd biopsy in 01/2010. // AFP 15 (03/2009)  // Was changed from azathioprine bc unable to achieve remission --> now chronically on Cellcept and prednisone since May 2011// Followed by Dr . Velton Roselle ([facility]), and now with Dr. Alastair Smith ([facility])  . 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 Perimenopausal   . Macrocytosis without anemia       BL MCV 102-111. Unclear cause. Possibly due to hypothyroidism vs liver disease (although no clear history of alcohol abuse)  // RBC folate and B12 are within normal limits  . Tobacco abuse   . HTN (hypertension)   . GERD (gastroesophageal reflux disease)     EGD  (10/2010) - showing mild gastritis, bx negative - by Dr. Keria Widrig  . Headache(784.0)     tension with features of migraine  . History of rectal bleeding     2/2 hemorrhoids, colonoscopy 2008 --> next due 2018  . Hemorrhoids     Small internal and external hemorrhoids noted on colonoscopy (05/2006) - thought to be the cause of the rectal bleeding that she noted.   . Liver cirrhosis secondary to NASH (nonalcoholic steatohepatitis)   . Migraines   . Cataract   . Osteoarthritis     Past Surgical History  Procedure Laterality Date  . Cholecystectomy  1992    laparoscopically   . Ercp    . Colonoscopy    . Tubal ligation      fibroid tumor removed from left fallopian tube  . Carpal tunnel release      Current Outpatient Prescriptions  Medication Sig Dispense Refill  . CVS VITAMIN D3 1000 UNITS capsule TAKE ONE CAPSULE BY MOUTH EVERY DAY (Patient taking differently: TAKE  CAPSULE BY MOUTH TWICE A  DAY) 100 capsule 2  . KLOR-CON M20 20 MEQ tablet Take 1 tablet by mouth 2 (two) times daily.    . lisinopril (PRINIVIL,ZESTRIL) 40 MG tablet TAKE 1 TABLET (40 MG TOTAL) BY MOUTH DAILY. 90 tablet 3  . loratadine (CLARITIN) 10 MG tablet Take 1 tablet (10 mg total) by mouth daily. (Patient taking differently: Take 10 mg by mouth as needed. ) 60 tablet 3  . meclizine (ANTIVERT) 12.5 MG tablet Take 1 tablet (12.5 mg total) by mouth 3 (three) times daily as needed for dizziness. 10 tablet 0  . mycophenolate (CELLCEPT) 250 MG capsule Take 4 capsules by mouth 2 (two) times daily.     . omeprazole (PRILOSEC) 40 MG capsule Take 1 capsule (40 mg total) by mouth 2 (two) times daily. 60 capsule 0  . predniSONE (DELTASONE) 5 MG tablet Take 20 mg by mouth daily with breakfast.    . triamterene-hydrochlorothiazide (MAXZIDE-25) 37.5-25 MG per tablet TAKE 1 TABLET BY MOUTH DAILY. 90 tablet 3   No current facility-administered medications for this visit.    Allergies as of 06/08/2014 - Review Complete 06/08/2014   Allergen Reaction Noted  . Aspirin  09/30/2006  . Latex Rash 09/30/2006    Family History  Problem Relation Age of Onset  . Diabetes Mother   . Migraines Mother   . Cancer Brother   . Colon cancer Neg Hx   . Colon polyps Neg Hx   . Rectal cancer Neg Hx   . Stomach cancer Neg Hx     History   Social History  . Marital Status: Single    Spouse Name: [name]  . Number of Children: 6  . Years of Education: [name]   Occupational History  . Not on file.   Social History Main Topics  . Smoking status: Current Every Day Smoker -- 0.50 packs/day for 3 years    Types: Cigarettes  . Smokeless tobacco: Never Used     Comment: 8-9 cigs/day.  Cutting back.  6-7 cigerettes per day  . Alcohol Use: No  . Drug Use: No  . Sexual Activity: Not on file     Other Topics Concern  . Not on file   Social History Narrative   Last updated: 11/28/2009    Applied for disability but was denied, currently appealing, and currently unemployed   Previously worked at [employer]   Does hair on the side    Lives alone, in a long-term relationship    6 children - living with one child currently, 12 years old, rest are grown   Smokes 4 cig daily, has cut back from 1 PPD   No alcohol or illicit drug use       Physical Exam: BP 110/66 mmHg  Pulse 84  Ht 5' 1" (1.549 m)  Wt 184 lb 6 oz (83.632 kg)  BMI 34.86 kg/m2 Constitutional: Cushingoid Psychiatric: alert and oriented x3 Abdomen: soft, nontender, nondistended, no obvious ascites, no peritoneal signs, normal bowel sounds     Assessment and plan: 48 y.o. female with cirrhosis, autoimmune hepatitis, historically fairly noncompliant  Her do hepatologist asked that I performed EGD here to screen her for esophageal varices. I'm happy to do that and I will forward the results to them when available. She otherwise gets all of her hepatology, liver care at [facility]. She has been having a lot of blood tests over the past several weeks. I reviewed a note from  her do hepatologist dated about 2 or 3 weeks ago and it seemed that her transaminases had significantly elevated again as she came off of prednisone. She is back on prednisone now under their direction.  

## 2014-06-18 ENCOUNTER — Encounter (HOSPITAL_COMMUNITY): Payer: Self-pay | Admitting: *Deleted

## 2014-06-21 ENCOUNTER — Encounter (HOSPITAL_COMMUNITY): Payer: Self-pay

## 2014-06-21 ENCOUNTER — Ambulatory Visit (HOSPITAL_COMMUNITY): Payer: Medicaid Other | Admitting: Certified Registered"

## 2014-06-21 ENCOUNTER — Ambulatory Visit (HOSPITAL_COMMUNITY)
Admission: RE | Admit: 2014-06-21 | Discharge: 2014-06-21 | Disposition: A | Discharge status: Home / Self Care | Payer: Medicaid Other | Source: Ambulatory Visit | Attending: Gastroenterology | Admitting: Gastroenterology

## 2014-06-21 ENCOUNTER — Encounter (HOSPITAL_COMMUNITY)
Admission: RE | Disposition: A | Discharge status: Home / Self Care | Payer: Self-pay | Source: Ambulatory Visit | Attending: Gastroenterology

## 2014-06-21 DIAGNOSIS — D649 Anemia, unspecified: Secondary | ICD-10-CM | POA: Diagnosis not present

## 2014-06-21 DIAGNOSIS — Z9889 Other specified postprocedural states: Secondary | ICD-10-CM | POA: Insufficient documentation

## 2014-06-21 DIAGNOSIS — E063 Autoimmune thyroiditis: Secondary | ICD-10-CM | POA: Insufficient documentation

## 2014-06-21 DIAGNOSIS — Z7952 Long term (current) use of systemic steroids: Secondary | ICD-10-CM | POA: Insufficient documentation

## 2014-06-21 DIAGNOSIS — I10 Essential (primary) hypertension: Secondary | ICD-10-CM | POA: Diagnosis not present

## 2014-06-21 DIAGNOSIS — K2960 Other gastritis without bleeding: Secondary | ICD-10-CM | POA: Diagnosis not present

## 2014-06-21 DIAGNOSIS — F17210 Nicotine dependence, cigarettes, uncomplicated: Secondary | ICD-10-CM | POA: Diagnosis not present

## 2014-06-21 DIAGNOSIS — K2289 Other specified disease of esophagus: Secondary | ICD-10-CM

## 2014-06-21 DIAGNOSIS — K754 Autoimmune hepatitis: Secondary | ICD-10-CM | POA: Insufficient documentation

## 2014-06-21 DIAGNOSIS — G43909 Migraine, unspecified, not intractable, without status migrainosus: Secondary | ICD-10-CM | POA: Insufficient documentation

## 2014-06-21 DIAGNOSIS — Z9119 Patient's noncompliance with other medical treatment and regimen: Secondary | ICD-10-CM | POA: Diagnosis not present

## 2014-06-21 DIAGNOSIS — Z9049 Acquired absence of other specified parts of digestive tract: Secondary | ICD-10-CM | POA: Insufficient documentation

## 2014-06-21 DIAGNOSIS — Z79899 Other long term (current) drug therapy: Secondary | ICD-10-CM | POA: Insufficient documentation

## 2014-06-21 DIAGNOSIS — Z800 Family history of malignant neoplasm of digestive organs: Secondary | ICD-10-CM | POA: Insufficient documentation

## 2014-06-21 DIAGNOSIS — K7460 Unspecified cirrhosis of liver: Secondary | ICD-10-CM | POA: Diagnosis not present

## 2014-06-21 DIAGNOSIS — M1990 Unspecified osteoarthritis, unspecified site: Secondary | ICD-10-CM | POA: Diagnosis not present

## 2014-06-21 DIAGNOSIS — K219 Gastro-esophageal reflux disease without esophagitis: Secondary | ICD-10-CM | POA: Diagnosis not present

## 2014-06-21 DIAGNOSIS — K228 Other specified diseases of esophagus: Secondary | ICD-10-CM

## 2014-06-21 HISTORY — PX: ESOPHAGOGASTRODUODENOSCOPY (EGD) WITH PROPOFOL: SHX5813

## 2014-06-21 HISTORY — DX: Reserved for inherently not codable concepts without codable children: IMO0001

## 2014-06-21 SURGERY — ESOPHAGOGASTRODUODENOSCOPY (EGD) WITH PROPOFOL
Anesthesia: Monitor Anesthesia Care

## 2014-06-21 MED ORDER — PROPOFOL 10 MG/ML IV BOLUS
INTRAVENOUS | Status: DC | PRN
  Administered 2014-06-21 (×2): 20 mg via INTRAVENOUS
  Administered 2014-06-21: 30 mg via INTRAVENOUS
  Administered 2014-06-21: 50 mg via INTRAVENOUS

## 2014-06-21 MED ORDER — PROPOFOL 10 MG/ML IV BOLUS
INTRAVENOUS | Status: AC
  Filled 2014-06-21: qty 20

## 2014-06-21 MED ORDER — SODIUM CHLORIDE 0.9 % IV SOLN: INTRAVENOUS | Status: DC

## 2014-06-21 MED ORDER — LACTATED RINGERS IV SOLN
INTRAVENOUS | Status: DC
Start: 2014-06-21 — End: 2014-06-21
  Administered 2014-06-21: 1000 mL via INTRAVENOUS

## 2014-06-21 SURGICAL SUPPLY — 15 items

## 2014-06-21 NOTE — H&P (View-Only) (Signed)
Review of pertinent gastrointestinal problems: 1. routine risk for colon cancer: no polyps on colonoscopy 05/2006; next colonoscopy 2018  2. Gerd: normal EGD 05/2006; symptoms well controlled on PPI; EGD 10/2010 mild H. Pylori negative gastritis.  EGD 04/2012 Christella HartiganJacobs, done for dysphagia, mildly snug GE junction (no varices), was dilated with CRE balloon. 3. 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 ANA negative, AMA negative, F actin Antibody IgG Highly positive ( F actin antibody Reference Range:This ELISA assay is based on purified F-Actin IgGantibodies. IgG antibodies to F-Actin are presentin approximately: 75% of patients with autoimmune hepatitis type 1, 65% with autoimmune cholangitis, 30% with primary biliary cirrhosis, and 2% of healthy population.) . ferritin normal, hepatitis A, B, C were all negative. Monospot negative, EBV IgG positive. CT Scan showed no biliary dilation, masses. Essentially normal liver. MRCP/MRI October, 2010: cirrhosis noted, micronodular liver, no CBD stones, mild portal hypertensive changes. November, 2010 and liver biopsy showed "Active cirrhosis" I spoke with the pathologist and he said the findings were consistent with autoimmune disease. December, 2010 started on prednisone and azathioprine at 50 mg a day. Total bili decreased from 7.4-1.9 in 4 weeks' time. January 2011 azathioprine, mono therapy, liver tests seemed to increase slightly. February, 2011: liver tests increased while decreasing prednisone and starting to rely on azathioprine therapy alone. She felt poorly. Azathioprine stopped and prednisone resumed at 20 mg a day. Liver tests improved quickly. [facility] visit April, 2011: Dr. Katrinka BlazingSmith agreed with the diagnosis and recommended that we try CellCept 1 g twice a day and taper steroids. May, 2011 she started CellCept 1 g twice daily. October, 2011: liver test elevated again off prednisone, put back on 30 mg  per day prednisone, continue CellCept 2 g per day (Dr. Katrinka BlazingSmith). November 2000 and 11 repeat liver biopsy confirmed active autoimmune hepatitis. January 2012 poorly compliant to [facility] recommendations as well as my own total bilirubin 2.5, AST 900, ALT 600. 02/2012 under care of [facility], most recent lfts show normal INR, normal Plts, AST and ALT 50-60 range.04/2014 she [facility] many [facility], 1-2 years, office visit January 2016 she reestablished care. Her hepatologist [facility]. He recommended she have repeat EGD for variceal screening.   HPI: This is a   very pleasant, historically noncompliant 49 year old woman whom I last saw about a year ago at the time of an upper endoscopy.  [facility]  She is on cell cept, back on prednisone for about 3 weeks. Feeling much better since on prednisone, following up with her labs routinely.    Her [facility] hepatologist asked that I repeat her EGD for variceal screening.  Past Medical History  Diagnosis Date  . Cystic acne   . 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 // AFP 15 (03/2009)  // Was changed from azathioprine bc unable to achieve remission --> now chronically on Cellcept and prednisone since May 2011// Followed by Dr . Christella HartiganJacobs ([facility]), and now with Dr. Larey DresserAlastair Smith ([facility])  . 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 Perimenopausal   . Macrocytosis without anemia  BL MCV 102-111. Unclear cause. Possibly due to hypothyroidism vs liver disease (although no clear history of alcohol abuse)  // RBC folate and B12 are within normal limits  . Tobacco abuse   . HTN (hypertension)   . GERD (gastroesophageal reflux disease)     EGD  (10/2010) - showing mild gastritis, bx negative - by Dr. Christella Hartigan  . Headache(784.0)     tension with features of migraine  . History of rectal bleeding     2/2 hemorrhoids, colonoscopy 2008 --> next due 2018  . Hemorrhoids     Small internal and external hemorrhoids noted on colonoscopy (05/2006) - thought to be the cause of the rectal bleeding that she noted.   . Liver cirrhosis secondary to NASH (nonalcoholic steatohepatitis)   . Migraines   . Cataract   . Osteoarthritis     Past Surgical History  Procedure Laterality Date  . Cholecystectomy  1992    laparoscopically   . Ercp    . Colonoscopy    . Tubal ligation      fibroid tumor removed from left fallopian tube  . Carpal tunnel release      Current Outpatient Prescriptions  Medication Sig Dispense Refill  . CVS VITAMIN D3 1000 UNITS capsule TAKE ONE CAPSULE BY MOUTH EVERY DAY (Patient taking differently: TAKE  CAPSULE BY MOUTH TWICE A  DAY) 100 capsule 2  . KLOR-CON M20 20 MEQ tablet Take 1 tablet by mouth 2 (two) times daily.    Marland Kitchen lisinopril (PRINIVIL,ZESTRIL) 40 MG tablet TAKE 1 TABLET (40 MG TOTAL) BY MOUTH DAILY. 90 tablet 3  . loratadine (CLARITIN) 10 MG tablet Take 1 tablet (10 mg total) by mouth daily. (Patient taking differently: Take 10 mg by mouth as needed. ) 60 tablet 3  . meclizine (ANTIVERT) 12.5 MG tablet Take 1 tablet (12.5 mg total) by mouth 3 (three) times daily as needed for dizziness. 10 tablet 0  . mycophenolate (CELLCEPT) 250 MG capsule Take 4 capsules by mouth 2 (two) times daily.     Marland Kitchen omeprazole (PRILOSEC) 40 MG capsule Take 1 capsule (40 mg total) by mouth 2 (two) times daily. 60 capsule 0  . predniSONE (DELTASONE) 5 MG tablet Take 20 mg by mouth daily with breakfast.    . triamterene-hydrochlorothiazide (MAXZIDE-25) 37.5-25 MG per tablet TAKE 1 TABLET BY MOUTH DAILY. 90 tablet 3   No current facility-administered medications for this visit.    Allergies as of 06/08/2014 - Review Complete 06/08/2014   Allergen Reaction Noted  . Aspirin  09/30/2006  . Latex Rash 09/30/2006    Family History  Problem Relation Age of Onset  . Diabetes Mother   . Migraines Mother   . Cancer Brother   . Colon cancer Neg Hx   . Colon polyps Neg Hx   . Rectal cancer Neg Hx   . Stomach cancer Neg Hx     History   Social History  . Marital Status: Single    Spouse Name: [name]  . Number of Children: 6  . Years of Education: [name]   Occupational History  . Not on file.   Social History Main Topics  . Smoking status: Current Every Day Smoker -- 0.50 packs/day for 3 years    Types: Cigarettes  . Smokeless tobacco: Never Used     Comment: 8-9 cigs/day.  Cutting back.  6-7 cigerettes per day  . Alcohol Use: No  . Drug Use: No  . Sexual Activity: Not on file  Other Topics Concern  . Not on file   Social History Narrative   Last updated: 11/28/2009    Applied for disability but was denied, currently appealing, and currently unemployed   Previously worked at [employer]   Does hair on the side    Lives alone, in a long-term relationship    6 children - living with one child currently, 43 years old, rest are grown   Smokes 4 cig daily, has cut back from 1 PPD   No alcohol or illicit drug use       Physical Exam: BP 110/66 mmHg  Pulse 84  Ht  (1.549 m)  Wt 184 lb 6 oz (83.632 kg)  BMI 34.86 kg/m2 Constitutional: Cushingoid Psychiatric: alert and oriented x3 Abdomen: soft, nontender, nondistended, no obvious ascites, no peritoneal signs, normal bowel sounds     Assessment and plan: 49 y.o. female with cirrhosis, autoimmune hepatitis, historically fairly noncompliant  Her do hepatologist asked that I performed EGD here to screen her for esophageal varices. I'm happy to do that and I will forward the results to them when available. She otherwise gets all of her hepatology, liver care at [facility]. She has been having a lot of blood tests over the past several weeks. I reviewed a note from  her do hepatologist dated about 2 or 3 weeks ago and it seemed that her transaminases had significantly elevated again as she came off of prednisone. She is back on prednisone now under their direction.

## 2014-06-21 NOTE — Interval H&P Note (Signed)
History and Physical Interval Note:  06/21/2014 8:03 AM  Alexandria White  has presented today for surgery, with the diagnosis of anemia   The various methods of treatment have been discussed with the patient and family. After consideration of risks, benefits and other options for treatment, the patient has consented to  Procedure(s): ESOPHAGOGASTRODUODENOSCOPY (EGD) WITH PROPOFOL ([name]) as a surgical intervention .  The patient's history has been reviewed, patient examined, no change in status, stable for surgery.  I have reviewed the patient's chart and labs.  Questions were answered to the patient's satisfaction.     Rachael FeeJacobs, Taitum Menton P

## 2014-06-21 NOTE — Anesthesia Postprocedure Evaluation (Signed)
  Anesthesia Post-op Note  Patient: Alexandria White StableBeatrice A White  Procedure(s) Performed: Procedure(s) (LRB): ESOPHAGOGASTRODUODENOSCOPY (EGD) WITH PROPOFOL ([name])  Patient Location: PACU  Anesthesia Type: MAC  Level of Consciousness: awake and alert   Airway and Oxygen Therapy: Patient Spontanous Breathing  Post-op Pain: mild  Post-op Assessment: Post-op Vital signs reviewed, Patient's Cardiovascular Status Stable, Respiratory Function Stable, Patent Airway and No signs of Nausea or vomiting  Last Vitals:  Filed Vitals:   06/21/14 0843  BP: 131/84  Pulse: 64  Temp:   Resp: 16    Post-op Vital Signs: stable   Complications: No apparent anesthesia complications

## 2014-06-21 NOTE — Transfer of Care (Signed)
Immediate Anesthesia Transfer of Care Note  Patient: Alexandria White  Procedure(s) Performed: Procedure(s) (LRB): ESOPHAGOGASTRODUODENOSCOPY (EGD) WITH PROPOFOL ([name])  Patient Location: PACU  Anesthesia Type: MAC  Level of Consciousness: sedated, patient cooperative and responds to stimulation  Airway & Oxygen Therapy: Patient Spontanous Breathing and Patient connected to face mask oxgen  Post-op Assessment: Report given to PACU RN and Post -op Vital signs reviewed and stable  Post vital signs: Reviewed and stable  Complications: No apparent anesthesia complications

## 2014-06-21 NOTE — Op Note (Signed)
[facility] [address] [place], [id number]   ENDOSCOPY PROCEDURE REPORT  PATIENT: Alexandria White, Alexandria White  MR#: [id number] BIRTHDATE: 1966/03/03 , 48  yrs. old GENDER: female ENDOSCOPIST: Rachael Feeaniel P Kupono Marling, MD PROCEDURE DATE:  06/21/2014 PROCEDURE:  EGD, diagnostic ASA CLASS:     Class IV INDICATIONS:  AIH (under care of [facility], Dr.  Hal HopePearcy), cirrhosis; repeat variceal screening;Marland Kitchen. MEDICATIONS: Monitored anesthesia care TOPICAL ANESTHETIC: none  DESCRIPTION OF PROCEDURE: After the risks benefits and alternatives of the procedure were thoroughly explained, informed consent was obtained.  The PENTAX GASTOROSCOPE [id number] endoscope was introduced through the mouth and advanced to the second portion of the duodenum , Without limitations.  The instrument was slowly withdrawn as the mucosa was fully examined.  There was mild, non-specific pan gastritis.  This did not appear to be portal gastropathy.  There were no gastric or esophageal varices.  There was White moderate amount of retained solid food in the stomach without anatomic outlet obstruction.  The examination was otherwise normal.  Retroflexed views revealed no abnormalities. The scope was then withdrawn from the patient and the procedure completed.  COMPLICATIONS: There were no immediate complications.  ENDOSCOPIC IMPRESSION: There was mild, non-specific pan gastritis.  This did not appear to be portal gastropathy.  There were no gastric or esophageal varices.  There was White moderate amount of retained solid food in the stomach without anatomic outlet obstruction.  The examination was otherwise normal  RECOMMENDATIONS: Follow up with [facility] for your liver care for AIH.  Will plan to repeat variceal screening in 2-3 years.   eSigned:  Rachael Feeaniel P Verneice Caspers, MD 06/21/2014 8:42 AM    CC: [facility]

## 2014-06-21 NOTE — Anesthesia Preprocedure Evaluation (Addendum)
Anesthesia Evaluation  Patient identified by MRN, date of birth, ID band Patient awake    Reviewed: Allergy & Precautions, H&P , NPO status , Patient's Chart, lab work & pertinent test results  History of Anesthesia Complications (+) PONV  Airway Mallampati: II  TM Distance: >3 FB Neck ROM: full    Dental no notable dental hx. (+) Teeth Intact, Dental Advisory Given   Pulmonary shortness of breath and with exertion, Current Smoker,  breath sounds clear to auscultation  Pulmonary exam normal       Cardiovascular Exercise Tolerance: Good hypertension, Pt. on medications negative cardio ROS  Rhythm:regular Rate:Normal     Neuro/Psych negative neurological ROS  negative psych ROS   GI/Hepatic negative GI ROS, GERD-  Medicated and Controlled,(+) Cirrhosis -      , Hepatitis -, Autoimmune  Endo/Other  Hashimoto's thyroiditis  Renal/GU negative Renal ROS  negative genitourinary   Musculoskeletal   Abdominal   Peds  Hematology negative hematology ROS (+)   Anesthesia Other Findings   Reproductive/Obstetrics negative OB ROS                            Anesthesia Physical Anesthesia Plan  ASA: III  Anesthesia Plan: MAC   Post-op Pain Management:    Induction:   Airway Management Planned:   Additional Equipment:   Intra-op Plan:   Post-operative Plan:   Informed Consent: I have reviewed the patients History and Physical, chart, labs and discussed the procedure including the risks, benefits and alternatives for the proposed anesthesia with the patient or authorized representative who has indicated his/her understanding and acceptance.   Dental Advisory Given  Plan Discussed with: CRNA and Surgeon  Anesthesia Plan Comments:         Anesthesia Quick Evaluation

## 2014-06-21 NOTE — Discharge Instructions (Signed)

## 2014-06-22 ENCOUNTER — Encounter (HOSPITAL_COMMUNITY): Payer: Self-pay | Admitting: Gastroenterology

## 2014-06-25 ENCOUNTER — Other Ambulatory Visit: Payer: Self-pay | Admitting: Internal Medicine

## 2014-06-27 NOTE — Telephone Encounter (Signed)
Pharm received

## 2014-07-11 ENCOUNTER — Other Ambulatory Visit: Payer: Self-pay | Admitting: Internal Medicine

## 2014-07-31 ENCOUNTER — Encounter: Payer: Self-pay | Admitting: Internal Medicine

## 2014-07-31 ENCOUNTER — Ambulatory Visit (INDEPENDENT_AMBULATORY_CARE_PROVIDER_SITE_OTHER): Payer: Medicaid Other | Admitting: Internal Medicine

## 2014-07-31 VITALS — BP 107/66 | HR 84 | Temp 97.5°F | Ht 61.0 in | Wt 188.2 lb

## 2014-07-31 DIAGNOSIS — Z23 Encounter for immunization: Secondary | ICD-10-CM | POA: Diagnosis not present

## 2014-07-31 DIAGNOSIS — I10 Essential (primary) hypertension: Secondary | ICD-10-CM

## 2014-07-31 DIAGNOSIS — K754 Autoimmune hepatitis: Secondary | ICD-10-CM | POA: Diagnosis not present

## 2014-07-31 DIAGNOSIS — Z0000 Encounter for general adult medical examination without abnormal findings: Secondary | ICD-10-CM | POA: Diagnosis present

## 2014-07-31 MED ORDER — LORATADINE 10 MG PO TABS: 10.0000 mg | ORAL_TABLET | Freq: Every day | ORAL | Status: DC

## 2014-07-31 NOTE — Patient Instructions (Signed)
We will let you know when you when you can get your mammogram done.  You will also get your third injection for hepatitis B today.  Also since you are on prednisone, you are at risk of having high blood sugars and diabetes.   It is important you quit smoking, this is because, with the prednisone you are on you ar at an increased risk of fracture and thinning of your bones, smoking just increases this risk. We advise that you take calcium and Vit D suplimentation to reduce this risk while you are ion prednisone.

## 2014-07-31 NOTE — Progress Notes (Signed)
Patient ID: Alexandria White A Strother, female   DOB: Nov 11, 1965, 49 y.o.   MRN: [id number]   Subjective:   Patient ID: Alexandria White A Hainer female   DOB: Nov 11, 1965 48 y.o.   MRN: [id number]  HPI: [name] is a 49 y.o. with PMH listed below, presented today for follow up of her chronic medical conditions, including autoimmune hepatitis and HTN.   Past Medical History  Diagnosis Date  . Cystic acne   . 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 // AFP 15 (03/2009)  // Was changed from azathioprine bc unable to achieve remission --> now chronically on Cellcept and prednisone since May 2011// Followed by Dr . Christella HartiganJacobs ([facility]), and now with Dr. Larey DresserAlastair Smith ([facility])  . 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 Perimenopausal   . Macrocytosis without anemia     BL MCV 102-111. Unclear cause. Possibly due to hypothyroidism vs liver disease (although no clear history of alcohol abuse)  // RBC folate and B12 are within normal limits  . Tobacco abuse   . HTN (hypertension)   . GERD (gastroesophageal reflux disease)     EGD (10/2010) - showing mild gastritis, bx negative - by Dr. Christella HartiganJacobs  . Headache(784.0)     tension with features of migraine  . History of rectal bleeding     2/2 hemorrhoids, colonoscopy 2008 --> next due 2018  . Hemorrhoids     Small internal and external hemorrhoids noted on colonoscopy (05/2006) - thought to be the cause of the rectal bleeding that she noted.   . Liver cirrhosis secondary to NASH (nonalcoholic steatohepatitis)   . Migraines   . Cataract     bilaterally  . Osteoarthritis   . Shortness of breath dyspnea     occ. with exertion"fat arount the heart"   Current Outpatient Prescriptions  Medication Sig Dispense Refill  . CVS VITAMIN D3 1000 UNITS  capsule TAKE ONE CAPSULE BY MOUTH EVERY DAY 100 capsule 2  . KLOR-CON M20 20 MEQ tablet Take 1 tablet by mouth 2 (two) times daily.    Marland Kitchen. lisinopril (PRINIVIL,ZESTRIL) 40 MG tablet TAKE 1 TABLET (40 MG TOTAL) BY MOUTH DAILY. 90 tablet 3  . loratadine (CLARITIN) 10 MG tablet Take 1 tablet (10 mg total) by mouth daily. 60 tablet 3  . mycophenolate (CELLCEPT) 250 MG capsule Take 4 capsules by mouth 2 (two) times daily.     Marland Kitchen. omeprazole (PRILOSEC) 40 MG capsule TAKE ONE CAPSULE BY MOUTH TWICE A DAY 60 capsule 0  . predniSONE (DELTASONE) 20 MG tablet Take 20 mg by mouth daily with breakfast.    . triamterene-hydrochlorothiazide (MAXZIDE-25) 37.5-25 MG per tablet TAKE 1 TABLET BY MOUTH DAILY. 90 tablet 3   No current facility-administered medications for this visit.   Family History  Problem Relation Age of Onset  . Diabetes Mother   . Migraines Mother   . Cancer Brother   . Colon cancer Neg Hx   . Colon polyps Neg Hx   . Rectal cancer Neg Hx   . Stomach cancer Neg Hx    History   Social History  . Marital Status: Single    Spouse Name: Alexandria White  . Number of Children: 6  . Years of Education:  Alexandria White   Social History Main Topics  . Smoking status: Current Every Day Smoker -- 0.50 packs/day for 3 years    Types: Cigarettes  . Smokeless tobacco: Never Used     Comment: 3-4  cigs/day.  Cutting back.  6-7 cigerettes per day(1 p per 2 weeks)  . Alcohol Use: No  . Drug Use: No  . Sexual Activity: Not on file   Other Topics Concern  . 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 SKIN- No Rash, or itching. HEAD- No Headache or dizziness. RESPIRATORY- No Cough or SOB. CARDIAC- No chest pain. GI- No vomiting, diarrhoea, abd pain. URINARY- No Frequency, urgency, straining or dysuria. NEUROLOGIC- No Numbness or burning.  Objective:  Physical Exam: Filed Vitals:   07/31/14 1510  BP: 107/66  Pulse: 84  Temp: 97.5 F (36.4 C)  TempSrc: Oral  Height:  (1.549 m)  Weight: 188 lb 3.2 oz (85.367 kg)  SpO2: 100%   GENERAL- alert, co-operative, appears as stated age, not in any distress. HEENT- Atraumatic, normocephalic, neck supple. CARDIAC- RRR, no murmurs, rubs or gallops. RESP- Moving equal volumes of air, no wheezes or crackles. ABDOMEN- Soft, nontender, bowel sounds present. NEURO- No obvious Cr N abnormality, Gait- Normal. EXTREMITIES- pulse 2+, symmetric, no pedal edema. SKIN- Warm, dry, No rash or lesion.  Assessment & Plan:  The patient's case and plan of care was discussed with attending physician, Dr. Criselda Peaches.  Please see problem based charting for assessment and plan.

## 2014-08-01 NOTE — Assessment & Plan Note (Signed)
[facility], details on care everwhere. Per notes attempts to wean patient off Prednsione has resulted in a flare in her autoimmune hepatitis. She previously failed azathioprine therapy and is now on 20mg  of prednisone and Mycophenolate, for which she says he has been complaint with. She feels really great today. She has lost some weight despite the prednisone. Concerns for use of chronic steroid therapy, as it appears patent will be on this indefinitely. She is on PPI prophylaxis. She is also on Vit D supplimentaion, but concerns for osteoporosis considering she is a current tobacco user.  Plan- Will schedule Dexa scan. - Also to complete 3rd dose of hepatitis series.- Cont Vit D supplimentation for now. - HgBa1c while on steroids.

## 2014-08-01 NOTE — Assessment & Plan Note (Signed)
Mamogram today scheduled. Hepatitis B third IM of the series today. HGBA1c

## 2014-08-01 NOTE — Progress Notes (Signed)
Internal Medicine Clinic Attending  Case discussed with Dr. Emokpae soon after the resident saw the patient.  We reviewed the resident's history and exam and pertinent patient test results.  I agree with the assessment, diagnosis, and plan of care documented in the resident's note. 

## 2014-08-01 NOTE — Assessment & Plan Note (Signed)
BP Readings from Last 3 Encounters:  07/31/14 107/66  06/21/14 131/84  06/08/14 110/66    Lab Results  Component Value Date   NA 135 05/10/2014   K 3.2* 05/10/2014   CREATININE 0.84 05/10/2014    Assessment: Blood pressure control:  Controlled, slightly on the soft side but she denies dizziness.  Progress toward BP goal:   At goal Comments:Triamterene-HCTZ- 37.5-25mg  daily and lisinopril- 40mg  daily. Also on daily K suppliments, low K likely form HCTZ, as looking back at chart documentation, this corrected once patient stopped taking HCTZ at a time, LAst K, per care everywhere at [facility]- 3.6.    Plan: Medications:  continue current medications Educational resources provided: handout Self management tools provided:   Other plans:

## 2014-08-03 ENCOUNTER — Other Ambulatory Visit: Payer: Self-pay | Admitting: Internal Medicine

## 2014-08-06 ENCOUNTER — Telehealth: Payer: Self-pay | Admitting: Internal Medicine

## 2014-08-06 NOTE — Telephone Encounter (Signed)
Will reduce dose of Omeprazole to 20mg  daily considering EGD findings of non-specific pan gastritis, without ulcers or portal gastropathy from cirrhosis. Pt is on chronic steroids and has GERD, will therefore just continue PPI at lower dose.

## 2014-08-06 NOTE — Telephone Encounter (Signed)
Called patient to inform her we will be reducing the dose of her Omprazole to 20mg  daily from 40mg  BID. She says this was tried about 2 years ago, and then she startd having very bad reflux that was only relieved with the significantly higher dose of 40mg  BID, this ose was started by her hepatologist. Patient has agreed to try the 20mg  daily dose for now,  For a month, and if symptoms return she can go back to a higher dose- 40mg  daily.  For her Dexa scan, she did have one done in 2012, and showed a score of -0.2, T spine, considered normal. No recommendation on when to repeat, also no recommendaton by Up to date, how frequently a patient on steroids at her age, should have this test repeated. This should be addressed at her next routine visit. Patient cautioned about ongoing tobacco abuse, for now she has declined help- continue to address at every office visit.  [name].

## 2014-08-20 ENCOUNTER — Other Ambulatory Visit: Payer: Self-pay | Admitting: Internal Medicine

## 2014-08-20 ENCOUNTER — Ambulatory Visit (HOSPITAL_COMMUNITY)
Admission: RE | Admit: 2014-08-20 | Discharge: 2014-08-20 | Disposition: A | Discharge status: Home / Self Care | Payer: Medicaid Other | Source: Ambulatory Visit | Attending: Internal Medicine | Admitting: Internal Medicine

## 2014-08-20 DIAGNOSIS — Z1231 Encounter for screening mammogram for malignant neoplasm of breast: Secondary | ICD-10-CM | POA: Diagnosis not present

## 2014-08-20 DIAGNOSIS — Z0000 Encounter for general adult medical examination without abnormal findings: Secondary | ICD-10-CM

## 2014-08-23 ENCOUNTER — Telehealth: Payer: Self-pay | Admitting: Internal Medicine

## 2014-08-23 NOTE — Telephone Encounter (Signed)
Call to patient to confirm appointment for 08/24/14 at 9:45 lmtcb ° °

## 2014-08-24 ENCOUNTER — Ambulatory Visit: Payer: Medicaid Other | Admitting: Internal Medicine

## 2014-08-24 ENCOUNTER — Encounter: Payer: Self-pay | Admitting: Internal Medicine

## 2014-10-09 ENCOUNTER — Other Ambulatory Visit: Payer: Self-pay | Admitting: Internal Medicine

## 2014-12-13 ENCOUNTER — Encounter (HOSPITAL_COMMUNITY): Payer: Self-pay | Admitting: Cardiology

## 2014-12-13 ENCOUNTER — Emergency Department (HOSPITAL_COMMUNITY)
Admission: EM | Admit: 2014-12-13 | Discharge: 2014-12-13 | Disposition: A | Discharge status: Home / Self Care | Payer: Medicaid Other | Attending: Emergency Medicine | Admitting: Emergency Medicine

## 2014-12-13 ENCOUNTER — Emergency Department (HOSPITAL_COMMUNITY): Payer: Medicaid Other

## 2014-12-13 DIAGNOSIS — R1011 Right upper quadrant pain: Secondary | ICD-10-CM | POA: Insufficient documentation

## 2014-12-13 DIAGNOSIS — Z7952 Long term (current) use of systemic steroids: Secondary | ICD-10-CM | POA: Insufficient documentation

## 2014-12-13 DIAGNOSIS — Z872 Personal history of diseases of the skin and subcutaneous tissue: Secondary | ICD-10-CM | POA: Diagnosis not present

## 2014-12-13 DIAGNOSIS — R110 Nausea: Secondary | ICD-10-CM | POA: Insufficient documentation

## 2014-12-13 DIAGNOSIS — K219 Gastro-esophageal reflux disease without esophagitis: Secondary | ICD-10-CM | POA: Insufficient documentation

## 2014-12-13 DIAGNOSIS — E871 Hypo-osmolality and hyponatremia: Secondary | ICD-10-CM | POA: Insufficient documentation

## 2014-12-13 DIAGNOSIS — J0181 Other acute recurrent sinusitis: Secondary | ICD-10-CM

## 2014-12-13 DIAGNOSIS — H538 Other visual disturbances: Secondary | ICD-10-CM | POA: Insufficient documentation

## 2014-12-13 DIAGNOSIS — E878 Other disorders of electrolyte and fluid balance, not elsewhere classified: Secondary | ICD-10-CM | POA: Insufficient documentation

## 2014-12-13 DIAGNOSIS — I10 Essential (primary) hypertension: Secondary | ICD-10-CM | POA: Insufficient documentation

## 2014-12-13 DIAGNOSIS — R200 Anesthesia of skin: Secondary | ICD-10-CM | POA: Insufficient documentation

## 2014-12-13 DIAGNOSIS — E860 Dehydration: Secondary | ICD-10-CM | POA: Diagnosis not present

## 2014-12-13 DIAGNOSIS — Z79899 Other long term (current) drug therapy: Secondary | ICD-10-CM | POA: Diagnosis not present

## 2014-12-13 DIAGNOSIS — R51 Headache: Secondary | ICD-10-CM | POA: Diagnosis present

## 2014-12-13 DIAGNOSIS — Z720 Tobacco use: Secondary | ICD-10-CM | POA: Insufficient documentation

## 2014-12-13 DIAGNOSIS — Z91040 Latex allergy status: Secondary | ICD-10-CM | POA: Diagnosis not present

## 2014-12-13 DIAGNOSIS — Z862 Personal history of diseases of the blood and blood-forming organs and certain disorders involving the immune mechanism: Secondary | ICD-10-CM | POA: Insufficient documentation

## 2014-12-13 DIAGNOSIS — R509 Fever, unspecified: Secondary | ICD-10-CM | POA: Insufficient documentation

## 2014-12-13 DIAGNOSIS — M1990 Unspecified osteoarthritis, unspecified site: Secondary | ICD-10-CM | POA: Diagnosis not present

## 2014-12-13 DIAGNOSIS — G8929 Other chronic pain: Secondary | ICD-10-CM | POA: Insufficient documentation

## 2014-12-13 LAB — CBC
HCT: 38.8 % (ref 36.0–46.0)
Hemoglobin: 13.3 g/dL (ref 12.0–15.0)
MCH: 33 pg (ref 26.0–34.0)
MCHC: 34.3 g/dL (ref 30.0–36.0)
MCV: 96.3 fL (ref 78.0–100.0)
Platelets: 136 10*3/uL — ABNORMAL LOW (ref 150–400)
RBC: 4.03 MIL/uL (ref 3.87–5.11)
RDW: 14.6 % (ref 11.5–15.5)
WBC: 5.3 10*3/uL (ref 4.0–10.5)

## 2014-12-13 LAB — COMPREHENSIVE METABOLIC PANEL
ALT: 58 U/L — ABNORMAL HIGH (ref 14–54)
AST: 114 U/L — ABNORMAL HIGH (ref 15–41)
Albumin: 3.4 g/dL — ABNORMAL LOW (ref 3.5–5.0)
Alkaline Phosphatase: 88 U/L (ref 38–126)
Anion gap: 13 (ref 5–15)
BUN: 12 mg/dL (ref 6–20)
CO2: 22 mmol/L (ref 22–32)
Calcium: 8.7 mg/dL — ABNORMAL LOW (ref 8.9–10.3)
Chloride: 97 mmol/L — ABNORMAL LOW (ref 101–111)
Creatinine, Ser: 1.08 mg/dL — ABNORMAL HIGH (ref 0.44–1.00)
GFR calc Af Amer: >60 mL/min (ref >60)
GFR calc non Af Amer: 59 mL/min — ABNORMAL LOW (ref >60)
Glucose, Bld: 93 mg/dL (ref 65–99)
Potassium: 4.2 mmol/L (ref 3.5–5.1)
Sodium: 132 mmol/L — ABNORMAL LOW (ref 135–145)
Total Bilirubin: 2 mg/dL — ABNORMAL HIGH (ref 0.3–1.2)
Total Protein: 6.3 g/dL — ABNORMAL LOW (ref 6.5–8.1)

## 2014-12-13 MED ORDER — SULFAMETHOXAZOLE-TRIMETHOPRIM 800-160 MG PO TABS
1.0000 | ORAL_TABLET | Freq: Once | ORAL | Status: AC
  Administered 2014-12-13: 1 via ORAL
  Filled 2014-12-13: qty 1

## 2014-12-13 MED ORDER — AMOXICILLIN-POT CLAVULANATE 875-125 MG PO TABS
1.0000 | ORAL_TABLET | Freq: Two times a day (BID) | ORAL | Status: DC
Start: 2014-12-13 — End: 2015-01-10

## 2014-12-13 MED ORDER — IBUPROFEN 200 MG PO TABS
400.0000 mg | ORAL_TABLET | ORAL | Status: DC | PRN
  Administered 2014-12-13: 400 mg via ORAL
  Filled 2014-12-13: qty 2

## 2014-12-13 MED ORDER — SODIUM CHLORIDE 0.9 % IV BOLUS (SEPSIS)
1000.0000 mL | Freq: Once | INTRAVENOUS | Status: AC
  Administered 2014-12-13: 1000 mL via INTRAVENOUS

## 2014-12-13 MED ORDER — OXYCODONE-ACETAMINOPHEN 5-325 MG PO TABS: 1.0000 | ORAL_TABLET | Freq: Three times a day (TID) | ORAL | Status: DC | PRN

## 2014-12-13 MED ORDER — PROCHLORPERAZINE EDISYLATE 5 MG/ML IJ SOLN
10.0000 mg | Freq: Once | INTRAMUSCULAR | Status: AC
  Administered 2014-12-13: 10 mg via INTRAVENOUS
  Filled 2014-12-13: qty 2

## 2014-12-13 MED ORDER — KETOROLAC TROMETHAMINE 30 MG/ML IJ SOLN
30.0000 mg | Freq: Once | INTRAMUSCULAR | Status: AC
  Administered 2014-12-13: 30 mg via INTRAVENOUS
  Filled 2014-12-13: qty 1

## 2014-12-13 MED ORDER — DIPHENHYDRAMINE HCL 50 MG/ML IJ SOLN
25.0000 mg | Freq: Once | INTRAMUSCULAR | Status: AC
  Administered 2014-12-13: 25 mg via INTRAVENOUS
  Filled 2014-12-13: qty 1

## 2014-12-13 NOTE — ED Provider Notes (Signed)
CSN: [id number]     Arrival date & time 12/13/14  1058 History   First MD Initiated Contact with Patient 12/13/14 1137     Chief Complaint  Patient presents with  . Headache  . Nausea   HPI  Alexandria White is a 49yo female presenting today for sinus headache and nausea. States headache is located bilaterally in frontal and maxillary sinuses and has been constant for two weeks. Headache is exacerbated by lights and sounds. Also notes nausea, but has not vomited. Started having fevers yesterday. Also reports blurred vision today, bilateral. Notes numbness of right leg on Tuesday, but this has resolved. States she has had sinus headaches in the past, but none of them have ever lasted this long. Has tried Ibuprofen without relief.  Past Medical History  Diagnosis Date  . Cystic acne   . 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 // AFP 15 (03/2009)  // Was changed from azathioprine bc unable to achieve remission --> now chronically on Cellcept and prednisone since May 2011// Followed by Dr . Christella Hartigan ([facility]), and now with Dr. Larey Dresser ([facility])  . 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 Perimenopausal   . Macrocytosis without anemia     BL MCV 102-111. Unclear cause. Possibly due to hypothyroidism vs liver disease (although no clear history of alcohol abuse)  // RBC folate and B12 are within normal limits  . Tobacco abuse   . HTN (hypertension)   . GERD (gastroesophageal reflux disease)     EGD (10/2010) - showing mild gastritis, bx negative - by Dr. Christella Hartigan  . Headache(784.0)     tension with features of migraine  . History of rectal bleeding     2/2 hemorrhoids, colonoscopy 2008 --> next due 2018  . Hemorrhoids     Small internal and external hemorrhoids noted on colonoscopy  (05/2006) - thought to be the cause of the rectal bleeding that she noted.   . Liver cirrhosis secondary to NASH (nonalcoholic steatohepatitis)   . Migraines   . Cataract     bilaterally  . Osteoarthritis   . Shortness of breath dyspnea     occ. with exertion"fat arount the heart"   Past Surgical History  Procedure Laterality Date  . Cholecystectomy  1992    laparoscopically   . Ercp    . Colonoscopy    . Tubal ligation      fibroid tumor removed from left fallopian tube  . Carpal tunnel release    . Dilation and curettage of uterus    . Esophagogastroduodenoscopy (egd) with propofol [name] 06/21/2014    Procedure: ESOPHAGOGASTRODUODENOSCOPY (EGD) WITH PROPOFOL;  Surgeon: Rachael Fee, MD;  Location: [facility];  Service: Endoscopy;  Laterality: [name];   Family History  Problem Relation Age of Onset  . Diabetes Mother   . Migraines Mother   . Cancer Brother   . Colon cancer Neg Hx   . Colon polyps Neg Hx   . Rectal cancer Neg Hx   . Stomach cancer Neg Hx    Social History  Substance Use Topics  . Smoking status: Current Every Day Smoker -- 0.50 packs/day for 3 years    Types: Cigarettes  . Smokeless tobacco: Never Used  Comment: 3-4  cigs/day.  Cutting back.  6-7 cigerettes per day(1 p per 2 weeks)  . Alcohol Use: No   OB History    No data available     Review of Systems  Constitutional: Positive for fever.  Eyes: Positive for visual disturbance.  Neurological: Positive for numbness and headaches.      Allergies  Aspirin and Latex  Home Medications   Prior to Admission medications   Medication Sig Start Date End Date Taking? Authorizing Provider  [employer] D3 1000 UNITS capsule TAKE ONE CAPSULE BY MOUTH EVERY DAY 08/06/14  Yes Ejiroghene E Emokpae, MD  ibuprofen (ADVIL,MOTRIN) 200 MG tablet Take 200 mg by mouth every 6 (six) hours as needed for mild pain.   Yes Historical Provider, MD  KLOR-CON M20 20 MEQ tablet Take 1 tablet by mouth 2 (two) times daily.  05/22/14  Yes Historical Provider, MD  lisinopril (PRINIVIL,ZESTRIL) 40 MG tablet TAKE 1 TABLET (40 MG TOTAL) BY MOUTH DAILY. 10/09/14  Yes Ejiroghene E Emokpae, MD  loratadine (CLARITIN) 10 MG tablet Take 1 tablet (10 mg total) by mouth daily. 07/31/14  Yes Ejiroghene Wendall Stade, MD  mycophenolate (CELLCEPT) 250 MG capsule Take 4 capsules by mouth 2 (two) times daily.  04/29/10  Yes Historical Provider, MD  omeprazole (PRILOSEC) 20 MG capsule Take 1 capsule (20 mg total) by mouth daily. 08/06/14  Yes Ejiroghene E Emokpae, MD  predniSONE (DELTASONE) 10 MG tablet Take 10 mg by mouth daily with breakfast.   Yes Historical Provider, MD  triamterene-hydrochlorothiazide (MAXZIDE-25) 37.5-25 MG per tablet TAKE 1 TABLET BY MOUTH DAILY. 10/09/14  Yes Ejiroghene Wendall Stade, MD  amoxicillin-clavulanate (AUGMENTIN) 875-125 MG per tablet Take 1 tablet by mouth 2 (two) times daily. 12/13/14   [place] N Caylin Raby, DO  oxyCODONE-acetaminophen (PERCOCET/ROXICET) 5-325 MG per tablet Take 1 tablet by mouth every 8 (eight) hours as needed for severe pain. 12/13/14   [place] N Kanyia Heaslip, DO   BP 129/68 mmHg  Pulse 98  Temp(Src) 101.3 F (38.5 C) (Oral)  Resp 16  Ht 5' 1.5" (1.562 m)  Wt 188 lb (85.276 kg)  BMI 34.95 kg/m2  SpO2 93% Physical Exam  Constitutional: She is oriented to person, place, and time. She appears well-developed and well-nourished. No distress.  Sitting in room with lights off  HENT:  Head: Normocephalic and atraumatic.  Tenderness over maxillary and frontal sinuses bilaterally, frontal sinuses equal to illumination. Dry mucous membranes.  Eyes: EOM are normal. Pupils are equal, round, and reactive to light. Right eye exhibits no discharge. Left eye exhibits no discharge.  Cardiovascular: Normal rate and regular rhythm.  Exam reveals no gallop and no friction rub.   No murmur heard. Pulmonary/Chest: Effort normal. No respiratory distress. She has no wheezes. She has no rales.  Abdominal: Soft. Bowel  sounds are normal. She exhibits no distension.  RUQ tenderness (chronic and unchanged per patient)  Musculoskeletal: She exhibits no edema.  Neurological: She is alert and oriented to person, place, and time. No cranial nerve deficit.  Muscle strength 5/5 in upper and lower extremities. Sensation intact.  Skin: Skin is warm and dry. No rash noted.  Psychiatric: She has a normal mood and affect. Her behavior is normal.    ED Course  Procedures (including critical care time) Labs Review Labs Reviewed  CBC - Abnormal; Notable for the following:    Platelets 136 (*)    All other components within normal limits  COMPREHENSIVE METABOLIC PANEL - Abnormal; Notable for the  following:    Sodium 132 (*)    Chloride 97 (*)    Creatinine, Ser 1.08 (*)    Calcium 8.7 (*)    Total Protein 6.3 (*)    Albumin 3.4 (*)    AST 114 (*)    ALT 58 (*)    Total Bilirubin 2.0 (*)    GFR calc non Af Amer 59 (*)    All other components within normal limits    Imaging Review Ct Head Wo Contrast  12/13/2014   CLINICAL DATA:  Frontal headache, dizziness and photosensitivity since Sunday.  EXAM: CT HEAD WITHOUT CONTRAST  TECHNIQUE: Contiguous axial images were obtained from the base of the skull through the vertex without intravenous contrast.  COMPARISON:  12/17/2010  FINDINGS: The ventricles are normal in size and configuration. No extra-axial fluid collections are identified. The gray-white differentiation is normal. No CT findings for acute intracranial process such as hemorrhage or infarction. No mass lesions. The brainstem and cerebellum are grossly normal.  The bony structures are intact. The paranasal sinuses and mastoid air cells are clear. The globes are intact.  IMPRESSION: Normal head CT   Electronically Signed   By: Rudie Meyer M.D.   On: 12/13/2014 12:51   I have personally reviewed and evaluated these images and lab results as part of my medical decision-making.   EKG Interpretation None       MDM   Final diagnoses:  Other acute recurrent sinusitis  CBC with normal WBC count (5.3) and mild thrombocytopenia (136). BMP with mild hyponatremia (132), mild hypochloremia (97), mild kidney injury with creatinine 1.08 (up from 0.84 seven months ago), and elevated liver enzymes (AST 114, ALT 58) much improved from seven months ago. CT head normal.  Suspect sinusitis is contributing to headaches given fever. Will initiate course of augmentin. Slight dehydration on exam with elevated creatine, so 1L NS bolus given. Stable for discharge with continuation of augmentin. Follow up at PCP to recheck creatinine. Prescription for percocet given for pain.    Lora Havens [place], [place] 12/13/14 1434  Raeford Razor, MD 12/14/14 [phone]

## 2014-12-13 NOTE — ED Notes (Signed)
Pt reports a sinus headache and nausea for the past couple of days. Reports that she has also felt weak all over.

## 2014-12-13 NOTE — ED Notes (Signed)
Patient transported to CT 

## 2014-12-13 NOTE — Discharge Instructions (Signed)
Please complete a 7 day course of your antibiotics. I have also given you a prescription of pain medicine for you to take as needed until the antibiotics start to offer relief. Your labs showed some dehydration, so I gave you a bolus of fluid here to "fill up your tank." Please follow up with your general doctor show he can recheck your labs. If not starting to improve within 1-2 weeks, please go to your doctor for further evaluation.  Sinusitis Sinusitis is redness, soreness, and inflammation of the paranasal sinuses. Paranasal sinuses are air pockets within the bones of your face (beneath the eyes, the middle of the forehead, or above the eyes). In healthy paranasal sinuses, mucus is able to drain out, and air is able to circulate through them by way of your nose. However, when your paranasal sinuses are inflamed, mucus and air can become trapped. This can allow bacteria and other germs to grow and cause infection. Sinusitis can develop quickly and last only a short time (acute) or continue over a long period (chronic). Sinusitis that lasts for more than 12 weeks is considered chronic.  CAUSES  Causes of sinusitis include:  Allergies.  Structural abnormalities, such as displacement of the cartilage that separates your nostrils (deviated septum), which can decrease the air flow through your nose and sinuses and affect sinus drainage.  Functional abnormalities, such as when the small hairs (cilia) that line your sinuses and help remove mucus do not work properly or are not present. SIGNS AND SYMPTOMS  Symptoms of acute and chronic sinusitis are the same. The primary symptoms are pain and pressure around the affected sinuses. Other symptoms include:  Upper toothache.  Earache.  Headache.  Bad breath.  Decreased sense of smell and taste.  A cough, which worsens when you are lying flat.  Fatigue.  Fever.  Thick drainage from your nose, which often is green and may contain pus  (purulent).  Swelling and warmth over the affected sinuses. DIAGNOSIS  Your health care provider will perform a physical exam. During the exam, your health care provider may:  Look in your nose for signs of abnormal growths in your nostrils (nasal polyps).  Tap over the affected sinus to check for signs of infection.  View the inside of your sinuses (endoscopy) using an imaging device that has a light attached (endoscope). If your health care provider suspects that you have chronic sinusitis, one or more of the following tests may be recommended:  Allergy tests.  Nasal culture. A sample of mucus is taken from your nose, sent to a lab, and screened for bacteria.  Nasal cytology. A sample of mucus is taken from your nose and examined by your health care provider to determine if your sinusitis is related to an allergy. TREATMENT  Most cases of acute sinusitis are related to a viral infection and will resolve on their own within 10 days. Sometimes medicines are prescribed to help relieve symptoms (pain medicine, decongestants, nasal steroid sprays, or saline sprays).  However, for sinusitis related to a bacterial infection, your health care provider will prescribe antibiotic medicines. These are medicines that will help kill the bacteria causing the infection.  Rarely, sinusitis is caused by a fungal infection. In theses cases, your health care provider will prescribe antifungal medicine. For some cases of chronic sinusitis, surgery is needed. Generally, these are cases in which sinusitis recurs more than 3 times per year, despite other treatments. HOME CARE INSTRUCTIONS   Drink plenty of water. Water  helps thin the mucus so your sinuses can drain more easily.  Use a humidifier.  Inhale steam 3 to 4 times a day (for example, sit in the bathroom with the shower running).  Apply a warm, moist washcloth to your face 3 to 4 times a day, or as directed by your health care provider.  Use  saline nasal sprays to help moisten and clean your sinuses.  Take medicines only as directed by your health care provider.  If you were prescribed either an antibiotic or antifungal medicine, finish it all even if you start to feel better. SEEK IMMEDIATE MEDICAL CARE IF:  You have increasing pain or severe headaches.  You have nausea, vomiting, or drowsiness.  You have swelling around your face.  You have vision problems.  You have a stiff neck.  You have difficulty breathing. MAKE SURE YOU:   Understand these instructions.  Will watch your condition.  Will get help right away if you are not doing well or get worse. Document Released: 04/13/2005 Document Revised: 08/28/2013 Document Reviewed: 04/28/2011 [facility] Patient Information 2015 [place], [place]. This information is not intended to replace advice given to you by your health care provider. Make sure you discuss any questions you have with your health care provider.

## 2014-12-19 ENCOUNTER — Ambulatory Visit (INDEPENDENT_AMBULATORY_CARE_PROVIDER_SITE_OTHER): Payer: Medicaid Other | Admitting: Internal Medicine

## 2014-12-19 VITALS — BP 98/68 | HR 106 | Temp 98.9°F | Wt 188.4 lb

## 2014-12-19 DIAGNOSIS — K754 Autoimmune hepatitis: Secondary | ICD-10-CM | POA: Diagnosis not present

## 2014-12-19 DIAGNOSIS — I10 Essential (primary) hypertension: Secondary | ICD-10-CM | POA: Diagnosis not present

## 2014-12-19 DIAGNOSIS — E860 Dehydration: Secondary | ICD-10-CM

## 2014-12-19 NOTE — Patient Instructions (Signed)
Alexandria White it was nice meeting you today. I have ordered some labs for you today and you will be informed when the results come back. Please return for a follow up visit with your PCP on 01/08/15.  STOP taking: Triamterene-Hydrochlorothiazide 37.5-25 one tablet by mouth daily

## 2014-12-20 DIAGNOSIS — E860 Dehydration: Secondary | ICD-10-CM | POA: Insufficient documentation

## 2014-12-20 LAB — BASIC METABOLIC PANEL
BUN/Creatinine Ratio: 11 (ref 9–23)
BUN: 17 mg/dL (ref 6–24)
CALCIUM: 9 mg/dL (ref 8.7–10.2)
CHLORIDE: 92 mmol/L — AB (ref 97–108)
CO2: 22 mmol/L (ref 18–29)
Creatinine, Ser: 1.5 mg/dL — ABNORMAL HIGH (ref 0.57–1.00)
GFR calc non Af Amer: 41 mL/min/{1.73_m2} — ABNORMAL LOW (ref >59)
GFR, EST AFRICAN AMERICAN: 47 mL/min/{1.73_m2} — AB (ref >59)
Glucose: 87 mg/dL (ref 65–99)
Potassium: 3.6 mmol/L (ref 3.5–5.2)
Sodium: 130 mmol/L — ABNORMAL LOW (ref 134–144)

## 2014-12-20 NOTE — Assessment & Plan Note (Signed)
Pts current BP is 93/49 and orthostatic vitals were normal.  -Discontinued Triamterene-HCTZ because pts SCr was 1.5 and Na 130 indicating dehydration likely 2/2 diuretic use. -cont Lisinopril -repeat BMP at next visit

## 2014-12-20 NOTE — Assessment & Plan Note (Addendum)
Patient had mild RUQ tenderness on palpation and stated she has always had it. LFTs improved this month compared to 04/2014. States she follows up with a hepatologist every 2-3 months.

## 2014-12-20 NOTE — Assessment & Plan Note (Addendum)
Pts SCr on 12/13/14 was 1.8 and from this visit is 1.5. This could likely be due to dehydration from recent illness and diuretic use. Labs also showed a Na of 130 which is likely secondary to diuretic use.  -repeat BMP at next visit  -order UA at next visit to look at urine sediments which could indicate renal pathology  -Diuretic Triameterene-HCTZ discontinued at this visit -F/u appt on 01/08/15

## 2014-12-20 NOTE — Progress Notes (Signed)
Patient ID: Alexandria White, female   DOB: May 04, 1965, 49 y.o.   MRN: [id number]   Subjective:   Patient ID: Alexandria White female   DOB: Jan 03, 1966 49 y.o.   MRN: [id number]  HPI: [name] is a 49 y.o. F with a  PMHx listed below here for an ED follow up. She was recently in the ED for headaches, fever and diagnosed with sinusitis. She was prescribed Augmentin and Percocet. It was noted at the time that the pts SCr was slightly elevated 1.8 and she was asked to follow up with [facility] for repeat BMP. Patient states at present she is not having any fevers, chills, headaches, or sinus pressure. Denies any focal neurological symptoms such as weakness, numbness, or tingling. States she is still taking Augmentin.     Past Medical History  Diagnosis Date  . Cystic acne   . 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 // AFP 15 (03/2009)  // Was changed from azathioprine bc unable to achieve remission --> now chronically on Cellcept and prednisone since May 2011// Followed by Dr . Christella Hartigan ([facility]), and now with Dr. Larey Dresser ([facility])  . 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 Perimenopausal   . Macrocytosis without anemia     BL MCV 102-111. Unclear cause. Possibly due to hypothyroidism vs liver disease (although no clear history of alcohol abuse)  // RBC folate and B12 are within normal limits  . Tobacco abuse   . HTN (hypertension)   . GERD (gastroesophageal reflux disease)     EGD (10/2010) - showing mild gastritis, bx negative - by Dr. Christella Hartigan  . Headache(784.0)     tension with features of migraine  . History of rectal bleeding     2/2 hemorrhoids, colonoscopy 2008 --> next due 2018  . Hemorrhoids     Small internal and external hemorrhoids noted on colonoscopy  (05/2006) - thought to be the cause of the rectal bleeding that she noted.   . Liver cirrhosis secondary to NASH (nonalcoholic steatohepatitis)   . Migraines   . Cataract     bilaterally  . Osteoarthritis   . Shortness of breath dyspnea     occ. with exertion"fat arount the heart"   Current Outpatient Prescriptions  Medication Sig Dispense Refill  . amoxicillin-clavulanate (AUGMENTIN) 875-125 MG per tablet Take 1 tablet by mouth 2 (two) times daily. 21 tablet 0  . [employer]S D3 1000 UNITS capsule TAKE ONE CAPSULE BY MOUTH EVERY DAY 100 capsule 3  . ibuprofen (ADVIL,MOTRIN) 200 MG tablet Take 200 mg by mouth every 6 (six) hours as needed for mild pain.    Marland Kitchen KLOR-CON M20 20 MEQ tablet Take 1 tablet by mouth 2 (two) times daily.    Marland Kitchen lisinopril (PRINIVIL,ZESTRIL) 40 MG tablet TAKE 1 TABLET (40 MG TOTAL) BY MOUTH DAILY. 90 tablet 2  . loratadine (CLARITIN) 10 MG tablet Take 1 tablet (10 mg total) by mouth daily. 60 tablet 3  . mycophenolate (CELLCEPT) 250 MG capsule Take 4 capsules by mouth 2 (two) times daily.     Marland Kitchen omeprazole (PRILOSEC) 20 MG capsule Take 1 capsule (20 mg total) by mouth daily. 30 capsule 6  . oxyCODONE-acetaminophen (PERCOCET/ROXICET) 5-325 MG per tablet Take  1 tablet by mouth every 8 (eight) hours as needed for severe pain. 15 tablet 0  . predniSONE (DELTASONE) 10 MG tablet Take 10 mg by mouth daily with breakfast.     No current facility-administered medications for this visit.   Family History  Problem Relation Age of Onset  . Diabetes Mother   . Migraines Mother   . Cancer Brother   . Colon cancer Neg Hx   . Colon polyps Neg Hx   . Rectal cancer Neg Hx   . Stomach cancer Neg Hx    Social History   Social History  . Marital Status: Single    Spouse Name: [name]  . Number of Children: 6  . Years of Education: [name]   Social History Main Topics  . Smoking status: Current Every Day Smoker -- 0.50 packs/day for 3 years    Types: Cigarettes  . Smokeless tobacco: Never  Used     Comment: 3-4  cigs/day.  Cutting back.  6-7 cigerettes per day(1 p per 2 weeks)  . Alcohol Use: No  . Drug Use: No  . Sexual Activity: Not on file   Other Topics Concern  . Not on file   Social History Narrative   Last updated: 11/28/2009    Applied for disability but was denied, currently appealing, and currently unemployed   Previously worked at [employer]   Does hair on the side    Lives alone, in a long-term relationship    6 children - living with one child currently, 21 years old, rest are grown   Smokes 4 cig daily, has cut back from 1 PPD   No alcohol or illicit drug use    Review of Systems: Review of Systems  Constitutional: Negative for fever and chills.  HENT: Negative for congestion.   Eyes: Negative for blurred vision and pain.  Respiratory: Negative for cough, shortness of breath and wheezing.   Cardiovascular: Negative for chest pain, palpitations and leg swelling.  Gastrointestinal: Negative for nausea, vomiting, abdominal pain, diarrhea and constipation.  Genitourinary: Negative for dysuria, urgency and frequency.  Skin: Negative for itching and rash.  Neurological: Negative for dizziness, tingling, sensory change, focal weakness and headaches.     Objective:  Physical Exam: Filed Vitals:   12/19/14 0902 12/19/14 0946 12/19/14 0947  BP:  Pulse: 109 106 106  Temp: 98.9 F (37.2 C)    TempSrc: Oral    Weight: 188 lb 6.4 oz (85.458 kg)    SpO2: 100%     Physical Exam  Constitutional: She is oriented to person, place, and time. She appears well-developed and well-nourished. No distress.  HENT:  Head: Normocephalic and atraumatic.  Nose: Nose normal.  Mouth/Throat: Oropharynx is clear and moist.  Frontal, ethmoidal, and maxillary sinuses non-tender to palpation.   Eyes: EOM are normal. Pupils are equal, round, and reactive to light.  Neck: Neck supple. No tracheal deviation present.  Cardiovascular: Normal rate, regular  rhythm and intact distal pulses.  Exam reveals no gallop and no friction rub.   No murmur heard. Pulmonary/Chest: Effort normal. No respiratory distress. She has no wheezes. She has no rales.  Abdominal: Soft. Bowel sounds are normal. She exhibits no distension.  RUQ: minimal tenderness on palpation  Musculoskeletal: She exhibits no edema.  Lymphadenopathy:    She has no cervical adenopathy.  Neurological: She is alert and oriented to person, place, and time.  Skin: Skin is warm and dry. No rash noted. No erythema.  Assessment & Plan:

## 2014-12-24 ENCOUNTER — Other Ambulatory Visit: Payer: Self-pay

## 2014-12-24 ENCOUNTER — Telehealth: Payer: Self-pay | Admitting: Gastroenterology

## 2014-12-24 DIAGNOSIS — R1084 Generalized abdominal pain: Secondary | ICD-10-CM

## 2014-12-24 NOTE — Telephone Encounter (Signed)
Pt scheduled for GES at [facility] 01/07/15@7 :30am, Pt to arrive there at 7:15am and be NPO after midnight. Pt aware of appt.

## 2014-12-24 NOTE — Telephone Encounter (Signed)
Pt states she saw Dr. Alfonse Alpers at [facility] and was told she thinks she may have gastroparesis. Pt was seen 10/10/14 and Dr. Alfonse Alpers wanted pt to have a GES. Pt wanted to have the scan done in [place]. Please advise.

## 2014-12-24 NOTE — Telephone Encounter (Signed)
Ok, please order GES for ?gastroparesis.  Thanks

## 2014-12-25 NOTE — Progress Notes (Signed)
I saw and evaluated the patient. I personally confirmed the key portions of Dr. Gardiner Rhyme history and exam and reviewed pertinent patient test results. The assessment, diagnosis, and plan were formulated together and I agree with the documentation in the resident's note.  We will continue to wean off non-essential antihypertensives as hypertension resolves with progression of cirrhosis.  A recent study suggests that a MAP < 82 mmHg has a worse prognosis than a MAP > 82 mmHg.  Thus, if she continues to have lower BPs we will continue to lower the BP meds (next consideration would be lisinopril unless another indication trumps its use as an antihypertensive).

## 2015-01-02 ENCOUNTER — Ambulatory Visit (INDEPENDENT_AMBULATORY_CARE_PROVIDER_SITE_OTHER): Payer: Medicaid Other | Admitting: Internal Medicine

## 2015-01-02 ENCOUNTER — Encounter: Payer: Self-pay | Admitting: Internal Medicine

## 2015-01-02 VITALS — BP 135/90 | HR 89 | Temp 97.8°F | Ht 61.5 in | Wt 194.6 lb

## 2015-01-02 DIAGNOSIS — F17210 Nicotine dependence, cigarettes, uncomplicated: Secondary | ICD-10-CM

## 2015-01-02 DIAGNOSIS — Z79891 Long term (current) use of opiate analgesic: Secondary | ICD-10-CM

## 2015-01-02 DIAGNOSIS — E860 Dehydration: Secondary | ICD-10-CM | POA: Diagnosis not present

## 2015-01-02 DIAGNOSIS — Z23 Encounter for immunization: Secondary | ICD-10-CM | POA: Diagnosis not present

## 2015-01-02 DIAGNOSIS — I10 Essential (primary) hypertension: Secondary | ICD-10-CM | POA: Diagnosis present

## 2015-01-02 NOTE — Progress Notes (Signed)
Patient ID: Alexandria White, female   DOB: 12/31/1965, 49 y.o.   MRN: [id number]   Subjective:   Patient ID: Alexandria White female   DOB: [birth year] 49 y.o.   MRN: [id number]  HPI: [name] is a 49 y.o. with PMH listed below , presented for follow up of her dehydration, hypotension and Acute renal failure.  Past Medical History  Diagnosis Date  . Cystic acne   . 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 // AFP 15 (03/2009)  // Was changed from azathioprine bc unable to achieve remission --> now chronically on Cellcept and prednisone since May 2011// Followed by Dr . Christella Hartigan ([facility]), and now with Dr. Larey Dresser ([facility])  . 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 Perimenopausal   . Macrocytosis without anemia     BL MCV 102-111. Unclear cause. Possibly due to hypothyroidism vs liver disease (although no clear history of alcohol abuse)  // RBC folate and B12 are within normal limits  . Tobacco abuse   . HTN (hypertension)   . GERD (gastroesophageal reflux disease)     EGD (10/2010) - showing mild gastritis, bx negative - by Dr. Christella Hartigan  . Headache(784.0)     tension with features of migraine  . History of rectal bleeding     2/2 hemorrhoids, colonoscopy 2008 --> next due 2018  . Hemorrhoids     Small internal and external hemorrhoids noted on colonoscopy (05/2006) - thought to be the cause of the rectal bleeding that she noted.   . Liver cirrhosis secondary to NASH (nonalcoholic steatohepatitis)   . Migraines   . Cataract     bilaterally  . Osteoarthritis   . Shortness of breath dyspnea     occ. with exertion"fat arount the heart"   Current Outpatient Prescriptions  Medication Sig Dispense Refill  . amoxicillin-clavulanate (AUGMENTIN) 875-125 MG per  tablet Take 1 tablet by mouth 2 (two) times daily. 21 tablet 0  . CVS D3 1000 UNITS capsule TAKE ONE CAPSULE BY MOUTH EVERY DAY 100 capsule 3  . ibuprofen (ADVIL,MOTRIN) 200 MG tablet Take 200 mg by mouth every 6 (six) hours as needed for mild pain.    Marland Kitchen KLOR-CON M20 20 MEQ tablet Take 1 tablet by mouth 2 (two) times daily.    Marland Kitchen lisinopril (PRINIVIL,ZESTRIL) 40 MG tablet TAKE 1 TABLET (40 MG TOTAL) BY MOUTH DAILY. 90 tablet 2  . loratadine (CLARITIN) 10 MG tablet Take 1 tablet (10 mg total) by mouth daily. 60 tablet 3  . mycophenolate (CELLCEPT) 250 MG capsule Take 4 capsules by mouth 2 (two) times daily.     Marland Kitchen omeprazole (PRILOSEC) 20 MG capsule Take 1 capsule (20 mg total) by mouth daily. 30 capsule 6  . oxyCODONE-acetaminophen (PERCOCET/ROXICET) 5-325 MG per tablet Take 1 tablet by mouth every 8 (eight) hours as needed for severe pain. 15 tablet 0  . predniSONE (DELTASONE) 10 MG tablet Take 10 mg by mouth daily with breakfast.     No current facility-administered medications for this visit.   Family History  Problem Relation Age of Onset  . Diabetes Mother   . Migraines Mother   . Cancer Brother   . Colon cancer Neg Hx   .  Colon polyps Neg Hx   . Rectal cancer Neg Hx   . Stomach cancer Neg Hx    Social History   Social History  . Marital Status: Single    Spouse Name: [name]  . Number of Children: 6  . Years of Education: [name]   Social History Main Topics  . Smoking status: Current Every Day Smoker -- 0.10 packs/day for 3 years    Types: Cigarettes  . Smokeless tobacco: Never Used     Comment: 3-4  cigs/day.  Cutting back.  6-7 cigerettes per day(1 p per 2 weeks)  . Alcohol Use: No  . Drug Use: No  . Sexual Activity: Not Asked   Other Topics Concern  . None   Social History Narrative   Last updated: 11/28/2009    Applied for disability but was denied, currently appealing, and currently unemployed   Previously worked at [employer]   Does hair on the side    Lives alone,  in a long-term relationship    6 children - living with one child currently, 30 years old, rest are grown   Smokes 4 cig daily, has cut back from 1 PPD   No alcohol or illicit drug use    Review of Systems: CONSTITUTIONAL- No Fever, or change in appetite. SKIN- No Rash, colour changes or itching. HEAD- No Headache or dizziness. RESPIRATORY- Has slight cough not productive, still smokes cigarette, no SOB CARDIAC- No Palpitations,has some anterior upper chest sssoreness with coughing and deep breathing only, not present with exertion. GI- No vomiting, diarrhoea, abd pain. URINARY- No Frequency, or dysuria. NEUROLOGIC- No Numbness, syncope, seizures or burning. [facility]- Lost her son - august 2016, but she has accepted this, Denies depression or anxiety.  Objective:  Physical Exam: Filed Vitals:   01/02/15 0921  BP: 135/90  Pulse: 89  Temp: 97.8 F (36.6 C)  TempSrc: Oral  Height: 5' 1.5" (1.562 m)  Weight: 194 lb 9.6 oz (88.27 kg)  SpO2: 98%   GENERAL- alert, co-operative, appears as stated age, not in any distress. HEENT- Atraumatic, normocephalic, PERRL, EOMI, oral mucosa appears slightly dry CARDIAC- RRR, no murmurs, rubs or gallops, no chest tenderness.  RESP- Moving equal volumes of air, and clear to auscultation bilaterally, no wheezes or crackles. ABDOMEN- Soft, nontender, bowel sounds present. BACK- Normal curvature of the spine, No tenderness along the vertebrae, no CVA tenderness. NEURO- No obvious Cr N abnormality, Gait- Normal. EXTREMITIES- pulse 2+, symmetric, has +1 pitting pedal edema to mid leg. SKIN- Warm, dry, No rash or lesion. PSYCH- Normal mood and affect, appropriate thought content and speech.  Assessment & Plan:  The patient's case and plan of care was discussed with attending physician, Dr. Rogelia Boga.  Please see problem based charting for assessment and plan.

## 2015-01-02 NOTE — Assessment & Plan Note (Addendum)
Bp meds- Triamterene- HCTZ d/c last visit- 12/19/2014 due to AKI and hypotension. BP today- 135/90. She has no dizziness and appetite is back. She is complaining of leg swelling today, involving her ankles so much so that she was not able to wear sandals, but flip flops to a funeral, she is asking to restart her Bp meds.   Plan- BMET today - if Cr is normal, also K,  Can start low dose lasix-  daily as needed, as we do not need blood pressure control, but to control fluid in her legs. -  Pt agrees to this plan. - Per pt and notes from [facility], pts cardiologist placed her on Potassium pills-  BID. Will edit pt med list. Pt was also on Triamterene, also is on Lisinopril. Consider hypoaldosteronism. But hypokalemia may be due to prior HCTZ use. So monitor K now that HCTZ has been discont, if k cont to be low, will work up for cause of hypokalemia, but might need to be off ACE- inh to check Renin-aldost, also urine k.   Addendum- 01/03/2015- Renal function back to baseline- 0.82. K- 3.7. Will start lasix-  daily PRN for pedal edema, rather than restart BP meds, as Bp is controlled.

## 2015-01-02 NOTE — Assessment & Plan Note (Signed)
Improved. BP better today. Appetite back to baseline.

## 2015-01-02 NOTE — Assessment & Plan Note (Signed)
Consider repeat DEXA scan next visit. As pt is on chronic opioids and current tobacco use. Last DEXA- 2012.

## 2015-01-02 NOTE — Patient Instructions (Addendum)
We will get the results of your blood work and decide which BP medication we will restart. We will start the fluid pill at a low dose.   Also please cont to work on quitting smoking.

## 2015-01-03 LAB — BASIC METABOLIC PANEL
BUN/Creatinine Ratio: 12 (ref 9–23)
BUN: 10 mg/dL (ref 6–24)
CALCIUM: 8.8 mg/dL (ref 8.7–10.2)
CO2: 22 mmol/L (ref 18–29)
Chloride: 104 mmol/L (ref 97–108)
Creatinine, Ser: 0.82 mg/dL (ref 0.57–1.00)
GFR, EST AFRICAN AMERICAN: 97 mL/min/{1.73_m2} (ref >59)
GFR, EST NON AFRICAN AMERICAN: 84 mL/min/{1.73_m2} (ref >59)
Glucose: 92 mg/dL (ref 65–99)
Potassium: 3.7 mmol/L (ref 3.5–5.2)
Sodium: 142 mmol/L (ref 134–144)

## 2015-01-03 LAB — HIV ANTIBODY (ROUTINE TESTING W REFLEX): HIV Screen 4th Generation wRfx: NONREACTIVE

## 2015-01-03 MED ORDER — FUROSEMIDE 20 MG PO TABS: 20.0000 mg | ORAL_TABLET | Freq: Every day | ORAL | Status: DC | PRN

## 2015-01-03 NOTE — Addendum Note (Signed)
Addended by: Onnie Boer on: 01/03/2015 09:05 AM   Modules accepted: Orders

## 2015-01-03 NOTE — Progress Notes (Signed)
Internal Medicine Clinic Attending  Case discussed with Dr. Emokpae soon after the resident saw the patient.  We reviewed the resident's history and exam and pertinent patient test results.  I agree with the assessment, diagnosis, and plan of care documented in the resident's note. 

## 2015-01-07 ENCOUNTER — Ambulatory Visit (HOSPITAL_COMMUNITY)
Admission: RE | Admit: 2015-01-07 | Discharge: 2015-01-07 | Disposition: A | Discharge status: Home / Self Care | Payer: Medicaid Other | Source: Ambulatory Visit | Attending: Gastroenterology | Admitting: Gastroenterology

## 2015-01-07 DIAGNOSIS — R6881 Early satiety: Secondary | ICD-10-CM | POA: Diagnosis present

## 2015-01-07 DIAGNOSIS — R1084 Generalized abdominal pain: Secondary | ICD-10-CM

## 2015-01-07 MED ORDER — TECHNETIUM TC 99M SULFUR COLLOID
2.2000 | Freq: Once | INTRAVENOUS | Status: DC | PRN
  Administered 2015-01-07: 2.2 via ORAL
  Filled 2015-01-07: qty 2.2

## 2015-01-07 MED ORDER — TECHNETIUM TC 99M SULFUR COLLOID: 2.2000 | Freq: Once | INTRAVENOUS | Status: DC | PRN

## 2015-01-08 ENCOUNTER — Encounter: Payer: Medicaid Other | Admitting: Internal Medicine

## 2015-01-10 ENCOUNTER — Telehealth: Payer: Self-pay | Admitting: Internal Medicine

## 2015-01-10 ENCOUNTER — Encounter: Payer: Self-pay | Admitting: Internal Medicine

## 2015-01-10 ENCOUNTER — Ambulatory Visit (INDEPENDENT_AMBULATORY_CARE_PROVIDER_SITE_OTHER): Payer: Medicaid Other | Admitting: Internal Medicine

## 2015-01-10 VITALS — BP 149/101 | HR 72 | Temp 97.8°F | Ht 61.5 in | Wt 191.6 lb

## 2015-01-10 DIAGNOSIS — I10 Essential (primary) hypertension: Secondary | ICD-10-CM

## 2015-01-10 MED ORDER — TRIAMTERENE-HCTZ 37.5-25 MG PO CAPS: 1.0000 | ORAL_CAPSULE | Freq: Every day | ORAL | Status: DC

## 2015-01-10 NOTE — Progress Notes (Signed)
Subjective:   Patient ID: Alexandria White female   DOB: [birth year] 49 y.o.   MRN: [id number]  HPI: [name] is a 49 y.o. with PMH listed below. Presented today with complainst of swelling of her feet.  Past Medical History  Diagnosis Date  . Cystic acne   . 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 // AFP 15 (03/2009)  // Was changed from azathioprine bc unable to achieve remission --> now chronically on Cellcept and prednisone since May 2011// Followed by Dr . Alexandria White ([facility]), and now with Dr. Larey White ([facility])  . 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 Perimenopausal   . Macrocytosis without anemia     BL MCV 102-111. Unclear cause. Possibly due to hypothyroidism vs liver disease (although no clear history of alcohol abuse)  // RBC folate and B12 are within normal limits  . Tobacco abuse   . HTN (hypertension)   . GERD (gastroesophageal reflux disease)     EGD (10/2010) - showing mild gastritis, bx negative - by Dr. Christella White  . Headache(784.0)     tension with features of migraine  . History of rectal bleeding     2/2 hemorrhoids, colonoscopy 2008 --> next due 2018  . Hemorrhoids     Small internal and external hemorrhoids noted on colonoscopy (05/2006) - thought to be the cause of the rectal bleeding that she noted.   . Liver cirrhosis secondary to NASH (nonalcoholic steatohepatitis)   . Migraines   . Cataract     bilaterally  . Osteoarthritis   . Shortness of breath dyspnea     occ. with exertion"fat arount the heart"   Current Outpatient Prescriptions  Medication Sig Dispense Refill  . CVS D3 1000 UNITS capsule TAKE ONE CAPSULE BY MOUTH EVERY DAY 100 capsule 3  . ibuprofen (ADVIL,MOTRIN) 200 MG tablet Take 200 mg by mouth every 6 (six) hours as  needed for mild pain.    Marland Kitchen KLOR-CON M20 20 MEQ tablet Take 1 tablet by mouth 2 (two) times daily.    Marland Kitchen lisinopril (PRINIVIL,ZESTRIL) 40 MG tablet TAKE 1 TABLET (40 MG TOTAL) BY MOUTH DAILY. 90 tablet 2  . loratadine (CLARITIN) 10 MG tablet Take 1 tablet (10 mg total) by mouth daily. 60 tablet 3  . mycophenolate (CELLCEPT) 250 MG capsule Take 4 capsules by mouth 2 (two) times daily.     Marland Kitchen omeprazole (PRILOSEC) 20 MG capsule Take 1 capsule (20 mg total) by mouth daily. 30 capsule 6  . oxyCODONE-acetaminophen (PERCOCET/ROXICET) 5-325 MG per tablet Take 1 tablet by mouth every 8 (eight) hours as needed for severe pain. 15 tablet 0  . predniSONE (DELTASONE) 10 MG tablet Take 10 mg by mouth daily with breakfast.    . triamterene-hydrochlorothiazide (DYAZIDE) 37.5-25 MG per capsule Take 1 each (1 capsule total) by mouth daily. 90 capsule 0   No current facility-administered medications for this visit.   Facility-Administered Medications Ordered in Other Visits  Medication Dose Route Frequency Provider Last Rate Last Dose  . technetium sulfur colloid (NYCOMED-) injection solution 2.2 milli Curie  2.2 milli Curie Oral Once PRN Medication Radiologist, MD   2.2 milli Curie at 01/07/15 0745   Family History  Problem Relation Age  of Onset  . Diabetes Mother   . Migraines Mother   . Cancer Brother   . Colon cancer Neg Hx   . Colon polyps Neg Hx   . Rectal cancer Neg Hx   . Stomach cancer Neg Hx    Social History   Social History  . Marital Status: Single    Spouse Name: [name]  . Number of Children: 6  . Years of Education: [name]   Social History Main Topics  . Smoking status: Current Every Day Smoker -- 0.10 packs/day for 3 years    Types: Cigarettes  . Smokeless tobacco: Never Used     Comment: 3-4  cigs/day.  Cutting back.  6-7 cigerettes per day(1 p per 2 weeks)  . Alcohol Use: No  . Drug Use: No  . Sexual Activity: Not Asked   Other Topics Concern  . None   Social History Narrative     Last updated: 11/28/2009    Applied for disability but was denied, currently appealing, and currently unemployed   Previously worked at [employer]   Does hair on the side    Lives alone, in a long-term relationship    6 children - living with one child currently, 59 years old, rest are grown   Smokes 4 cig daily, has cut back from 1 PPD   No alcohol or illicit drug use    Review of Systems: CONSTITUTIONAL- No Fever, weightloss, or change in appetite. SKIN- No Rash, colour changes or itching. HEAD- No Headache or dizziness. RESPIRATORY- No Cough or SOB. CARDIAC- No Palpitations, or chest pain. GI- No  vomiting, diarrhoea, abd pain. URINARY- No Frequency, urgency, straining or dysuria.  Objective:  Physical Exam: Filed Vitals:   01/10/15 1121  BP: 149/101  Pulse: 72  Temp: 97.8 F (36.6 C)  TempSrc: Oral  Height: 5' 1.5" (1.562 m)  Weight: 191 lb 9.6 oz (86.909 kg)  SpO2: 100%   GENERAL- alert, co-operative, appears as stated age, not in any distress. HEENT- Atraumatic, normocephalic, PERRL CARDIAC- RRR, no murmurs, rubs or gallops. RESP- Moving equal volumes of air, and clear to auscultation bilaterally, no wheezes or crackles. ABDOMEN- Soft, nontender EXTREMITIES- +2 symmetric DP pulse, 1+ pitting  pedal edema to mid chin SKIN- Warm, dry, No rash or lesion. PSYCH- Normal mood and affect, appropriate thought content and speech.  Assessment & Plan:   The patient's case and plan of care was discussed with attending physician, Dr. Dalphine Handing.  Please see problem based charting for assessment and plan.

## 2015-01-10 NOTE — Assessment & Plan Note (Signed)
Seen a week ago. Lasix-  PRN was started as BP was WNL and leg were slightly swollen. She says she has not been making a lot of urine, the way she used to while she was on triamterene- HCTZ, which she had been taking since she was 49 yrs old. She has completely recovered from her gastroenteritis, last BMEt showed her Cr has normalized, electrolytes WNL, BP today- 149/101.  ECHO- 2015 available on Care everywhere- EF- 55%, mild LVH. Last CMEt 12/13/2014 - Albumin - 3.4.  Plan- can resume her Bp meds- Triamterene- HCTZ- 37.5-  daily - Can stop PRN lasix - Check BP daily with home BP device ,bring records next visit. - Pt cautioned about low BP and Dizziness.

## 2015-01-10 NOTE — Telephone Encounter (Signed)
Pt states her legs/ ankles are more swollen since starting lasix, appt 1045 today w/ dr Mariea Clonts

## 2015-01-10 NOTE — Progress Notes (Signed)
Internal Medicine Clinic Attending  Case discussed with Dr. Mariea Clonts at the time  the resident saw the patient.  We reviewed the resident's history and exam and pertinent patient test results.  I agree with the assessment, diagnosis, and plan of care documented in the resident's note.

## 2015-01-10 NOTE — Telephone Encounter (Signed)
Please call pt back regarding her taking the lasix.

## 2015-01-10 NOTE — Patient Instructions (Signed)
We have restarted your blood pressure medication- Triamterene-HCTZ at the usual dose.  Also please check your blood pressure at any of the drug stores, write it down and bring it to your next clinic visit.

## 2015-01-29 ENCOUNTER — Encounter: Payer: Self-pay | Admitting: Gastroenterology

## 2015-03-12 ENCOUNTER — Telehealth: Payer: Self-pay | Admitting: Gastroenterology

## 2015-03-12 NOTE — Telephone Encounter (Signed)
The pt returned call and states that she has an appt to see her Liver specialist in [place] and will call back with any further concerns

## 2015-03-12 NOTE — Telephone Encounter (Signed)
Left message on machine to call back  

## 2015-03-20 ENCOUNTER — Other Ambulatory Visit: Payer: Self-pay | Admitting: Internal Medicine

## 2015-04-26 ENCOUNTER — Other Ambulatory Visit: Payer: Self-pay | Admitting: Internal Medicine

## 2015-05-02 ENCOUNTER — Telehealth: Payer: Self-pay | Admitting: Internal Medicine

## 2015-05-02 NOTE — Telephone Encounter (Signed)
Pt states Dyazide is not covered through insurance. Pt have not taking the medicine for three days. Please call pt back.

## 2015-05-06 ENCOUNTER — Other Ambulatory Visit: Payer: Self-pay | Admitting: Internal Medicine

## 2015-05-06 MED ORDER — HYDROCHLOROTHIAZIDE 25 MG PO TABS: 25.0000 mg | ORAL_TABLET | Freq: Every day | ORAL | Status: DC

## 2015-05-06 NOTE — Progress Notes (Signed)
Patient called saying her medication- triamterene-HCTZ is no longer covered by her insurance. She is presently also on Lisinopril. Triamterene was added in the past due to pt developing hypokalemia while on HCTZ. Pt has not taken medication in at least 3 days, and has not checked her blood pressure.  Will call in HCTZ alone, she will be called for an appointment in 1-2 weeks, bp can be checked and Amlodipine added if needed and BMET check. She does not remember ever been on Amlodipine.  [name].

## 2015-05-14 NOTE — Telephone Encounter (Signed)
Called patient back- 05/06/2015, but for some reason can not see call and plan documentation in EPIC, decided to use HCTZ alone instead of HCTZ- tirimterene. Bp check in 2 weeks and labs. She might need k supplimentation, as she has been hypokalemic in the past likely due to HCTZ, while on lisinopril. Hypokalemia likely to reoccur with discont of triamterene. If Bp elevated, we can try Norvasc. Thanks.   E.

## 2015-05-15 ENCOUNTER — Ambulatory Visit: Payer: Medicaid Other | Admitting: Internal Medicine

## 2015-05-29 ENCOUNTER — Ambulatory Visit: Payer: Medicaid Other | Admitting: Internal Medicine

## 2015-05-30 ENCOUNTER — Encounter: Payer: Self-pay | Admitting: Internal Medicine

## 2015-05-30 ENCOUNTER — Ambulatory Visit (INDEPENDENT_AMBULATORY_CARE_PROVIDER_SITE_OTHER): Payer: Medicaid Other | Admitting: Internal Medicine

## 2015-05-30 VITALS — BP 156/99 | HR 103 | Temp 97.2°F | Ht 61.0 in | Wt 182.0 lb

## 2015-05-30 DIAGNOSIS — I10 Essential (primary) hypertension: Secondary | ICD-10-CM

## 2015-05-30 DIAGNOSIS — K754 Autoimmune hepatitis: Secondary | ICD-10-CM | POA: Diagnosis not present

## 2015-05-30 DIAGNOSIS — Z7952 Long term (current) use of systemic steroids: Secondary | ICD-10-CM

## 2015-05-30 DIAGNOSIS — M79641 Pain in right hand: Secondary | ICD-10-CM
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
## 2015-05-30 NOTE — Assessment & Plan Note (Signed)
Patient was able to get her HCTZ-triamterene afterall. It was a problem with her insurance which has ben extended and will expire- October this year. Pt to make appointment to talk with our financial counsellor, and also getting the [place] discount card.  Bp elevated at 156/99 today, was doing very well on present regimen previously. She says he has been taking a lot of salt in her diet, which she didn't do before. She will like to cut down on her salt intake first and reaccess. Plan- To check Bmet,- K, but she left before getting lab work. - Will call pt to come in and get lab work done.

## 2015-05-30 NOTE — Assessment & Plan Note (Signed)
Follows with hepatologist at [facility]. She has been compliant with her mycofenolate and prednisone. Last appt- Nov 2016. She is schedled for a liver Ultrasound to evaluate for hepatocellular Ca.

## 2015-05-30 NOTE — Progress Notes (Signed)
Internal Medicine Clinic Attending  Case discussed with Dr. Emokpae soon after the resident saw the patient.  We reviewed the resident's history and exam and pertinent patient test results.  I agree with the assessment, diagnosis, and plan of care documented in the resident's note. 

## 2015-05-30 NOTE — Patient Instructions (Signed)
Please reduce salt in your diet, this is important for your blood pressure.

## 2015-05-31 NOTE — Addendum Note (Signed)
Addended by: Onnie Boer on: 05/31/2015 03:41 PM   Modules accepted: Orders

## 2015-06-04 ENCOUNTER — Other Ambulatory Visit (INDEPENDENT_AMBULATORY_CARE_PROVIDER_SITE_OTHER): Payer: Medicaid Other

## 2015-06-04 DIAGNOSIS — I10 Essential (primary) hypertension: Secondary | ICD-10-CM

## 2015-06-05 LAB — BMP8+ANION GAP
ANION GAP: 16 mmol/L (ref 10.0–18.0)
BUN/Creatinine Ratio: 16 (ref 9–23)
BUN: 13 mg/dL (ref 6–24)
CO2: 24 mmol/L (ref 18–29)
Calcium: 9.3 mg/dL (ref 8.7–10.2)
Chloride: 99 mmol/L (ref 96–106)
Creatinine, Ser: 0.81 mg/dL (ref 0.57–1.00)
GFR calc Af Amer: 99 mL/min/{1.73_m2} (ref >59)
GFR calc non Af Amer: 86 mL/min/{1.73_m2} (ref >59)
GLUCOSE: 96 mg/dL (ref 65–99)
POTASSIUM: 3.7 mmol/L (ref 3.5–5.2)
SODIUM: 139 mmol/L (ref 134–144)

## 2015-07-15 ENCOUNTER — Encounter (HOSPITAL_COMMUNITY): Payer: Self-pay | Admitting: Emergency Medicine

## 2015-07-15 ENCOUNTER — Emergency Department (HOSPITAL_COMMUNITY)
Admission: EM | Admit: 2015-07-15 | Discharge: 2015-07-15 | Disposition: A | Discharge status: Home / Self Care | Payer: Medicaid Other | Attending: Emergency Medicine | Admitting: Emergency Medicine

## 2015-07-15 ENCOUNTER — Emergency Department (HOSPITAL_COMMUNITY): Payer: Medicaid Other

## 2015-07-15 ENCOUNTER — Emergency Department (HOSPITAL_BASED_OUTPATIENT_CLINIC_OR_DEPARTMENT_OTHER): Payer: Medicaid Other

## 2015-07-15 DIAGNOSIS — M25561 Pain in right knee: Secondary | ICD-10-CM

## 2015-07-15 DIAGNOSIS — F17210 Nicotine dependence, cigarettes, uncomplicated: Secondary | ICD-10-CM | POA: Insufficient documentation

## 2015-07-15 DIAGNOSIS — M1990 Unspecified osteoarthritis, unspecified site: Secondary | ICD-10-CM | POA: Diagnosis not present

## 2015-07-15 DIAGNOSIS — Z872 Personal history of diseases of the skin and subcutaneous tissue: Secondary | ICD-10-CM | POA: Diagnosis not present

## 2015-07-15 DIAGNOSIS — R600 Localized edema: Secondary | ICD-10-CM | POA: Diagnosis not present

## 2015-07-15 DIAGNOSIS — Z8742 Personal history of other diseases of the female genital tract: Secondary | ICD-10-CM | POA: Diagnosis not present

## 2015-07-15 DIAGNOSIS — Z8719 Personal history of other diseases of the digestive system: Secondary | ICD-10-CM | POA: Insufficient documentation

## 2015-07-15 DIAGNOSIS — Z91040 Latex allergy status: Secondary | ICD-10-CM | POA: Insufficient documentation

## 2015-07-15 DIAGNOSIS — Z8639 Personal history of other endocrine, nutritional and metabolic disease: Secondary | ICD-10-CM | POA: Diagnosis not present

## 2015-07-15 DIAGNOSIS — M79609 Pain in unspecified limb: Secondary | ICD-10-CM

## 2015-07-15 DIAGNOSIS — Z862 Personal history of diseases of the blood and blood-forming organs and certain disorders involving the immune mechanism: Secondary | ICD-10-CM | POA: Insufficient documentation

## 2015-07-15 DIAGNOSIS — I10 Essential (primary) hypertension: Secondary | ICD-10-CM | POA: Diagnosis not present

## 2015-07-15 DIAGNOSIS — Z79899 Other long term (current) drug therapy: Secondary | ICD-10-CM | POA: Diagnosis not present

## 2015-07-15 DIAGNOSIS — Z7982 Long term (current) use of aspirin: Secondary | ICD-10-CM | POA: Insufficient documentation

## 2015-07-15 DIAGNOSIS — R202 Paresthesia of skin: Secondary | ICD-10-CM | POA: Insufficient documentation

## 2015-07-15 MED ORDER — OXYCODONE HCL 5 MG PO TABS
5.0000 mg | ORAL_TABLET | Freq: Once | ORAL | Status: AC
  Administered 2015-07-15: 5 mg via ORAL
  Filled 2015-07-15: qty 1

## 2015-07-15 MED ORDER — OXYCODONE HCL 5 MG PO TABS: 5.0000 mg | ORAL_TABLET | ORAL | Status: DC | PRN

## 2015-07-15 MED ORDER — IBUPROFEN 800 MG PO TABS: 800.0000 mg | ORAL_TABLET | Freq: Three times a day (TID) | ORAL | Status: DC

## 2015-07-15 NOTE — Progress Notes (Signed)
VASCULAR LAB PRELIMINARY  PRELIMINARY  PRELIMINARY  PRELIMINARY  Right lower extremity venous duplex completed.    Preliminary report:  Right:  No evidence of DVT, superficial thrombosis, or Baker's cyst.  There is sluggish flow in the distal FV/proximal popliteal vein.  Ozzie Remmers, RVT 07/15/2015, 11:41 AM

## 2015-07-15 NOTE — Discharge Instructions (Signed)
1. Medications: pain medicine, ibuprofen, usual home medications 2. Treatment: rest, drink plenty of fluids, wear knee sleeve, ice and elevate 3. Follow Up: please followup with orthopedics for discussion of your diagnoses and further evaluation after today's visit; if you do not have a primary care doctor use the phone number listed in your discharge paperwork to find one; please return to the ER for increased pain, swelling, numbness, new or worsening symptoms   Knee Pain Knee pain is a common problem. It can have many causes. The pain often goes away by following your doctor's home care instructions. Treatment for ongoing pain will depend on the cause of your pain. If your knee pain continues, more tests may be needed to diagnose your condition. Tests may include X-rays or other imaging studies of your knee. HOME CARE  Take medicines only as told by your doctor.  Rest your knee and keep it raised (elevated) while you are resting.  Do not do things that cause pain or make your pain worse.  Avoid activities where both feet leave the ground at the same time, such as running, jumping rope, or doing jumping jacks.  Apply ice to the knee area:  Put ice in a plastic bag.  Place a towel between your skin and the bag.  Leave the ice on for 20 minutes, 2-3 times a day.  Ask your doctor if you should wear an elastic knee support.  Sleep with a pillow under your knee.  Lose weight if you are overweight. Being overweight can make your knee hurt more.  Do not use any tobacco products, including cigarettes, chewing tobacco, or electronic cigarettes. If you need help quitting, ask your doctor. Smoking may slow the healing of any bone and joint problems that you may have. GET HELP IF:  Your knee pain does not stop, it changes, or it gets worse.  You have a fever along with knee pain.  Your knee gives out or locks up.  Your knee becomes more swollen. GET HELP RIGHT AWAY IF:   Your knee  feels hot to the touch.  You have chest pain or trouble breathing.   This information is not intended to replace advice given to you by your health care provider. Make sure you discuss any questions you have with your health care provider.   Document Released: 07/10/2008 Document Revised: 05/04/2014 Document Reviewed: 06/14/2013 Elsevier Interactive Patient Education [employer]2016 Elsevier Inc.

## 2015-07-15 NOTE — ED Notes (Signed)
Pt c/o sudden onset R knee pain and lateral swelling x 2 days. Pt sts this happened one time before and she didn't get treatment it just went away. Pt in tears because of the pain in triage. Pt sts she can not walk on it. Pt's daughter brought pt in. A&Ox4. Denies injury.

## 2015-07-15 NOTE — ED Provider Notes (Signed)
CSN: [id number]     Arrival date & time 07/15/15  4098 History   First MD Initiated Contact with Patient 07/15/15 1015     Chief Complaint  Patient presents with  . Knee Pain    HPI   Alexandria White is a 50 y.o. female with a PMH of HTN, autoimmune hepatitis, hashimoto's thyroiditis, cirrhosis who presents to the ED with right knee pain and swelling x 2 days. 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 She reports her pain is constant. She reports movement exacerbates her pain. She denies alleviating factors. She denies fever and chills. She reports tingling in her right foot. She states she experienced similar symptoms in November, however did not seek treatment at that time and her symptoms resolved without intervention after 3-4 days.   Past Medical History  Diagnosis Date  . Cystic acne   . Autoimmune hepatitis (HCC) DX: Sept 2010    Initial labs (12/2008) - ANA [name] neg, F actin / IgG Ab high at 53 // Cirrhosis on index liver bx (03/2009) and at least bridging fibrosis on 2nd biopsy in 01/2010. // AFP 15 (03/2009)  // Was changed from azathioprine bc unable to achieve remission --> now chronically on Cellcept and prednisone since May 2011// Followed by Dr . Christella Hartigan ([facility]), and now with Dr. Larey Dresser ([facility])  . 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 Perimenopausal   . Macrocytosis without anemia     BL MCV 102-111. Unclear cause. Possibly due to hypothyroidism vs liver disease (although no clear history of alcohol abuse)  // RBC folate and B12 are within normal limits  . Tobacco abuse   . HTN (hypertension)   . GERD (gastroesophageal reflux disease)     EGD (10/2010) - showing mild gastritis, bx negative - by Dr. Christella Hartigan  . Headache(784.0)     tension with features of migraine  . History of rectal bleeding     2/2 hemorrhoids, colonoscopy 2008 --> next due 2018  . Hemorrhoids     Small internal and external  hemorrhoids noted on colonoscopy (05/2006) - thought to be the cause of the rectal bleeding that she noted.   . Liver cirrhosis secondary to NASH (nonalcoholic steatohepatitis)   . Migraines   . Cataract     bilaterally  . Osteoarthritis   . Shortness of breath dyspnea     occ. with exertion"fat arount the heart"   Past Surgical History  Procedure Laterality Date  . Cholecystectomy  1992    laparoscopically   . Ercp    . Colonoscopy    . Tubal ligation      fibroid tumor removed from left fallopian tube  . Carpal tunnel release    . Dilation and curettage of uterus    . Esophagogastroduodenoscopy (egd) with propofol [name] 06/21/2014    Procedure: ESOPHAGOGASTRODUODENOSCOPY (EGD) WITH PROPOFOL;  Surgeon: Rachael Fee, MD;  Location: [facility];  Service: Endoscopy;  Laterality: [name];   Family History  Problem Relation Age of Onset  . Diabetes Mother   . Migraines Mother   . Cancer Brother   . Colon cancer Neg Hx   . Colon polyps Neg Hx   . Rectal cancer Neg Hx   . Stomach cancer Neg Hx    Social History  Substance Use Topics  . Smoking status: Current Every Day Smoker -- 0.10 packs/day for 3 years  Types: Cigarettes  . Smokeless tobacco: Never Used     Comment: 3-4  cigs/day.  Cutting back.  6-7 cigerettes per day(1 p per 2 weeks)  . Alcohol Use: No   OB History    No data available      Review of Systems  Constitutional: Negative for fever and chills.  Musculoskeletal: Positive for joint swelling and arthralgias.  Neurological: Negative for weakness and numbness.      Allergies  Aspirin and Latex  Home Medications   Prior to Admission medications   Medication Sig Start Date End Date Taking? Authorizing Provider  [facility] D3 1000 UNITS capsule TAKE ONE CAPSULE BY MOUTH EVERY DAY 08/06/14  Yes Ejiroghene E Emokpae, MD  ibuprofen (ADVIL,MOTRIN) 200 MG tablet Take 200 mg by mouth every 6 (six) hours as needed for mild pain.   Yes Historical Provider, MD    KLOR-CON M20 20 MEQ tablet Take 1 tablet by mouth 2 (two) times daily. 05/22/14  Yes Historical Provider, MD  lisinopril (PRINIVIL,ZESTRIL) 40 MG tablet TAKE 1 TABLET (40 MG TOTAL) BY MOUTH DAILY. 10/09/14  Yes Ejiroghene E Emokpae, MD  loratadine (CLARITIN) 10 MG tablet Take 1 tablet (10 mg total) by mouth daily. Patient taking differently: Take 10 mg by mouth daily as needed for allergies.  07/31/14  Yes Ejiroghene Wendall Stade, MD  Multiple Vitamins-Minerals (MULTIVITAMIN WITH MINERALS) tablet Take 1 tablet by mouth daily.   Yes Historical Provider, MD  mycophenolate (CELLCEPT) 250 MG capsule Take 4 capsules by mouth 2 (two) times daily.  04/29/10  Yes Historical Provider, MD  omeprazole (PRILOSEC) 20 MG capsule TAKE 1 CAPSULE (20 MG TOTAL) BY MOUTH DAILY. 03/24/15  Yes Ejiroghene E Emokpae, MD  predniSONE (DELTASONE) 5 MG tablet Take 5 mg by mouth daily with breakfast.   Yes Historical Provider, MD  triamterene-hydrochlorothiazide (DYAZIDE) 37.5-25 MG capsule Take 1 capsule by mouth daily. 06/26/15  Yes Historical Provider, MD  hydrochlorothiazide (HYDRODIURIL) 25 MG tablet Take 1 tablet (25 mg total) by mouth daily. Patient not taking: Reported on 07/15/2015 05/06/15   Ejiroghene E Emokpae, MD    BP 114/95 mmHg  Pulse 78  Temp(Src) 98.6 F (37 C) (Oral)  Resp 16  SpO2 100%  LMP 11/30/2010 Physical Exam  Constitutional: She is oriented to person, place, and time. She appears well-developed and well-nourished. No distress.  HENT:  Head: Normocephalic and atraumatic.  Right Ear: External ear normal.  Left Ear: External ear normal.  Nose: Nose normal.  Eyes: Conjunctivae and EOM are normal. Right eye exhibits no discharge. Left eye exhibits no discharge. No scleral icterus.  Neck: Normal range of motion. Neck supple.  Cardiovascular: Normal rate, regular rhythm and intact distal pulses.   Pulmonary/Chest: Effort normal and breath sounds normal. No respiratory distress.  Musculoskeletal: She  exhibits edema and tenderness.       Legs: TTP to lateral and posterior aspect of right knee with minimal associated edema and decreased ROM due to pain. No significant erythema or heat. No MCL or LCL laxity. Negative posterior and anterior drawer. Patient able to straight leg raise RLE.  Neurological: She is alert and oriented to person, place, and time. She has normal strength. No sensory deficit.  Skin: Skin is warm and dry. She is not diaphoretic.  Psychiatric: She has a normal mood and affect. Her behavior is normal.  Nursing note and vitals reviewed.   ED Course  Procedures (including critical care time)  Labs Review Labs Reviewed - No data to  display  Imaging Review Dg Knee Complete 4 Views Right  07/15/2015  CLINICAL DATA:  Right knee pain starting yesterday, no known injury EXAM: RIGHT KNEE - COMPLETE 4+ VIEW COMPARISON:  12/12/2011 FINDINGS: Five views of the right knee submitted. No acute fracture or subluxation. No pathologic calcifications are noted. Minimal narrowing of medial joint compartment. No joint effusion. IMPRESSION: No acute fracture or subluxation. Minimal narrowing of medial joint compartment. No joint effusion. Electronically Signed   By: Natasha MeadLiviu  Pop M.D.   On: 07/15/2015 10:26   I have personally reviewed and evaluated these images as part of my medical decision-making.   EKG Interpretation None      MDM   Final diagnoses:  Right knee pain    50 year old female presents with right knee pain and swelling x 2 days. Notes associated paresthesia in her right foot. Denies fever, chills. Patient is afebrile. Vital signs stable. On exam, patient appears very uncomfortable due to pain and has TTP to the lateral and posterior aspects of her right knee with minimal edema and decreased ROM due to pain. No significant erythema or heat. Patient is neurovascularly intact.   Imaging negative for fracture or subluxation, reveals minimal narrowing of medial joint  compartment, no joint effusion. Discussed findings with patient. 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 Doubt septic joint or gout.  US negative for DVT. Discussed findings with patient, who reports symptom improvement s/p pain medication. Patient is non-toxic and well-appearing, feel she is stable for discharge at this time. Will give short course of pain medication and anti-inflammatory as well as knee sleeve. Patient to follow-up with PCP and with orthopedics for further evaluation and management of symptoms. Return precautions discussed. Patient verbalizes her understanding and is in agreement with plan.  BP 114/95 mmHg  Pulse 78  Temp(Src) 98.6 F (37 C) (Oral)  Resp 16  SpO2 100%  LMP 11/30/2010     Mady GemmaElizabeth C Westfall, PA-C 07/15/15 1540  Azalia BilisKevin Campos, MD 07/15/15 [phone]

## 2015-08-05 ENCOUNTER — Other Ambulatory Visit: Payer: Self-pay | Admitting: Internal Medicine

## 2015-08-21 ENCOUNTER — Ambulatory Visit (INDEPENDENT_AMBULATORY_CARE_PROVIDER_SITE_OTHER): Payer: Medicaid Other | Admitting: Internal Medicine

## 2015-08-21 ENCOUNTER — Encounter: Payer: Self-pay | Admitting: Internal Medicine

## 2015-08-21 VITALS — BP 113/63 | HR 87 | Temp 98.1°F | Ht 61.0 in | Wt 174.1 lb

## 2015-08-21 DIAGNOSIS — M542 Cervicalgia: Secondary | ICD-10-CM

## 2015-08-21 MED ORDER — CYCLOBENZAPRINE HCL 5 MG PO TABS: 5.0000 mg | ORAL_TABLET | Freq: Every evening | ORAL | Status: AC | PRN

## 2015-08-21 NOTE — Progress Notes (Signed)
Case discussed with Dr. Gill soon after the resident saw the patient.  We reviewed the resident's history and exam and pertinent patient test results.  I agree with the assessment, diagnosis, and plan of care documented in the resident's note. 

## 2015-08-21 NOTE — Progress Notes (Signed)
Patient ID: Alexandria StableBeatrice A White, female   DOB: [birth year], 50 y.o.   MRN: [id number]     Subjective:   Patient ID: Alexandria White female    DOB: [birth year] 50 y.o.    MRN: [id number] Health Maintenance Due: There are no preventive care reminders to display for this patient.  _________________________________________________  HPI: [name] is a 50 y.o. female here for acute on chronic neck pain. Pt has a PMH outlined below.  Please see problem-based charting assessment and plan for further status of patient's chronic medical problems addressed at today's visit.  PMH: Past Medical History  Diagnosis Date  . Cystic acne   . Autoimmune hepatitis (HCC) DX: Sept 2010    Initial labs (12/2008) - ANA Alexandria White/AMA neg, F actin / IgG Ab high at 53 // Cirrhosis on index liver bx (03/2009) and at least bridging fibrosis on 2nd biopsy in 01/2010. // AFP 15 (03/2009)  // Was changed from azathioprine bc unable to achieve remission --> now chronically on Cellcept and prednisone since May 2011// Followed by Dr . Christella HartiganJacobs ([facility]), and now with Dr. Larey DresserAlastair Smith ([facility])  . 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 Perimenopausal   . Macrocytosis without anemia     BL MCV 102-111. Unclear cause. Possibly due to hypothyroidism vs liver disease (although no clear history of alcohol abuse)  // RBC folate and B12 are within normal limits  . Tobacco abuse   . HTN (hypertension)   . GERD (gastroesophageal reflux disease)     EGD (10/2010) - showing mild gastritis, bx negative - by Dr. Christella HartiganJacobs  . Headache(784.0)     tension with features of migraine  . History of rectal bleeding     2/2 hemorrhoids, colonoscopy 2008 --> next due 2018  . Hemorrhoids     Small internal and external hemorrhoids noted on colonoscopy (05/2006) - thought to be the cause of the rectal bleeding that she noted.   . Liver cirrhosis secondary to NASH (nonalcoholic steatohepatitis)    . Migraines   . Cataract     bilaterally  . Osteoarthritis   . Shortness of breath dyspnea     occ. with exertion"fat arount the heart"    Medications: Current Outpatient Prescriptions on File Prior to Visit  Medication Sig Dispense Refill  . CVS D3 1000 UNITS capsule TAKE ONE CAPSULE BY MOUTH EVERY DAY 100 capsule 3  . KLOR-CON M20 20 MEQ tablet Take 1 tablet by mouth 2 (two) times daily.    Marland Kitchen. lisinopril (PRINIVIL,ZESTRIL) 40 MG tablet TAKE 1 TABLET BY MOUTH EVERY DAY 90 tablet 2  . loratadine (CLARITIN) 10 MG tablet Take 1 tablet (10 mg total) by mouth daily. (Patient taking differently: Take 10 mg by mouth daily as needed for allergies. ) 60 tablet 3  . Multiple Vitamins-Minerals (MULTIVITAMIN WITH MINERALS) tablet Take 1 tablet by mouth daily.    . mycophenolate (CELLCEPT) 250 MG capsule Take 4 capsules by mouth 2 (two) times daily.     Marland Kitchen. omeprazole (PRILOSEC) 20 MG capsule TAKE 1 CAPSULE (20 MG TOTAL) BY MOUTH DAILY. 30 capsule 6  . predniSONE (DELTASONE) 5 MG tablet Take 5 mg by mouth daily with breakfast.    . triamterene-hydrochlorothiazide (DYAZIDE) 37.5-25 MG capsule Take 1 capsule by mouth daily.  1   No current facility-administered medications on file prior to visit.    Allergies: Allergies  Allergen Reactions  . Aspirin  Nausea Only    bleeding Stomach pain  . Latex Rash    FH: Family History  Problem Relation Age of Onset  . Diabetes Mother   . Migraines Mother   . Cancer Brother   . Colon cancer Neg Hx   . Colon polyps Neg Hx   . Rectal cancer Neg Hx   . Stomach cancer Neg Hx     SH: Social History   Social History  . Marital Status: Single    Spouse Name: [name]  . Number of Children: 6  . Years of Education: [name]   Social History Main Topics  . Smoking status: Current Every Day Smoker -- 0.10 packs/day for 3 years    Types: Cigarettes  . Smokeless tobacco: Never Used     Comment: 3-4  cigs/day.  Cutting back.  6-7 cigerettes per day(1 p per 2  weeks)  . Alcohol Use: No  . Drug Use: No  . Sexual Activity: Not Asked   Other Topics Concern  . None   Social History Narrative   Last updated: 11/28/2009    Applied for disability but was denied, currently appealing, and currently unemployed   Previously worked at [employer]   Does hair on the side    Lives alone, in a long-term relationship    6 children - living with one child currently, 49 years old, rest are grown   Smokes 4 cig daily, has cut back from 1 PPD   No alcohol or illicit drug use     Review of Systems: Constitutional: Negative for fever, chills.  Eyes: Negative for blurred vision.  Respiratory: Negative for cough and shortness of breath.  Cardiovascular: Negative for chest pain.  Gastrointestinal: Negative for nausea, vomiting. Musculoskeletal: +Neck pain.  Neurological: Negative for dizziness.   Objective:   Vital Signs: Filed Vitals:   08/21/15 0841  BP: 113/63  Pulse: 87  Temp: 98.1 F (36.7 C)  TempSrc: Oral  Height:  (1.549 m)  Weight: 174 lb 1.6 oz (78.971 kg)  SpO2: 99%      BP Readings from Last 3 Encounters:  08/21/15 113/63  07/15/15 128/84  05/30/15 156/99    Physical Exam: Constitutional: Vital signs reviewed.  Patient is in NAD and cooperative with exam.  Head: Normocephalic and atraumatic. Eyes: EOMI, conjunctivae nl, no scleral icterus.  Neck: Supple. Cardiovascular: RRR, no MRG. Pulmonary/Chest: normal effort, CTAB, no wheezes, rales, or rhonchi. Abdominal: Soft. NT/ND +BS. Musculoskeletal: Cervical spine: decreased ROM to the left, reflexes intact, radial pulse intact, normal sensation.    Neurological: A&O x3, cranial nerves II-XII are grossly intact, moving all extremities. Skin: Warm, dry and intact. No rash.   Assessment & Plan:   Assessment and plan was discussed and formulated with my attending.

## 2015-08-21 NOTE — Patient Instructions (Signed)
Thank you for your visit today.   Please return to the internal medicine clinic in about 6 weeks or sooner if needed.     I have made the following additions/changes to your medications:  I have given you flexeril to use at bedtime as needed for your neck spasms. I will also refer you to physical therapy since this is an ongoing issue.  You may also try a warm compress to relax the muscles.   Please be sure to bring all of your medications with you to every visit; this includes herbal supplements, vitamins, eye drops, and any over-the-counter medications.   Should you have any questions regarding your medications and/or any new or worsening symptoms, please be sure to call the clinic at [phone].   If you believe that you are suffering from a life threatening condition or one that may result in the loss of limb or function, then you should call 911 and proceed to the nearest Emergency Department.   A healthy lifestyle and preventative care can promote health and wellness.   Maintain regular health, dental, and eye exams.  Eat a healthy diet. Foods like vegetables, fruits, whole grains, low-fat dairy products, and lean protein foods contain the nutrients you need without too many calories. Decrease your intake of foods high in solid fats, added sugars, and salt. Get information about a proper diet from your caregiver, if necessary.  Regular physical exercise is one of the most important things you can do for your health. Most adults should get at least 150 minutes of moderate-intensity exercise (any activity that increases your heart rate and causes you to sweat) each week. In addition, most adults need muscle-strengthening exercises on 2 or more days a week.   Maintain a healthy weight. The body mass index (BMI) is a screening tool to identify possible weight problems. It provides an estimate of body fat based on height and weight. Your caregiver can help determine your BMI, and can  help you achieve or maintain a healthy weight. For adults 20 years and older:  A BMI below 18.5 is considered underweight.  A BMI of 18.5 to 24.9 is normal.  A BMI of 25 to 29.9 is considered overweight.  A BMI of 30 and above is considered obese. Muscle Cramps and Spasms Muscle cramps and spasms occur when a muscle or muscles tighten and you have no control over this tightening (involuntary muscle contraction). They are a common problem and can develop in any muscle. The most common place is in the calf muscles of the leg. Both muscle cramps and muscle spasms are involuntary muscle contractions, but they also have differences:   Muscle cramps are sporadic and painful. They may last a few seconds to a quarter of an hour. Muscle cramps are often more forceful and last longer than muscle spasms.  Muscle spasms may or may not be painful. They may also last just a few seconds or much longer. CAUSES  It is uncommon for cramps or spasms to be due to a serious underlying problem. In many cases, the cause of cramps or spasms is unknown. Some common causes are:   Overexertion.   Overuse from repetitive motions (doing the same thing over and over).   Remaining in a certain position for a long period of time.   Improper preparation, form, or technique while performing a sport or activity.   Dehydration.   Injury.   Side effects of some medicines.   Abnormally low levels of the  salts and ions in your blood (electrolytes), especially potassium and calcium. This could happen if you are taking water pills (diuretics) or you are pregnant.  Some underlying medical problems can make it more likely to develop cramps or spasms. These include, but are not limited to:   Diabetes.   Parkinson disease.   Hormone disorders, such as thyroid problems.   Alcohol abuse.   Diseases specific to muscles, joints, and bones.   Blood vessel disease where not enough blood is getting to the  muscles.  HOME CARE INSTRUCTIONS   Stay well hydrated. Drink enough water and fluids to keep your urine clear or pale yellow.  It may be helpful to massage, stretch, and relax the affected muscle.  For tight or tense muscles, use a warm towel, heating pad, or hot shower water directed to the affected area.  If you are sore or have pain after a cramp or spasm, applying ice to the affected area may relieve discomfort.  Put ice in a plastic bag.  Place a towel between your skin and the bag.  Leave the ice on for 15-20 minutes, 03-04 times a day.  Medicines used to treat a known cause of cramps or spasms may help reduce their frequency or severity. Only take over-the-counter or prescription medicines as directed by your caregiver. SEEK MEDICAL CARE IF:  Your cramps or spasms get more severe, more frequent, or do not improve over time.  MAKE SURE YOU:   Understand these instructions.  Will watch your condition.  Will get help right away if you are not doing well or get worse.   This information is not intended to replace advice given to you by your health care provider. Make sure you discuss any questions you have with your health care provider.   Document Released: 10/03/2001 Document Revised: 08/08/2012 Document Reviewed: 03/30/2012 Elsevier Interactive Patient Education [employer]2016 Elsevier Inc.

## 2015-08-21 NOTE — Assessment & Plan Note (Addendum)
Pt has experienced neck pain ongoing for 2-3 yrs.  She states it recently became worse.  She reports some associated weakness and paresthesias.  She states the pain is sharp and comes and goes.  She has difficulty with neck movement to the left.  No fever/chills.  She has not taken any meds for this due to her hepatitis.   -will try flexeril hs PRN -consider referral to PT -may also try a warm compress  -would avoid ibuprofen in setting of chronic prednisone use and cirrhosis

## 2015-09-02 ENCOUNTER — Other Ambulatory Visit: Payer: Self-pay | Admitting: Internal Medicine

## 2015-09-18 ENCOUNTER — Ambulatory Visit: Payer: Medicaid Other | Attending: Family Medicine | Admitting: Physical Therapy

## 2015-09-18 NOTE — Addendum Note (Signed)
Addended by: Neomia DearPOWERS, Gen Clagg E on: 09/18/2015 07:48 PM   Modules accepted: Orders

## 2015-09-30 ENCOUNTER — Telehealth: Payer: Self-pay | Admitting: Internal Medicine

## 2015-09-30 NOTE — Telephone Encounter (Signed)
APT. REMINDER CALL, LMTCB °

## 2015-10-01 ENCOUNTER — Encounter: Payer: Self-pay | Admitting: Internal Medicine

## 2015-10-01 ENCOUNTER — Encounter: Payer: Medicaid Other | Admitting: Internal Medicine

## 2015-10-09 ENCOUNTER — Encounter: Payer: Self-pay | Admitting: *Deleted

## 2015-10-11 ENCOUNTER — Other Ambulatory Visit: Payer: Self-pay | Admitting: Internal Medicine

## 2015-11-26 ENCOUNTER — Other Ambulatory Visit: Payer: Self-pay | Admitting: Internal Medicine

## 2015-11-26 MED ORDER — OMEPRAZOLE 20 MG PO CPDR: DELAYED_RELEASE_CAPSULE | ORAL | 6 refills | Status: DC

## 2015-11-26 NOTE — Telephone Encounter (Signed)
omeprazole (PRILOSEC) 20 MG capsule [facility]

## 2015-11-28 ENCOUNTER — Other Ambulatory Visit: Payer: Self-pay | Admitting: Internal Medicine

## 2015-11-28 DIAGNOSIS — I10 Essential (primary) hypertension: Secondary | ICD-10-CM

## 2015-11-28 NOTE — Telephone Encounter (Signed)
omeprazole (PRILOSEC) 20 MG capsule °[facility] °

## 2015-11-28 NOTE — Telephone Encounter (Signed)
done

## 2015-12-02 NOTE — Telephone Encounter (Signed)
Pt states the med is not at the pharmacy.

## 2015-12-03 NOTE — Telephone Encounter (Signed)
Pt calling about rx again

## 2015-12-03 NOTE — Telephone Encounter (Signed)
Confirmed w/ pharm, notified pt

## 2015-12-09 ENCOUNTER — Other Ambulatory Visit: Payer: Self-pay | Admitting: Student in an Organized Health Care Education/Training Program

## 2015-12-10 NOTE — Telephone Encounter (Signed)
I would like patient to come in for lab work. She will need a BMP as I'm concerned she may have lab abnormalities. It has been since February that she has had blood work and before re-prescribing this medication it would be appropriate for her to have repeat labs.

## 2015-12-18 NOTE — Telephone Encounter (Signed)
Call from [facility] - requesting Dyazide refill; informed pt needs lab work - stated they will call the pt. Can you put in lab orders? And I will f/u and schedule lab appt.

## 2015-12-19 NOTE — Telephone Encounter (Signed)
I placed an order for her to get a basic metabolic panel.   Once we see her lab work is normal we can refill this medication

## 2015-12-19 NOTE — Telephone Encounter (Signed)
Called pt to scheduled lab appt - no answer; left message.

## 2015-12-20 ENCOUNTER — Other Ambulatory Visit: Payer: Medicaid Other

## 2015-12-23 NOTE — Telephone Encounter (Signed)
Had lab appt 8/25 - no show. She does OV scheduled 9/7.

## 2015-12-24 NOTE — Telephone Encounter (Signed)
I will call her again. She really needs lab work before I refill this medication. I think there is still a standing order in place for her.

## 2015-12-25 ENCOUNTER — Other Ambulatory Visit (INDEPENDENT_AMBULATORY_CARE_PROVIDER_SITE_OTHER): Payer: Medicaid Other

## 2015-12-25 DIAGNOSIS — I10 Essential (primary) hypertension: Secondary | ICD-10-CM

## 2015-12-25 NOTE — Telephone Encounter (Signed)
Pt here today for labs.

## 2015-12-26 LAB — BMP8+ANION GAP
Anion Gap: 14 mmol/L (ref 10.0–18.0)
BUN / CREAT RATIO: 15 (ref 9–23)
BUN: 13 mg/dL (ref 6–24)
CALCIUM: 9 mg/dL (ref 8.7–10.2)
CHLORIDE: 104 mmol/L (ref 96–106)
CO2: 26 mmol/L (ref 18–29)
CREATININE: 0.84 mg/dL (ref 0.57–1.00)
GFR, EST AFRICAN AMERICAN: 94 mL/min/{1.73_m2} (ref >59)
GFR, EST NON AFRICAN AMERICAN: 81 mL/min/{1.73_m2} (ref >59)
Glucose: 75 mg/dL (ref 65–99)
Potassium: 3.5 mmol/L (ref 3.5–5.2)
Sodium: 144 mmol/L (ref 134–144)

## 2015-12-26 MED ORDER — TRIAMTERENE-HCTZ 37.5-25 MG PO CAPS: 1.0000 | ORAL_CAPSULE | Freq: Every day | ORAL | 0 refills | Status: DC

## 2015-12-26 NOTE — Telephone Encounter (Signed)
Patient had lab work on 12/25/2015 which showed a potassium of 3.5. I refilled her antihypertensive medications at this time.

## 2015-12-26 NOTE — Telephone Encounter (Signed)
I have refilled this patient's medication. Her labs were within normal limits I want her to continue taking this medication.

## 2015-12-26 NOTE — Telephone Encounter (Signed)
Pt called to about Dyazide refill - no answer; left message.

## 2016-01-02 ENCOUNTER — Encounter: Payer: Medicaid Other | Admitting: Internal Medicine

## 2016-01-08 ENCOUNTER — Other Ambulatory Visit: Payer: Self-pay | Admitting: Student in an Organized Health Care Education/Training Program

## 2016-02-12 ENCOUNTER — Telehealth: Payer: Self-pay | Admitting: Internal Medicine

## 2016-02-12 NOTE — Telephone Encounter (Signed)
AT. REMINDER CALL, LMTCB °

## 2016-02-13 ENCOUNTER — Encounter: Payer: Medicaid Other | Admitting: Internal Medicine

## 2016-02-13 ENCOUNTER — Encounter: Payer: Self-pay | Admitting: Internal Medicine

## 2016-02-23 IMAGING — CT CT HEAD W/O CM
1 of 2 series · 16 of 30 positions shown, 20 images · non-contrast
Comparison: 12/17/2010

CLINICAL DATA: Frontal headache, dizziness and photosensitivity
since [REDACTED].

EXAM:
CT HEAD WITHOUT CONTRAST
TECHNIQUE: Contiguous axial images were obtained from the base of the skull
through the vertex without intravenous contrast.

[Series 3: head 2.0 h70h · axial · 0.41mm/px · z∈[-22,+122]mm · 16 of 80 slices shown, 20 images]
[im 4/80  brain]
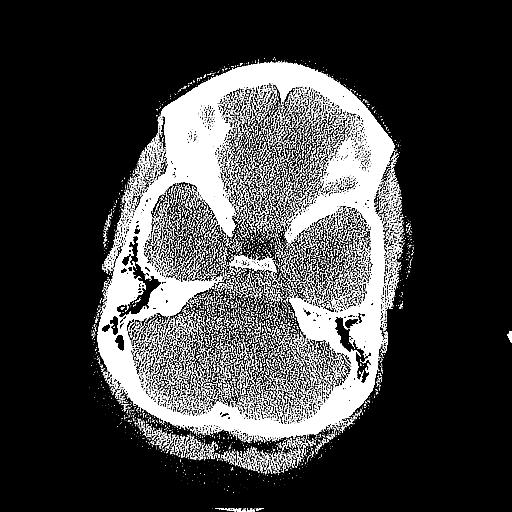
[im 4/80  bone]
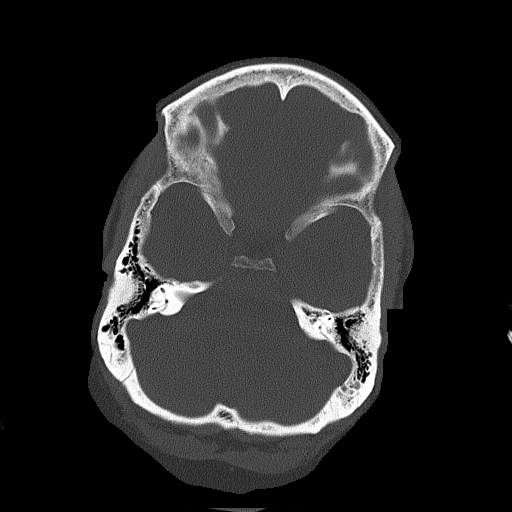
[im 8/80  brain]
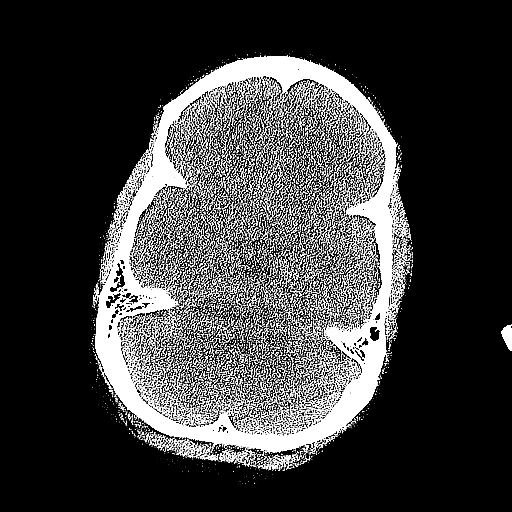
[im 16/80  brain]
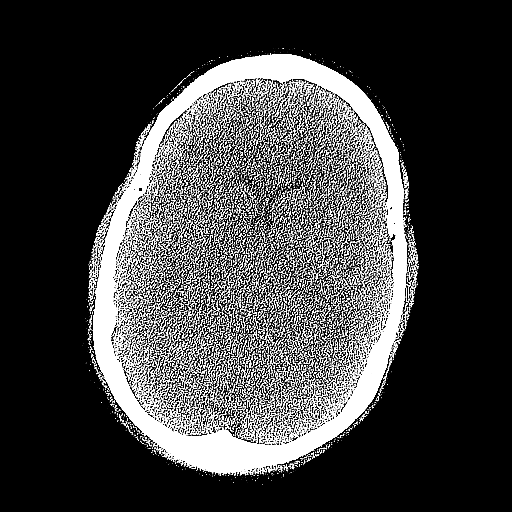
[im 19/80  brain]
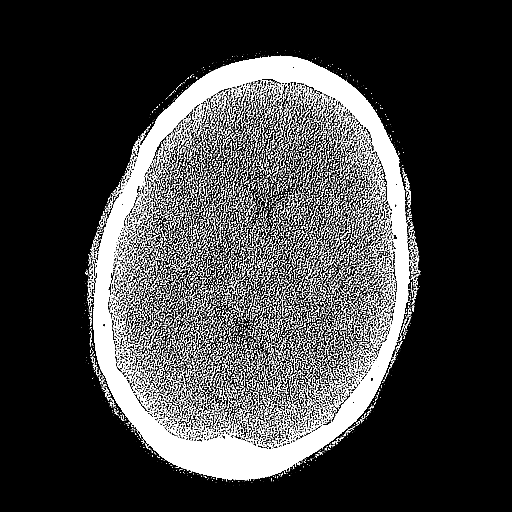
[im 23/80  brain]
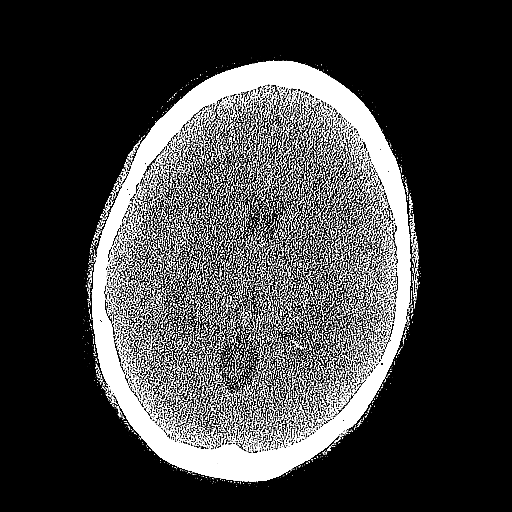
[im 23/80  bone]
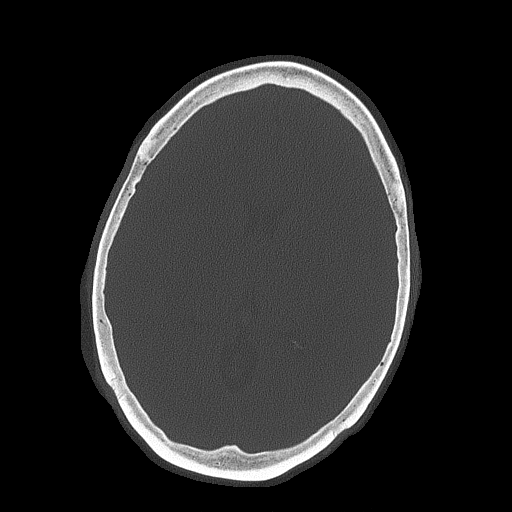
[im 27/80  brain]
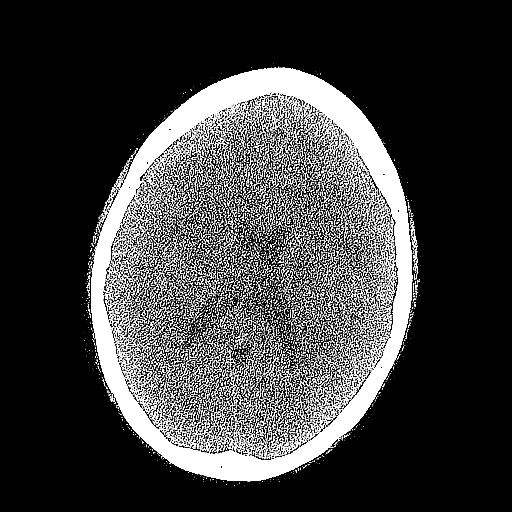
[im 34/80  brain]
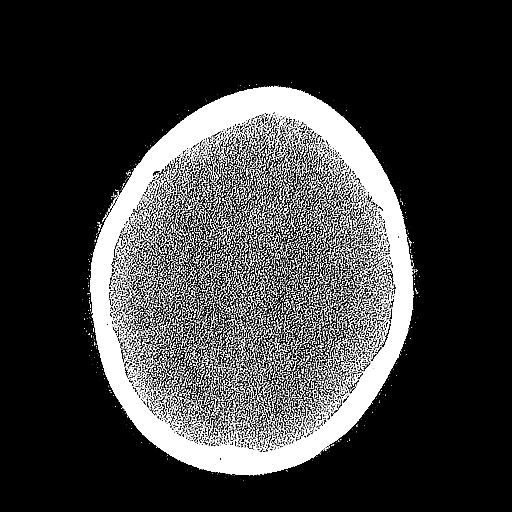
[im 38/80  brain]
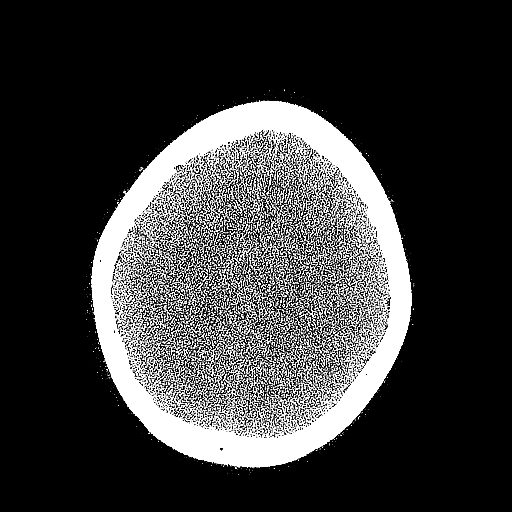
[im 42/80  brain]
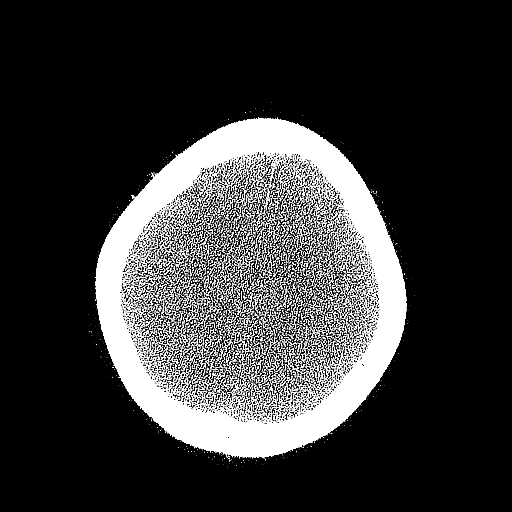
[im 42/80  bone]
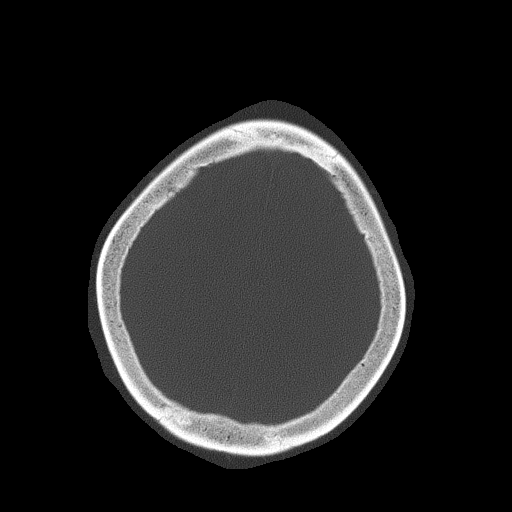
[im 46/80  brain]
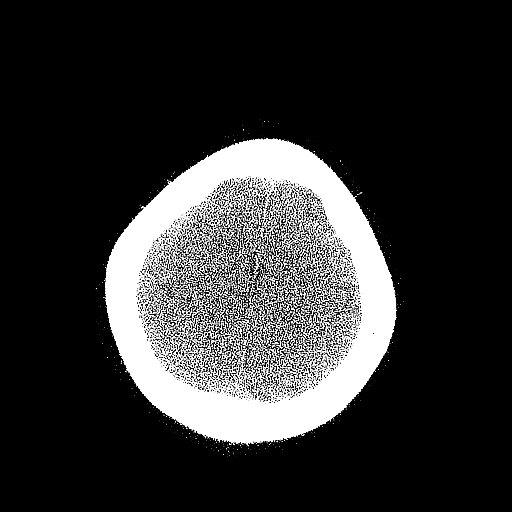
[im 53/80  brain]
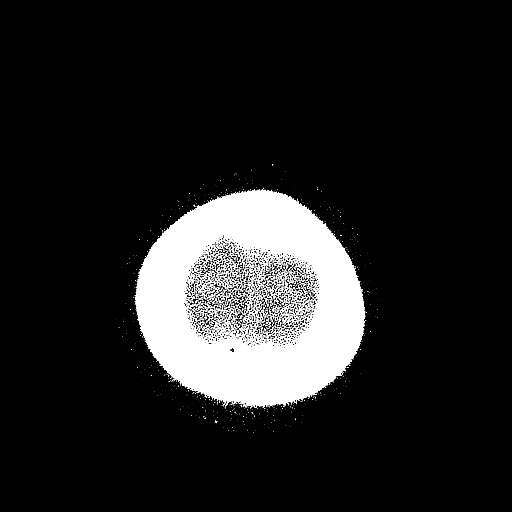
[im 57/80  brain]
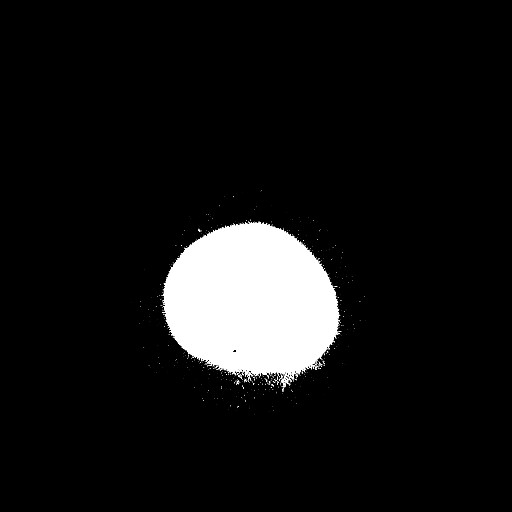
[im 61/80  brain]
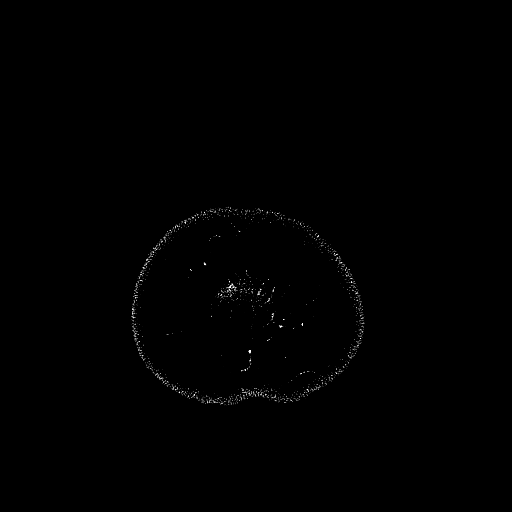
[im 61/80  bone]
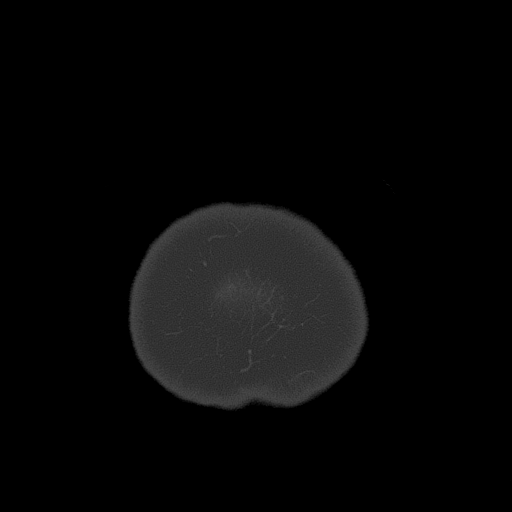
[im 64/80  brain]
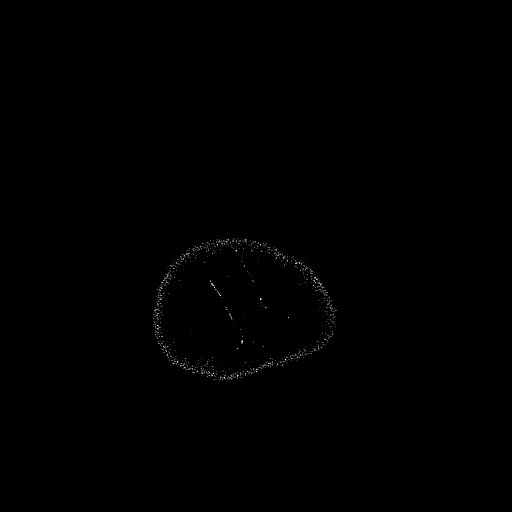
[im 72/80  brain]
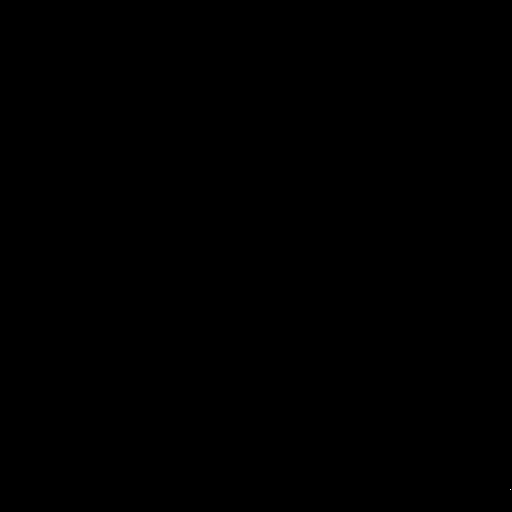
[im 76/80  brain]
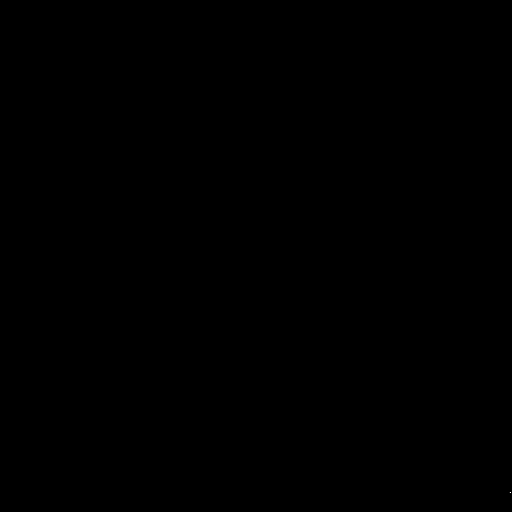

[16 of 30 positions shown; findings below may reference images not displayed]

FINDINGS: The ventricles are normal in size and configuration. No extra-axial
fluid collections are identified. The gray-white differentiation is
normal. No CT findings for acute intracranial process such as
hemorrhage or infarction. No mass lesions. The brainstem and
cerebellum are grossly normal.

The bony structures are intact. The paranasal sinuses and mastoid
air cells are clear. The globes are intact.
IMPRESSION: Normal head CT

## 2016-02-24 LAB — PROTIME-INR

## 2016-03-03 ENCOUNTER — Other Ambulatory Visit: Payer: Self-pay | Admitting: *Deleted

## 2016-03-05 MED ORDER — TRIAMTERENE-HCTZ 37.5-25 MG PO CAPS: 1.0000 | ORAL_CAPSULE | Freq: Every day | ORAL | 0 refills | Status: AC

## 2016-03-10 ENCOUNTER — Other Ambulatory Visit: Payer: Self-pay | Admitting: *Deleted

## 2016-03-11 MED ORDER — OMEPRAZOLE 20 MG PO CPDR: DELAYED_RELEASE_CAPSULE | ORAL | 0 refills | Status: AC

## 2016-05-08 ENCOUNTER — Encounter: Payer: Self-pay | Admitting: Gastroenterology

## 2017-01-07 ENCOUNTER — Encounter: Payer: Medicaid Other | Admitting: Internal Medicine

## 2017-11-18 ENCOUNTER — Emergency Department (HOSPITAL_COMMUNITY): Payer: No Typology Code available for payment source

## 2017-11-18 ENCOUNTER — Encounter (HOSPITAL_COMMUNITY): Payer: Self-pay | Admitting: Emergency Medicine

## 2017-11-18 ENCOUNTER — Emergency Department (HOSPITAL_COMMUNITY)
Admission: EM | Admit: 2017-11-18 | Discharge: 2017-11-18 | Disposition: A | Discharge status: Home / Self Care | Payer: No Typology Code available for payment source | Attending: Emergency Medicine | Admitting: Emergency Medicine

## 2017-11-18 DIAGNOSIS — Z79899 Other long term (current) drug therapy: Secondary | ICD-10-CM | POA: Insufficient documentation

## 2017-11-18 DIAGNOSIS — E063 Autoimmune thyroiditis: Secondary | ICD-10-CM | POA: Diagnosis not present

## 2017-11-18 DIAGNOSIS — S199XXA Unspecified injury of neck, initial encounter: Secondary | ICD-10-CM | POA: Diagnosis present

## 2017-11-18 DIAGNOSIS — S161XXA Strain of muscle, fascia and tendon at neck level, initial encounter: Secondary | ICD-10-CM | POA: Diagnosis not present

## 2017-11-18 DIAGNOSIS — I10 Essential (primary) hypertension: Secondary | ICD-10-CM | POA: Insufficient documentation

## 2017-11-18 DIAGNOSIS — F17210 Nicotine dependence, cigarettes, uncomplicated: Secondary | ICD-10-CM | POA: Insufficient documentation

## 2017-11-18 DIAGNOSIS — Y999 Unspecified external cause status: Secondary | ICD-10-CM | POA: Diagnosis not present

## 2017-11-18 DIAGNOSIS — Y92410 Unspecified street and highway as the place of occurrence of the external cause: Secondary | ICD-10-CM | POA: Insufficient documentation

## 2017-11-18 DIAGNOSIS — Y939 Activity, unspecified: Secondary | ICD-10-CM | POA: Insufficient documentation

## 2017-11-18 MED ORDER — IBUPROFEN 600 MG PO TABS: 600.0000 mg | ORAL_TABLET | Freq: Four times a day (QID) | ORAL | 0 refills | Status: AC | PRN
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
## 2017-11-18 NOTE — Discharge Instructions (Addendum)
See your Physician for recheck next week  °

## 2017-11-21 ENCOUNTER — Encounter (HOSPITAL_COMMUNITY): Payer: Self-pay

## 2017-11-21 ENCOUNTER — Other Ambulatory Visit: Payer: Self-pay

## 2017-11-21 ENCOUNTER — Emergency Department (HOSPITAL_COMMUNITY)
Admission: EM | Admit: 2017-11-21 | Discharge: 2017-11-21 | Disposition: A | Discharge status: Home / Self Care | Payer: No Typology Code available for payment source | Attending: Emergency Medicine | Admitting: Emergency Medicine

## 2017-11-21 ENCOUNTER — Emergency Department (HOSPITAL_COMMUNITY): Payer: No Typology Code available for payment source

## 2017-11-21 DIAGNOSIS — Z91040 Latex allergy status: Secondary | ICD-10-CM | POA: Insufficient documentation

## 2017-11-21 DIAGNOSIS — I10 Essential (primary) hypertension: Secondary | ICD-10-CM | POA: Diagnosis not present

## 2017-11-21 DIAGNOSIS — F17210 Nicotine dependence, cigarettes, uncomplicated: Secondary | ICD-10-CM | POA: Insufficient documentation

## 2017-11-21 DIAGNOSIS — M545 Low back pain, unspecified: Secondary | ICD-10-CM

## 2017-11-21 DIAGNOSIS — Z79899 Other long term (current) drug therapy: Secondary | ICD-10-CM | POA: Diagnosis not present

## 2017-11-21 DIAGNOSIS — R51 Headache: Secondary | ICD-10-CM | POA: Diagnosis not present

## 2017-11-21 DIAGNOSIS — R519 Headache, unspecified: Secondary | ICD-10-CM
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
## 2017-11-21 NOTE — ED Notes (Signed)
Patient verbalizes understanding of discharge instructions. Opportunity for questioning and answers were provided. Armband removed by staff, pt discharged from ED.  

## 2017-11-21 NOTE — Discharge Instructions (Addendum)
Take ibuprofen as directed for pain. Apply warm compresses to your low back for 20 minutes at a time. Follow up with your primary care provider for recheck and referral if needed. Return to ER for worsening or concerning symptoms.

## 2017-11-21 NOTE — ED Notes (Signed)
Patient transported to CT 

## 2022-12-01 DIAGNOSIS — R7302 Impaired glucose tolerance (oral): Secondary | ICD-10-CM | POA: Diagnosis not present

## 2022-12-01 DIAGNOSIS — Z9225 Personal history of immunosupression therapy: Secondary | ICD-10-CM | POA: Diagnosis not present

## 2022-12-01 DIAGNOSIS — R634 Abnormal weight loss: Secondary | ICD-10-CM | POA: Diagnosis not present

## 2022-12-01 DIAGNOSIS — I10 Essential (primary) hypertension: Secondary | ICD-10-CM | POA: Diagnosis not present

## 2022-12-01 DIAGNOSIS — Z171 Estrogen receptor negative status [ER-]: Secondary | ICD-10-CM | POA: Diagnosis not present

## 2022-12-01 DIAGNOSIS — C50812 Malignant neoplasm of overlapping sites of left female breast: Secondary | ICD-10-CM | POA: Diagnosis not present

## 2022-12-01 DIAGNOSIS — E063 Autoimmune thyroiditis: Secondary | ICD-10-CM | POA: Diagnosis not present

## 2022-12-01 DIAGNOSIS — K754 Autoimmune hepatitis: Secondary | ICD-10-CM | POA: Diagnosis not present

## 2022-12-01 DIAGNOSIS — K3189 Other diseases of stomach and duodenum: Secondary | ICD-10-CM | POA: Diagnosis not present

## 2022-12-01 DIAGNOSIS — K7460 Unspecified cirrhosis of liver: Secondary | ICD-10-CM | POA: Diagnosis not present

## 2022-12-01 DIAGNOSIS — E669 Obesity, unspecified: Secondary | ICD-10-CM | POA: Diagnosis not present

## 2023-01-20 DIAGNOSIS — T451X5A Adverse effect of antineoplastic and immunosuppressive drugs, initial encounter: Secondary | ICD-10-CM | POA: Diagnosis not present

## 2023-01-20 DIAGNOSIS — C50812 Malignant neoplasm of overlapping sites of left female breast: Secondary | ICD-10-CM | POA: Diagnosis not present

## 2023-01-20 DIAGNOSIS — Z171 Estrogen receptor negative status [ER-]: Secondary | ICD-10-CM | POA: Diagnosis not present

## 2023-01-20 DIAGNOSIS — G620 Drug-induced polyneuropathy: Secondary | ICD-10-CM | POA: Diagnosis not present

## 2023-02-10 DIAGNOSIS — I10 Essential (primary) hypertension: Secondary | ICD-10-CM | POA: Diagnosis not present

## 2023-02-10 DIAGNOSIS — S46812A Strain of other muscles, fascia and tendons at shoulder and upper arm level, left arm, initial encounter: Secondary | ICD-10-CM | POA: Diagnosis not present

## 2023-03-04 DIAGNOSIS — K754 Autoimmune hepatitis: Secondary | ICD-10-CM | POA: Diagnosis not present

## 2023-03-04 DIAGNOSIS — K7460 Unspecified cirrhosis of liver: Secondary | ICD-10-CM | POA: Diagnosis not present
